# Patient Record
Sex: Female | Born: 1970
Health system: Southern US, Community
[De-identification: ages and names within clinical notes are randomized; demographics above are authoritative.]

## PROBLEM LIST (undated history)

## (undated) DIAGNOSIS — E785 Hyperlipidemia, unspecified: Secondary | ICD-10-CM

## (undated) DIAGNOSIS — I10 Essential (primary) hypertension: Secondary | ICD-10-CM

## (undated) DIAGNOSIS — I340 Nonrheumatic mitral (valve) insufficiency: Secondary | ICD-10-CM

## (undated) DIAGNOSIS — O1490 Unspecified pre-eclampsia, unspecified trimester: Secondary | ICD-10-CM

## (undated) DIAGNOSIS — I639 Cerebral infarction, unspecified: Secondary | ICD-10-CM

## (undated) DIAGNOSIS — I429 Cardiomyopathy, unspecified: Secondary | ICD-10-CM

## (undated) HISTORY — DX: Cardiomyopathy, unspecified: I42.9

## (undated) HISTORY — DX: Nonrheumatic mitral (valve) insufficiency: I34.0

## (undated) HISTORY — DX: Hyperlipidemia, unspecified: E78.5

## (undated) HISTORY — DX: Essential (primary) hypertension: I10

## (undated) HISTORY — DX: Cerebral infarction, unspecified: I63.9

---

## 1998-12-14 ENCOUNTER — Emergency Department (HOSPITAL_COMMUNITY): Admission: EM | Admit: 1998-12-14 | Discharge: 1998-12-14 | Payer: Self-pay | Admitting: Emergency Medicine

## 1998-12-15 ENCOUNTER — Emergency Department (HOSPITAL_COMMUNITY): Admission: EM | Admit: 1998-12-15 | Discharge: 1998-12-15 | Payer: Self-pay | Admitting: Emergency Medicine

## 2000-10-04 ENCOUNTER — Inpatient Hospital Stay (HOSPITAL_COMMUNITY): Admission: AD | Admit: 2000-10-04 | Discharge: 2000-10-04 | Payer: Self-pay | Admitting: *Deleted

## 2001-01-20 ENCOUNTER — Encounter: Payer: Self-pay | Admitting: *Deleted

## 2001-01-20 ENCOUNTER — Ambulatory Visit (HOSPITAL_COMMUNITY): Admission: RE | Admit: 2001-01-20 | Discharge: 2001-01-20 | Payer: Self-pay | Admitting: *Deleted

## 2001-05-28 ENCOUNTER — Inpatient Hospital Stay (HOSPITAL_COMMUNITY): Admission: AD | Admit: 2001-05-28 | Discharge: 2001-05-30 | Payer: Self-pay | Admitting: *Deleted

## 2001-05-28 ENCOUNTER — Encounter (INDEPENDENT_AMBULATORY_CARE_PROVIDER_SITE_OTHER): Payer: Self-pay

## 2004-06-22 ENCOUNTER — Emergency Department (HOSPITAL_COMMUNITY): Admission: EM | Admit: 2004-06-22 | Discharge: 2004-06-22 | Payer: Self-pay | Admitting: Emergency Medicine

## 2004-08-22 ENCOUNTER — Ambulatory Visit: Payer: Self-pay | Admitting: *Deleted

## 2009-10-08 ENCOUNTER — Emergency Department (HOSPITAL_COMMUNITY): Admission: EM | Admit: 2009-10-08 | Discharge: 2009-10-08 | Payer: Self-pay | Admitting: Emergency Medicine

## 2011-04-25 NOTE — H&P (Signed)
[facility] ([facility])  Patient:    Catherine Gross, Catherine Gross                         MRN: [id number] Adm. Date:  [id number] Disc. Date: [id number] Attending:  Deniece Ree                         History and Physical  HISTORY:                      The patient is a 40 year old multiparous female who is status post vaginal delivery and desires a permanent sterilization. The patient has discussed this permanent procedure throughout her pregnancy and is very sure this is what she desires. She understands the different types of contraceptives available. She also understands that this procedure is intended to be permanent, however, it cannot be guaranteed.  PHYSICAL EXAMINATION:  GENERAL:                      Well-developed, well-nourished postpartum female in no acute distress.  HEENT:                        Within normal limits.  NECK:                         Supple.  BREASTS:                      Without masses, tenderness, or discharge.  LUNGS:                        Clear to percussion and auscultation.  HEART:                        Normal sinus rhythm without murmur, rub, or gallop.  ABDOMEN:                      Benign with uterine fundus extending to the umbilicus.  EXTREMITIES:                  Within normal limits.  NEUROLOGIC:                   Within normal limits.  PELVIC:                       Not done.  DIAGNOSIS:                    Multiparity, desirous of a permanent sterilization.  PLAN:                         Bilateral tubal ligation using a modified Pomeroy procedure. DD:  05/29/01 TD:  05/30/01 Job: [id number]

## 2011-04-25 NOTE — Op Note (Signed)
[facility]  Patient:    Catherine Gross, Catherine Gross                         MRN: [id number] Proc. Date: 05/29/01 Adm. Date:  [id number] Disc. Date: [id number] Attending:  Deniece Ree                           Operative Report  PREOPERATIVE DIAGNOSIS:       Multiparity, desirous of permanent sterilization.  POSTOPERATIVE DIAGNOSIS:      Multiparity, desirous of permanent sterilization.  OPERATION:                    Bilateral tubal ligation using a modified Pomeroy procedure.  SURGEON:                      Deniece Ree, M.D.  ANESTHESIA:                   Epidural.  ESTIMATED BLOOD LOSS:         Less than 25 cc.  COMPLICATIONS:                None.  DISPOSITION:                  The patient tolerated the procedure well and returned to the recovery room in satisfactory condition.  DESCRIPTION OF PROCEDURE:     The patient was taken to the operating room, prepped and draped in the usual fashion for postpartum sterilization. A subumbilical incision was then made. This was carried down through the peritoneum and fascia. At this point, with some manipulation, the right tube was identified, grasped with the Babcock clamp, followed out until the fimbriated end could be identified. The portion of the tube was then knuckled up and utilizing a 0 plain catgut, ligated in a routine fashion without any problems. This was done likewise on the opposite side. Both tubal segments were then labeled and sent to pathology. Both tubal stump areas were then cauterized with the use of a cautery. At this point, hemostasis was present, sponge and needle count was correct x 2. The abdominoperitoneum and fascia was then closed using #1 chromic in a running stitch followed by closure of the skin using a 4-0 Vicryl in a subcuticular stitch. The procedure was terminated. The patient tolerated the procedure well and returned to the recovery room in satisfactory condition. DD:   05/29/01 TD:  05/30/01 Job: [id number]

## 2012-04-19 DIAGNOSIS — N183 Chronic kidney disease, stage 3 (moderate): Secondary | ICD-10-CM

## 2012-04-19 DIAGNOSIS — E1065 Type 1 diabetes mellitus with hyperglycemia: Secondary | ICD-10-CM

## 2012-04-19 DIAGNOSIS — I5031 Acute diastolic (congestive) heart failure: Secondary | ICD-10-CM

## 2012-04-19 DIAGNOSIS — E1029 Type 1 diabetes mellitus with other diabetic kidney complication: Secondary | ICD-10-CM

## 2012-04-19 DIAGNOSIS — J209 Acute bronchitis, unspecified: Secondary | ICD-10-CM

## 2014-09-02 ENCOUNTER — Emergency Department (HOSPITAL_BASED_OUTPATIENT_CLINIC_OR_DEPARTMENT_OTHER)
Admission: EM | Admit: 2014-09-02 | Discharge: 2014-09-02 | Disposition: A | Discharge status: Home / Self Care | Payer: Self-pay | Attending: Emergency Medicine | Admitting: Emergency Medicine

## 2014-09-02 ENCOUNTER — Encounter (HOSPITAL_BASED_OUTPATIENT_CLINIC_OR_DEPARTMENT_OTHER): Payer: Self-pay | Admitting: Emergency Medicine

## 2014-09-02 DIAGNOSIS — F17200 Nicotine dependence, unspecified, uncomplicated: Secondary | ICD-10-CM | POA: Insufficient documentation

## 2014-09-02 DIAGNOSIS — R220 Localized swelling, mass and lump, head: Secondary | ICD-10-CM | POA: Insufficient documentation

## 2014-09-02 DIAGNOSIS — K0889 Other specified disorders of teeth and supporting structures: Secondary | ICD-10-CM

## 2014-09-02 DIAGNOSIS — R221 Localized swelling, mass and lump, neck: Secondary | ICD-10-CM

## 2014-09-02 DIAGNOSIS — K089 Disorder of teeth and supporting structures, unspecified: Secondary | ICD-10-CM | POA: Insufficient documentation

## 2014-09-02 MED ORDER — PENICILLIN V POTASSIUM 500 MG PO TABS: 500.0000 mg | ORAL_TABLET | Freq: Four times a day (QID) | ORAL | Status: AC

## 2014-09-02 MED ORDER — IBUPROFEN 600 MG PO TABS: 600.0000 mg | ORAL_TABLET | Freq: Four times a day (QID) | ORAL | Status: DC | PRN

## 2014-09-02 MED ORDER — HYDROCODONE-ACETAMINOPHEN 5-325 MG PO TABS: 1.0000 | ORAL_TABLET | Freq: Four times a day (QID) | ORAL | Status: DC | PRN

## 2014-09-02 MED ORDER — IBUPROFEN 400 MG PO TABS
600.0000 mg | ORAL_TABLET | Freq: Once | ORAL | Status: AC
  Administered 2014-09-02: 600 mg via ORAL
  Filled 2014-09-02 (×2): qty 1

## 2014-09-02 NOTE — Discharge Instructions (Signed)
You appear to have peridontal disease and an exposed nerve root.  You need to follow-up with your dentist as soon as possible.  He will be given pain medication that contains Tylenol. You should not combine with other Tylenol products.  You also be given an antibiotic.  Dental Pain A tooth ache may be caused by cavities (tooth decay). Cavities expose the nerve of the tooth to air and hot or cold temperatures. It may come from an infection or abscess (also called a boil or furuncle) around your tooth. It is also often caused by dental caries (tooth decay). This causes the pain you are having. DIAGNOSIS  Your caregiver can diagnose this problem by exam. TREATMENT   If caused by an infection, it may be treated with medications which kill germs (antibiotics) and pain medications as prescribed by your caregiver. Take medications as directed.  Only take over-the-counter or prescription medicines for pain, discomfort, or fever as directed by your caregiver.  Whether the tooth ache today is caused by infection or dental disease, you should see your dentist as soon as possible for further care. SEEK MEDICAL CARE IF: The exam and treatment you received today has been provided on an emergency basis only. This is not a substitute for complete medical or dental care. If your problem worsens or new problems (symptoms) appear, and you are unable to meet with your dentist, call or return to this location. SEEK IMMEDIATE MEDICAL CARE IF:   You have a fever.  You develop redness and swelling of your face, jaw, or neck.  You are unable to open your mouth.  You have severe pain uncontrolled by pain medicine. MAKE SURE YOU:   Understand these instructions.  Will watch your condition.  Will get help right away if you are not doing well or get worse. Document Released: 11/24/2005 Document Revised: 02/16/2012 Document Reviewed: 07/12/2008 [facility] Patient Information 2015 [place], [place]. This information is  not intended to replace advice given to you by your health care provider. Make sure you discuss any questions you have with your health care provider.

## 2014-09-02 NOTE — ED Notes (Signed)
Pt presents to ED with complaints of left sided facial swelling and dental pain.

## 2014-09-02 NOTE — ED Provider Notes (Signed)
CSN: [id number]     Arrival date & time 09/02/14  1110 History   First MD Initiated Contact with Patient 09/02/14 1127     Chief Complaint  Patient presents with  . Facial Swelling  . Dental Pain     (Consider location/radiation/quality/duration/timing/severity/associated sxs/prior Treatment) HPI  This is a 43 year old female with history smoking and poor dentition requiring extraction of all of her lower teeth who presents with toothache. Patient reports to 3 day history of worsening dental pain. She states that she woke up this morning and had left facial swelling. She denies any fevers or systemic symptoms. She's been taking Tylenol at home which she reports improved her pain.  Currently she reports that she is "okay."  She was concerned for abscess given facial swelling. She denies any sore throat or difficulty breathing.  History reviewed. No pertinent past medical history. History reviewed. No pertinent past surgical history. No family history on file. History  Substance Use Topics  . Smoking status: Current Every Day Smoker  . Smokeless tobacco: Not on file  . Alcohol Use: Not on file   OB History   Grav Para Term Preterm Abortions TAB SAB Ect Mult Living                 Review of Systems  Constitutional: Negative for fever.  HENT: Positive for dental problem. Negative for sore throat, trouble swallowing and voice change.   All other systems reviewed and are negative.     Allergies  Review of patient's allergies indicates no known allergies.  Home Medications   Prior to Admission medications   Medication Sig Start Date End Date Taking? Authorizing Provider  HYDROcodone-acetaminophen (NORCO/VICODIN) 5-325 MG per tablet Take 1 tablet by mouth every 6 (six) hours as needed for moderate pain or severe pain. 09/02/14   Shon Baton, MD  ibuprofen (ADVIL,MOTRIN) 600 MG tablet Take 1 tablet (600 mg total) by mouth every 6 (six) hours as needed for moderate pain.  09/02/14   Shon Baton, MD  penicillin v potassium (VEETID) 500 MG tablet Take 1 tablet (500 mg total) by mouth 4 (four) times daily. 09/02/14 09/09/14  Shon Baton, MD   BP 171/99  Pulse 102  Temp(Src) 98.9 F (37.2 C) (Oral)  Resp 18  Ht 5\' 5"  (1.651 m)  Wt 180 lb (81.647 kg)  BMI 29.95 kg/m2  SpO2 98%  LMP 08/26/2014 Physical Exam  Nursing note and vitals reviewed. Constitutional: She is oriented to person, place, and time. She appears well-developed and well-nourished.  HENT:  Head: Normocephalic and atraumatic.  Mouth/Throat: Oropharynx is clear and moist.  Mild swelling noted over the zygomatic region the face without overlying skin changes, peritonsillar disease noted over the upper, with extensive exposure of the nerve root of tooth 11 (left cuspid), no obvious abscess, no dentures over the lower gumline, no trismus, normal movement of tongue  Eyes: Pupils are equal, round, and reactive to light.  Neck: Neck supple.  Cardiovascular: Normal rate and regular rhythm.   Pulmonary/Chest: Effort normal. No respiratory distress.  Abdominal: Soft.  Lymphadenopathy:    She has no cervical adenopathy.  Neurological: She is alert and oriented to person, place, and time.  Skin: Skin is warm and dry.  Psychiatric: She has a normal mood and affect.    ED Course  Procedures (including critical care time) Labs Review Labs Reviewed - No data to display  Imaging Review No results found.   EKG Interpretation None  MDM   Final diagnoses:  Pain, dental    Patient presents with dental pain. No obvious abscess. Pain is well controlled at this time with over-the-counter pain medication. No evidence of Ludwig's or airway compromise. Patient has extensive.disease with exposure of the nerve root worse over tooth 11. Patient has attentive she can followup with. We'll place on penicillin. Encouraged patient to use ibuprofen for pain control. Will also give a short course of  Norco for breakthrough pain. Patient was instructed not to combine with other Tylenol containing products.  After history, exam, and medical workup I feel the patient has been appropriately medically screened and is safe for discharge home. Pertinent diagnoses were discussed with the patient. Patient was given return precautions.     Shon Baton, MD 09/02/14 1145

## 2015-03-21 ENCOUNTER — Inpatient Hospital Stay (HOSPITAL_COMMUNITY)
Admission: EM | Admit: 2015-03-21 | Discharge: 2015-03-30 | DRG: 291 | Disposition: A | Discharge status: Home / Self Care | Payer: Medicaid Other | Attending: Internal Medicine | Admitting: Internal Medicine

## 2015-03-21 ENCOUNTER — Encounter (HOSPITAL_COMMUNITY): Payer: Self-pay

## 2015-03-21 DIAGNOSIS — I5021 Acute systolic (congestive) heart failure: Secondary | ICD-10-CM

## 2015-03-21 DIAGNOSIS — I5043 Acute on chronic combined systolic (congestive) and diastolic (congestive) heart failure: Principal | ICD-10-CM | POA: Diagnosis present

## 2015-03-21 DIAGNOSIS — D649 Anemia, unspecified: Secondary | ICD-10-CM | POA: Insufficient documentation

## 2015-03-21 DIAGNOSIS — I272 Other secondary pulmonary hypertension: Secondary | ICD-10-CM | POA: Diagnosis present

## 2015-03-21 DIAGNOSIS — D509 Iron deficiency anemia, unspecified: Secondary | ICD-10-CM | POA: Diagnosis present

## 2015-03-21 DIAGNOSIS — I5041 Acute combined systolic (congestive) and diastolic (congestive) heart failure: Secondary | ICD-10-CM

## 2015-03-21 DIAGNOSIS — R05 Cough: Secondary | ICD-10-CM | POA: Diagnosis present

## 2015-03-21 DIAGNOSIS — I5031 Acute diastolic (congestive) heart failure: Secondary | ICD-10-CM

## 2015-03-21 DIAGNOSIS — E876 Hypokalemia: Secondary | ICD-10-CM | POA: Diagnosis present

## 2015-03-21 DIAGNOSIS — F17210 Nicotine dependence, cigarettes, uncomplicated: Secondary | ICD-10-CM | POA: Diagnosis present

## 2015-03-21 DIAGNOSIS — I10 Essential (primary) hypertension: Secondary | ICD-10-CM | POA: Diagnosis present

## 2015-03-21 DIAGNOSIS — I420 Dilated cardiomyopathy: Secondary | ICD-10-CM | POA: Diagnosis present

## 2015-03-21 DIAGNOSIS — J9601 Acute respiratory failure with hypoxia: Secondary | ICD-10-CM | POA: Diagnosis present

## 2015-03-21 DIAGNOSIS — T501X5A Adverse effect of loop [high-ceiling] diuretics, initial encounter: Secondary | ICD-10-CM | POA: Diagnosis present

## 2015-03-21 DIAGNOSIS — I340 Nonrheumatic mitral (valve) insufficiency: Secondary | ICD-10-CM | POA: Diagnosis present

## 2015-03-21 DIAGNOSIS — I509 Heart failure, unspecified: Secondary | ICD-10-CM

## 2015-03-21 DIAGNOSIS — R000 Tachycardia, unspecified: Secondary | ICD-10-CM | POA: Diagnosis present

## 2015-03-21 DIAGNOSIS — R059 Cough, unspecified: Secondary | ICD-10-CM | POA: Diagnosis present

## 2015-03-21 HISTORY — DX: Unspecified pre-eclampsia, unspecified trimester: O14.90

## 2015-03-21 NOTE — ED Notes (Signed)
Pt complains of a dry cough for a week, only been treated with OTC medications with no relief

## 2015-03-22 ENCOUNTER — Encounter (HOSPITAL_COMMUNITY): Payer: Self-pay | Admitting: Emergency Medicine

## 2015-03-22 ENCOUNTER — Emergency Department (HOSPITAL_COMMUNITY): Payer: Medicaid Other

## 2015-03-22 DIAGNOSIS — I5041 Acute combined systolic (congestive) and diastolic (congestive) heart failure: Secondary | ICD-10-CM | POA: Diagnosis not present

## 2015-03-22 DIAGNOSIS — F17210 Nicotine dependence, cigarettes, uncomplicated: Secondary | ICD-10-CM | POA: Diagnosis present

## 2015-03-22 DIAGNOSIS — D649 Anemia, unspecified: Secondary | ICD-10-CM | POA: Diagnosis not present

## 2015-03-22 DIAGNOSIS — I509 Heart failure, unspecified: Secondary | ICD-10-CM

## 2015-03-22 DIAGNOSIS — I340 Nonrheumatic mitral (valve) insufficiency: Secondary | ICD-10-CM | POA: Diagnosis present

## 2015-03-22 DIAGNOSIS — E876 Hypokalemia: Secondary | ICD-10-CM | POA: Diagnosis present

## 2015-03-22 DIAGNOSIS — I10 Essential (primary) hypertension: Secondary | ICD-10-CM

## 2015-03-22 DIAGNOSIS — D509 Iron deficiency anemia, unspecified: Secondary | ICD-10-CM | POA: Diagnosis present

## 2015-03-22 DIAGNOSIS — R05 Cough: Secondary | ICD-10-CM | POA: Diagnosis not present

## 2015-03-22 DIAGNOSIS — I5031 Acute diastolic (congestive) heart failure: Secondary | ICD-10-CM

## 2015-03-22 DIAGNOSIS — I429 Cardiomyopathy, unspecified: Secondary | ICD-10-CM | POA: Diagnosis not present

## 2015-03-22 DIAGNOSIS — I5043 Acute on chronic combined systolic (congestive) and diastolic (congestive) heart failure: Secondary | ICD-10-CM | POA: Diagnosis present

## 2015-03-22 DIAGNOSIS — I5021 Acute systolic (congestive) heart failure: Secondary | ICD-10-CM | POA: Diagnosis not present

## 2015-03-22 DIAGNOSIS — J9601 Acute respiratory failure with hypoxia: Secondary | ICD-10-CM | POA: Diagnosis not present

## 2015-03-22 DIAGNOSIS — R059 Cough, unspecified: Secondary | ICD-10-CM | POA: Diagnosis present

## 2015-03-22 DIAGNOSIS — I272 Other secondary pulmonary hypertension: Secondary | ICD-10-CM | POA: Diagnosis present

## 2015-03-22 DIAGNOSIS — R000 Tachycardia, unspecified: Secondary | ICD-10-CM | POA: Diagnosis present

## 2015-03-22 DIAGNOSIS — I420 Dilated cardiomyopathy: Secondary | ICD-10-CM | POA: Diagnosis present

## 2015-03-22 DIAGNOSIS — I471 Supraventricular tachycardia: Secondary | ICD-10-CM | POA: Diagnosis not present

## 2015-03-22 DIAGNOSIS — T501X5A Adverse effect of loop [high-ceiling] diuretics, initial encounter: Secondary | ICD-10-CM | POA: Diagnosis present

## 2015-03-22 LAB — CBC WITH DIFFERENTIAL/PLATELET
BASOS PCT: 0 % (ref 0–1)
Basophils Absolute: 0 10*3/uL (ref 0.0–0.1)
EOS ABS: 0.3 10*3/uL (ref 0.0–0.7)
Eosinophils Relative: 3 % (ref 0–5)
HCT: 34.4 % — ABNORMAL LOW (ref 36.0–46.0)
Hemoglobin: 10.9 g/dL — ABNORMAL LOW (ref 12.0–15.0)
Lymphocytes Relative: 26 % (ref 12–46)
Lymphs Abs: 2.4 10*3/uL (ref 0.7–4.0)
MCH: 27 pg (ref 26.0–34.0)
MCHC: 31.7 g/dL (ref 30.0–36.0)
MCV: 85.4 fL (ref 78.0–100.0)
MONO ABS: 0.5 10*3/uL (ref 0.1–1.0)
Monocytes Relative: 6 % (ref 3–12)
Neutro Abs: 5.9 10*3/uL (ref 1.7–7.7)
Neutrophils Relative %: 65 % (ref 43–77)
Platelets: 341 10*3/uL (ref 150–400)
RBC: 4.03 MIL/uL (ref 3.87–5.11)
RDW: 17.2 % — ABNORMAL HIGH (ref 11.5–15.5)
WBC: 9 10*3/uL (ref 4.0–10.5)

## 2015-03-22 LAB — COMPREHENSIVE METABOLIC PANEL
ALT: 11 U/L (ref 0–35)
ANION GAP: 5 (ref 5–15)
AST: 14 U/L (ref 0–37)
Albumin: 3.6 g/dL (ref 3.5–5.2)
Alkaline Phosphatase: 46 U/L (ref 39–117)
BUN: 9 mg/dL (ref 6–23)
CALCIUM: 8.1 mg/dL — AB (ref 8.4–10.5)
CO2: 21 mmol/L (ref 19–32)
Chloride: 112 mmol/L (ref 96–112)
Creatinine, Ser: 0.73 mg/dL (ref 0.50–1.10)
GLUCOSE: 118 mg/dL — AB (ref 70–99)
Potassium: 3.1 mmol/L — ABNORMAL LOW (ref 3.5–5.1)
Sodium: 138 mmol/L (ref 135–145)
Total Bilirubin: 0.5 mg/dL (ref 0.3–1.2)
Total Protein: 7.2 g/dL (ref 6.0–8.3)

## 2015-03-22 LAB — CBC
HEMATOCRIT: 28.9 % — AB (ref 36.0–46.0)
Hemoglobin: 9.2 g/dL — ABNORMAL LOW (ref 12.0–15.0)
MCH: 27.1 pg (ref 26.0–34.0)
MCHC: 31.8 g/dL (ref 30.0–36.0)
MCV: 85.3 fL (ref 78.0–100.0)
Platelets: 272 10*3/uL (ref 150–400)
RBC: 3.39 MIL/uL — AB (ref 3.87–5.11)
RDW: 17.4 % — ABNORMAL HIGH (ref 11.5–15.5)
WBC: 7.2 10*3/uL (ref 4.0–10.5)

## 2015-03-22 LAB — PROTIME-INR
INR: 1.03 (ref 0.00–1.49)
Prothrombin Time: 13.6 seconds (ref 11.6–15.2)

## 2015-03-22 LAB — I-STAT CHEM 8, ED
BUN: 8 mg/dL (ref 6–23)
Calcium, Ion: 1.22 mmol/L (ref 1.12–1.23)
Chloride: 106 mmol/L (ref 96–112)
Creatinine, Ser: 0.7 mg/dL (ref 0.50–1.10)
Glucose, Bld: 129 mg/dL — ABNORMAL HIGH (ref 70–99)
HEMATOCRIT: 37 % (ref 36.0–46.0)
HEMOGLOBIN: 12.6 g/dL (ref 12.0–15.0)
POTASSIUM: 3.4 mmol/L — AB (ref 3.5–5.1)
SODIUM: 143 mmol/L (ref 135–145)
TCO2: 21 mmol/L (ref 0–100)

## 2015-03-22 LAB — BRAIN NATRIURETIC PEPTIDE: B NATRIURETIC PEPTIDE 5: 373.9 pg/mL — AB (ref 0.0–100.0)

## 2015-03-22 LAB — RAPID URINE DRUG SCREEN, HOSP PERFORMED
AMPHETAMINES: NOT DETECTED
BENZODIAZEPINES: NOT DETECTED
Barbiturates: NOT DETECTED
COCAINE: NOT DETECTED
OPIATES: NOT DETECTED
TETRAHYDROCANNABINOL: NOT DETECTED

## 2015-03-22 LAB — TROPONIN I
TROPONIN I: 0.03 ng/mL (ref <0.031)
Troponin I: 0.03 ng/mL (ref <0.031)
Troponin I: <0.03 ng/mL (ref <0.031)

## 2015-03-22 LAB — I-STAT TROPONIN, ED: Troponin i, poc: 0.01 ng/mL (ref 0.00–0.08)

## 2015-03-22 LAB — POC URINE PREG, ED: Preg Test, Ur: NEGATIVE

## 2015-03-22 LAB — TSH: TSH: 1.201 u[IU]/mL (ref 0.350–4.500)

## 2015-03-22 MED ORDER — HEPARIN SODIUM (PORCINE) 5000 UNIT/ML IJ SOLN
5000.0000 [IU] | Freq: Three times a day (TID) | INTRAMUSCULAR | Status: DC
  Administered 2015-03-22 – 2015-03-30 (×20): 5000 [IU] via SUBCUTANEOUS
  Filled 2015-03-22 (×27): qty 1

## 2015-03-22 MED ORDER — FUROSEMIDE 10 MG/ML IJ SOLN
20.0000 mg | Freq: Once | INTRAMUSCULAR | Status: AC
  Administered 2015-03-22: 20 mg via INTRAVENOUS

## 2015-03-22 MED ORDER — ONDANSETRON HCL 4 MG PO TABS
4.0000 mg | ORAL_TABLET | Freq: Four times a day (QID) | ORAL | Status: DC | PRN
  Administered 2015-03-22: 4 mg via ORAL
  Filled 2015-03-22: qty 1

## 2015-03-22 MED ORDER — SODIUM CHLORIDE 0.9 % IV BOLUS (SEPSIS)
1000.0000 mL | Freq: Once | INTRAVENOUS | Status: AC
  Administered 2015-03-22: 1000 mL via INTRAVENOUS

## 2015-03-22 MED ORDER — ALBUTEROL SULFATE (2.5 MG/3ML) 0.083% IN NEBU
5.0000 mg | INHALATION_SOLUTION | Freq: Once | RESPIRATORY_TRACT | Status: AC
  Administered 2015-03-22: 5 mg via RESPIRATORY_TRACT
  Filled 2015-03-22: qty 6

## 2015-03-22 MED ORDER — ACETAMINOPHEN 650 MG RE SUPP
650.0000 mg | Freq: Four times a day (QID) | RECTAL | Status: DC | PRN
  Filled 2015-03-22: qty 1

## 2015-03-22 MED ORDER — POTASSIUM CHLORIDE CRYS ER 20 MEQ PO TBCR
40.0000 meq | EXTENDED_RELEASE_TABLET | Freq: Once | ORAL | Status: AC
  Administered 2015-03-22: 40 meq via ORAL
  Filled 2015-03-22: qty 2

## 2015-03-22 MED ORDER — CARVEDILOL 3.125 MG PO TABS
3.1250 mg | ORAL_TABLET | Freq: Two times a day (BID) | ORAL | Status: DC
Start: 2015-03-22 — End: 2015-03-23
  Administered 2015-03-22 – 2015-03-23 (×2): 3.125 mg via ORAL
  Filled 2015-03-22 (×2): qty 1

## 2015-03-22 MED ORDER — IPRATROPIUM-ALBUTEROL 0.5-2.5 (3) MG/3ML IN SOLN: 3.0000 mL | Freq: Four times a day (QID) | RESPIRATORY_TRACT | Status: DC | PRN

## 2015-03-22 MED ORDER — NITROGLYCERIN 2 % TD OINT
1.0000 [in_us] | TOPICAL_OINTMENT | Freq: Once | TRANSDERMAL | Status: AC
  Administered 2015-03-22: 1 [in_us] via TOPICAL
  Filled 2015-03-22: qty 30

## 2015-03-22 MED ORDER — METOPROLOL TARTRATE 25 MG PO TABS
25.0000 mg | ORAL_TABLET | Freq: Two times a day (BID) | ORAL | Status: DC
Start: 2015-03-22 — End: 2015-03-22
  Administered 2015-03-22: 25 mg via ORAL
  Filled 2015-03-22: qty 1

## 2015-03-22 MED ORDER — FUROSEMIDE 10 MG/ML IJ SOLN
40.0000 mg | Freq: Once | INTRAMUSCULAR | Status: DC
  Filled 2015-03-22: qty 4

## 2015-03-22 MED ORDER — FUROSEMIDE 10 MG/ML IJ SOLN
40.0000 mg | Freq: Once | INTRAMUSCULAR | Status: AC
  Administered 2015-03-22: 40 mg via INTRAVENOUS
  Filled 2015-03-22: qty 4

## 2015-03-22 MED ORDER — BENZONATATE 100 MG PO CAPS
100.0000 mg | ORAL_CAPSULE | Freq: Three times a day (TID) | ORAL | Status: DC
  Administered 2015-03-22 – 2015-03-30 (×25): 100 mg via ORAL
  Filled 2015-03-22 (×29): qty 1

## 2015-03-22 MED ORDER — SODIUM CHLORIDE 0.9 % IJ SOLN
3.0000 mL | Freq: Two times a day (BID) | INTRAMUSCULAR | Status: DC
  Administered 2015-03-22 – 2015-03-29 (×12): 3 mL via INTRAVENOUS

## 2015-03-22 MED ORDER — HYDRALAZINE HCL 50 MG PO TABS
50.0000 mg | ORAL_TABLET | Freq: Four times a day (QID) | ORAL | Status: DC
  Administered 2015-03-22 – 2015-03-29 (×26): 50 mg via ORAL
  Filled 2015-03-22 (×30): qty 1

## 2015-03-22 MED ORDER — ZOLPIDEM TARTRATE 5 MG PO TABS
5.0000 mg | ORAL_TABLET | Freq: Every evening | ORAL | Status: DC | PRN
  Administered 2015-03-23 – 2015-03-30 (×4): 5 mg via ORAL
  Filled 2015-03-22 (×5): qty 1

## 2015-03-22 MED ORDER — MENTHOL 3 MG MT LOZG
1.0000 | LOZENGE | OROMUCOSAL | Status: DC | PRN
  Filled 2015-03-22: qty 9

## 2015-03-22 MED ORDER — HYDRALAZINE HCL 20 MG/ML IJ SOLN
10.0000 mg | INTRAMUSCULAR | Status: DC | PRN
  Administered 2015-03-23 – 2015-03-24 (×2): 10 mg via INTRAVENOUS
  Filled 2015-03-22 (×2): qty 1

## 2015-03-22 MED ORDER — CLONIDINE HCL 0.1 MG PO TABS: 0.1000 mg | ORAL_TABLET | Freq: Every day | ORAL | Status: DC

## 2015-03-22 MED ORDER — FUROSEMIDE 10 MG/ML IJ SOLN: 40.0000 mg | Freq: Every day | INTRAMUSCULAR | Status: DC

## 2015-03-22 MED ORDER — ACETAMINOPHEN 325 MG PO TABS
650.0000 mg | ORAL_TABLET | Freq: Four times a day (QID) | ORAL | Status: DC | PRN
  Administered 2015-03-22 – 2015-03-24 (×6): 650 mg via ORAL
  Filled 2015-03-22 (×7): qty 2

## 2015-03-22 MED ORDER — HYDRALAZINE HCL 25 MG PO TABS
25.0000 mg | ORAL_TABLET | Freq: Four times a day (QID) | ORAL | Status: DC
  Administered 2015-03-22: 25 mg via ORAL
  Filled 2015-03-22 (×6): qty 1

## 2015-03-22 MED ORDER — ONDANSETRON HCL 4 MG/2ML IJ SOLN
4.0000 mg | Freq: Four times a day (QID) | INTRAMUSCULAR | Status: DC | PRN
  Administered 2015-03-22 – 2015-03-23 (×2): 4 mg via INTRAVENOUS
  Filled 2015-03-22 (×3): qty 2

## 2015-03-22 MED ORDER — KETOROLAC TROMETHAMINE 30 MG/ML IJ SOLN
30.0000 mg | Freq: Once | INTRAMUSCULAR | Status: AC
  Administered 2015-03-22: 30 mg via INTRAVENOUS
  Filled 2015-03-22: qty 1

## 2015-03-22 MED ORDER — IPRATROPIUM BROMIDE 0.02 % IN SOLN
0.5000 mg | Freq: Once | RESPIRATORY_TRACT | Status: AC
  Administered 2015-03-22: 0.5 mg via RESPIRATORY_TRACT
  Filled 2015-03-22: qty 2.5

## 2015-03-22 NOTE — H&P (Signed)
[facility] History and Physical  Patient: Catherine Gross  MRN: [id number]  DOB: [birth year]  DOS: the patient was seen and examined on 03/22/2015 PCP: No PCP Per Patient  Chief Complaint: Congestion  HPI: Catherine Gross is a 44 y.o. female with Past medical history of preeclampsia. The patient is presenting with complaints of cough and congestion. Patient mentions that since Monday she has been having progressively worsening cough which is dry. She also complains of orthopnea and PND. She denies any fever or chills. She has been using over-the-counter supplements Tylenol severe for the cough. She denies any choking episodes recent travel or acid reflux. She denies any nausea or vomiting. No complaints of abdominal pain diarrhea or constipation or burning urination. She has some swelling but no leg tenderness. She has recently driven to [facility], no other recent long travel. She does not take any prescription medications. She is an active smoker smokes half pack a day. She denies any significant alcohol abuse but occasionally drinks 2-3 [place] cocktail over weekend and she is out socially. Denies any drug abuse.  The patient is coming from home. And at her baseline independent for most of her ADL.  Review of Systems: as mentioned in the history of present illness.  A comprehensive review of the other systems is negative.  Past Medical History  Diagnosis Date  . Preeclampsia     first pregnancy, not with susequent pregnancy   History reviewed. No pertinent past surgical history. Social History:  reports that she has been smoking.  She does not have any smokeless tobacco history on file. She reports that she drinks alcohol. Her drug history is not on file.  No Known Allergies  Family History  Problem Relation Age of Onset  . Heart failure Father   . Hypertension Father     Prior to Admission medications   Medication Sig Start Date End Date Taking?  Authorizing Provider  guaiFENesin (MUCINEX) 600 MG 12 hr tablet Take 600 mg by mouth 2 (two) times daily as needed for cough.   Yes Historical Provider, MD  ibuprofen (ADVIL,MOTRIN) 600 MG tablet Take 1 tablet (600 mg total) by mouth every 6 (six) hours as needed for moderate pain. 09/02/14  Yes Shon Baton, MD  pseudoephedrine-acetaminophen (TYLENOL SINUS) 30-500 MG TABS Take 2 tablets by mouth every 4 (four) hours as needed (cold symptoms).   Yes Historical Provider, MD  HYDROcodone-acetaminophen (NORCO/VICODIN) 5-325 MG per tablet Take 1 tablet by mouth every 6 (six) hours as needed for moderate pain or severe pain. Patient not taking: Reported on 03/22/2015 09/02/14   Shon Baton, MD    Physical Exam: Filed Vitals:   03/22/15 0400 03/22/15 0430 03/22/15 0500 03/22/15 0552  BP: 162/98 157/106 160/106 159/100  Pulse: 122 119 120 112  Temp:    97.9 F (36.6 C)  TempSrc:    Oral  Resp: 23 30 29 24   Height:    5\' 5"  (1.651 m)  Weight:    82.963 kg (182 lb 14.4 oz)  SpO2: 92% 89% 91% 98%    General: Alert, Awake and Oriented to Time, Place and Person. Appear in mild distress Eyes: PERRL ENT: Oral Mucosa clear moist. Neck: Mild JVD Cardiovascular: S1 and S2 Present, no Murmur, Peripheral Pulses Present Respiratory: Bilateral Air entry equal and Decreased,  Bilateral basal Crackles, no wheezes Abdomen: Bowel Sound present, Soft and non tender Skin: no Rash Extremities: Trace Pedal edema, no calf tenderness Neurologic: Grossly no focal neuro  deficit.  Labs on Admission:  CBC:  Recent Labs Lab 03/22/15 0040 03/22/15 0049 03/22/15 0523  WBC 9.0  --  7.2  NEUTROABS 5.9  --   --   HGB 10.9* 12.6 9.2*  HCT 34.4* 37.0 28.9*  MCV 85.4  --  85.3  PLT 341  --  272    CMP     Component Value Date/Time   NA 138 03/22/2015 0523   K 3.1* 03/22/2015 0523   CL 112 03/22/2015 0523   CO2 21 03/22/2015 0523   GLUCOSE 118* 03/22/2015 0523   BUN 9 03/22/2015 0523    CREATININE 0.73 03/22/2015 0523   CALCIUM 8.1* 03/22/2015 0523   PROT 7.2 03/22/2015 0523   ALBUMIN 3.6 03/22/2015 0523   AST 14 03/22/2015 0523   ALT 11 03/22/2015 0523   ALKPHOS 46 03/22/2015 0523   BILITOT 0.5 03/22/2015 0523   GFRNONAA >90 03/22/2015 0523   GFRAA >90 03/22/2015 0523    No results for input(s): LIPASE, AMYLASE in the last 168 hours.   Recent Labs Lab 03/22/15 0523  TROPONINI 0.03   BNP (last 3 results)  Recent Labs  03/22/15 0040  BNP 373.9*    ProBNP (last 3 results) No results for input(s): PROBNP in the last 8760 hours.   Radiological Exams on Admission: Dg Chest 2 View  03/22/2015   CLINICAL DATA:  Cough.  Unable to sleep.  EXAM: CHEST  2 VIEW  COMPARISON:  06/22/2004  FINDINGS: There is cardiomegaly and vascular pedicle widening. Diffuse interstitial opacities with cephalized blood flow. No pleural effusion or pneumothorax.  IMPRESSION: CHF pattern.   Electronically Signed   By: Marnee Spring M.D.   On: 03/22/2015 03:06   EKG: Independently reviewed. nonspecific ST and T waves changes, sinus tachycardia.  Assessment/Plan Principal Problem:   Acute CHF Active Problems:   Accelerated hypertension   Cough   Sinus tachycardia   Hypokalemia   1. Acute CHF The patient is presenting with complaints of cough with signs of orthopnea and PND. She has elevated BNP and a chest x-ray showing possible CHF. She has mild JVD. And she has bibasilar crackles. This all findings suggest also will acute CHF most likely diastolic dysfunction. Probable etiology is accelerated hypertension. And probable etiology of accelerated hypertension is ongoing use of pseudoephedrine. Patient will be given Lopressor and hydralazine and nitroglycerin. We will follow serial troponin. Check echocardiogram in the morning. Monitored on telemetry. Patient remains nothing by mouth except medications at present.  2.Cough. She has a dry cough chest x-ray does not show any  evidence of pneumonia. She does not have any significant leukocytosis nor she has any hypoxia. Possibility of bronchitis cannot be ruled out but does not appear to be significantly likely. Closely monitor.  3. accelerated hypertension. Most likely she has chronic uncontrolled hypertension which is worsening at present secondary to use of medications. We will add beta blocker as well as hydralazine and nitroglycerin.  4. hypokalemia. Patient has hypokalemia with hypertension. Possibility of primary aldosteronism cannot be ruled out. Patient may require further workup for secondary causes of hypertension as an outpatient. Currently replacing potassium level.  DVT Prophylaxis: subcutaneous Heparin Nutrition: Nothing by mouth except medications  Family Communication: family was present at bedside, opportunity was given to ask question and all questions were answered satisfactorily at the time of interview. Disposition: Admitted as inpatient, telemetry unit.  Author: Lynden Oxford, MD [facility] Pager: [phone] 03/22/2015  If 7PM-7AM, please contact night-coverage [email or web address] Password  [email or web address]

## 2015-03-22 NOTE — Consult Note (Signed)
CARDIOLOGY CONSULT NOTE   Patient ID: Catherine Gross MRN: [id number], DOB/AGE: 04-04-1971   Admit date: 03/21/2015 Date of Consult: 03/22/2015   Primary Physician: No PCP Per Patient Primary Cardiologist: None  Pt. Profile  44 year old woman admitted with CHF.  Problem List  Past Medical History  Diagnosis Date  . Preeclampsia     first pregnancy, not with susequent pregnancy    History reviewed. No pertinent past surgical history.   Allergies  No Known Allergies  HPI   This 44 year old African-American woman was admitted last evening with complaints of 10 days of worsening exertional dyspnea associated with cough.  She has also had orthopnea and paroxysmal nocturnal dyspnea.  She has been treating herself with over-the-counter medications including Tylenol.  She has also been taking Sudafed.  She has not been experiencing any chest pain to suggest angina.  She has had some right-sided chest discomfort of a musculoskeletal nature.  She is not on any blood pressure medication at home.  She does not see a physician on a regular basis.  About 20 years ago she had preeclampsia with pregnancy.  She was treated for a while after that with blood pressure medication which was then stopped when her blood pressure remained normal.  The patient does admit to eating a lot of salt in her diet.  She drinks moderate caffeine.  She drinks occasional alcohol but not heavily.  She denies any illicit drugs such as cocaine In the emergency room her chest x-ray showed cardiomegaly and radiologic evidence of CHF.  Her brain natretic peptide is elevated at 373.  Troponins are normal.  She is mildly anemic with hemoglobin 9.2.  Inpatient Medications  . benzonatate  100 mg Oral TID  . heparin  5,000 Units Subcutaneous 3 times per day  . hydrALAZINE  50 mg Oral 4 times per day  . sodium chloride  3 mL Intravenous Q12H    Family History Family History  Problem Relation Age of Onset  . Heart failure  Father   . Hypertension Father      Social History History   Social History  . Marital Status: Married    Spouse Name: N/A  . Number of Children: N/A  . Years of Education: N/A   Occupational History  . Not on file.   Social History Main Topics  . Smoking status: Current Every Day Smoker -- 0.50 packs/day for 20 years  . Smokeless tobacco: Not on file  . Alcohol Use: Yes  . Drug Use: Not on file  . Sexual Activity: Not on file   Other Topics Concern  . Not on file   Social History Narrative     Review of Systems  General:  No chills, fever, night sweats or weight changes.  Cardiovascular:  Positive for exertional dyspnea.  No peripheral edema. Dermatological: No rash, lesions/masses Respiratory: No cough, dyspnea Urologic: No hematuria, dysuria Abdominal:   No nausea, vomiting, diarrhea, bright red blood per rectum, melena, or hematemesis Neurologic:  No visual changes, wkns, changes in mental status. All other systems reviewed and are otherwise negative except as noted above.  Physical Exam  Blood pressure 154/98, pulse 111, temperature 97.5 F (36.4 C), temperature source Oral, resp. rate 20, height 5\' 5"  (1.651 m), weight 182 lb 14.4 oz (82.963 kg), last menstrual period 02/27/2015, SpO2 96 %.  General: Pleasant, NAD Psych: Normal affect. Neuro: Alert and oriented X 3. Moves all extremities spontaneously. HEENT: Normal  Neck: Supple without bruits or  JVD. Lungs:  Resp regular and unlabored, bibasilar rales Heart: Sinus tachycardia.  There is an S3 gallop.  There is a grade 2/6 apical systolic murmur.  There is no pericardial rub Abdomen: Soft, non-tender, non-distended, BS + x 4.  Extremities: No clubbing, cyanosis or edema. DP/PT/Radials 2+ and equal bilaterally.  Labs   Recent Labs  03/22/15 0523 03/22/15 1040  TROPONINI 0.03 0.03   Lab Results  Component Value Date   WBC 7.2 03/22/2015   HGB 9.2* 03/22/2015   HCT 28.9* 03/22/2015   MCV 85.3  03/22/2015   PLT 272 03/22/2015     Recent Labs Lab 03/22/15 0523  NA 138  K 3.1*  CL 112  CO2 21  BUN 9  CREATININE 0.73  CALCIUM 8.1*  PROT 7.2  BILITOT 0.5  ALKPHOS 46  ALT 11  AST 14  GLUCOSE 118*   No results found for: CHOL, HDL, LDLCALC, TRIG No results found for: DDIMER  Radiology/Studies  Dg Chest 2 View  03/22/2015   CLINICAL DATA:  Cough.  Unable to sleep.  EXAM: CHEST  2 VIEW  COMPARISON:  06/22/2004  FINDINGS: There is cardiomegaly and vascular pedicle widening. Diffuse interstitial opacities with cephalized blood flow. No pleural effusion or pneumothorax.  IMPRESSION: CHF pattern.   Electronically Signed   By: Marnee Spring M.D.   On: 03/22/2015 03:06    ECG sinus tachycardia.  Voltage for LVH.  No acute ischemic changes. 2-D echo: Pending  ASSESSMENT AND PLAN 1.  Recent onset of symptoms of CHF progressive over the past 10 days or so.  I suspect her symptoms are secondary to combined systolic and diastolic dysfunction secondary to possible long-standing unrecognized hypertension as well as dietary salt indiscretion. Echocardiogram should be helpful in this regard. Her sinus tachycardia may also reflect the recent ingestion of Sudafed.  She has also been taking ibuprofen could be contributing to her pulmonary congestion and fluid retention 2.  Anemia, etiology unknown.  Workup in progress per primary team 3.  Hypokalemia.  Agree with possibility of primary aldosteronism since she has not been on any drugs known to cause hypokalemia.  Recommendation: She has been given an initial dose of Lasix already.  Her potassium is being replenished.  We will await results of two-dimensional echocardiogram and then consider addition of ACE inhibitor after plasma renin and aldosterone activity is drawn tomorrow morning.  Will start low dose carvedilol for hypertension and tachycardia. Will follow with you Signed, Cassell Clement, MD  03/22/2015, 3:02 PM

## 2015-03-22 NOTE — Care Management Note (Addendum)
    Page 1 of 2   03/30/2015     2:31:40 PM CARE MANAGEMENT NOTE 03/30/2015  Patient:  Catherine Gross, Catherine Gross   Account Number:  [id number]  Date Initiated:  03/22/2015  Documentation initiated by:  Lanier Clam  Subjective/Objective Assessment:   44 y/o f Acute CHF.     Action/Plan:   From home.   Anticipated DC Date:  03/30/2015   Anticipated DC Plan:  HOME/SELF CARE      DC Planning Services  CM consult  [facility]      Choice offered to / List presented to:             Status of service:  Completed, signed off Medicare Important Message given?   (If response is "NO", the following Medicare IM given date fields will be blank) Date Medicare IM given:   Medicare IM given by:   Date Additional Medicare IM given:   Additional Medicare IM given by:    Discharge Disposition:  HOME/SELF CARE  Per UR Regulation:  Reviewed for med. necessity/level of care/duration of stay  If discussed at Long Length of Stay Meetings, dates discussed:   03/27/2015  03/29/2015    Comments:  03/30/15 Lanier Clam RN BSN NCM [phone] Spoke to patient about Catherine Gross can get them reasonably priced that she can afford from either [facility] or [facility].Patient voiced understanding.Provided her w/[employer] discount coupon.  03/29/15 Lanier Clam RN BSN NCM [phone] cardio-chf-meds adjusted.[facility] appt already set for 04/02/15-Monday @ 9a.Patient states able to afford her meds.  03/25/15 Lanier Clam RN BSN NCM [phone] Provided w/PCP indigent listings, encouraged [facility]. Provided w/Health insurance info, & other community resources, [facility] med list.  03/23/15 Lanier Clam RN BSN NCM [phone] No anticipated d/c needs.  03/22/15 Lanier Clam RN BSN NCM [phone] No anticipated d/c needs.

## 2015-03-22 NOTE — Progress Notes (Signed)
  Echocardiogram 2D Echocardiogram has been performed.  Catherine Gross 03/22/2015, 1:54 PM

## 2015-03-22 NOTE — ED Provider Notes (Signed)
CSN: [id number]     Arrival date & time 03/21/15  2246 History   First MD Initiated Contact with Patient 03/22/15 0013     Chief Complaint  Patient presents with  . Cough     (Consider location/radiation/quality/duration/timing/severity/associated sxs/prior Treatment) Patient is a 44 y.o. female presenting with cough. The history is provided by the patient.  Cough Cough characteristics:  Non-productive Severity:  Moderate Onset quality:  Gradual Duration:  1 week Timing:  Intermittent Progression:  Unchanged Chronicity:  New Smoker: yes   Context: not animal exposure   Relieved by:  Nothing Worsened by:  Nothing tried Ineffective treatments:  None tried Associated symptoms: chest pain   Associated symptoms: no fever and no wheezing   Chest pain:    Quality:  Sharp   Severity:  Moderate   Onset quality:  Gradual   Duration:  1 week   Progression:  Unchanged   Chronicity:  New Risk factors: no recent travel     History reviewed. No pertinent past medical history. History reviewed. No pertinent past surgical history. History reviewed. No pertinent family history. History  Substance Use Topics  . Smoking status: Current Every Day Smoker  . Smokeless tobacco: Not on file  . Alcohol Use: Yes   OB History    No data available     Review of Systems  Constitutional: Negative for fever.  Respiratory: Positive for cough. Negative for wheezing.   Cardiovascular: Positive for chest pain.  All other systems reviewed and are negative.     Allergies  Review of patient's allergies indicates no known allergies.  Home Medications   Prior to Admission medications   Medication Sig Start Date End Date Taking? Authorizing Provider  HYDROcodone-acetaminophen (NORCO/VICODIN) 5-325 MG per tablet Take 1 tablet by mouth every 6 (six) hours as needed for moderate pain or severe pain. 09/02/14   Shon Baton, MD  ibuprofen (ADVIL,MOTRIN) 600 MG tablet Take 1 tablet (600 mg  total) by mouth every 6 (six) hours as needed for moderate pain. 09/02/14   Shon Baton, MD   BP 174/112 mmHg  Pulse 122  Temp(Src) 98.6 F (37 C) (Oral)  Resp 26  Ht 5\' 5"  (1.651 m)  Wt 175 lb (79.379 kg)  BMI 29.12 kg/m2  SpO2 95%  LMP 02/27/2015 Physical Exam  Constitutional: She is oriented to person, place, and time. She appears well-developed and well-nourished.  HENT:  Head: Normocephalic and atraumatic.  Mouth/Throat: Oropharynx is clear and moist.  Eyes: Conjunctivae are normal. Pupils are equal, round, and reactive to light.  Neck: Normal range of motion. Neck supple.  Cardiovascular: Regular rhythm and intact distal pulses.  Tachycardia present.   Pulmonary/Chest: She has decreased breath sounds. She has no wheezes. She has no rales.  Abdominal: Soft. Bowel sounds are normal. There is no tenderness. There is no rebound and no guarding.  Musculoskeletal: Normal range of motion. She exhibits no edema or tenderness.  Neurological: She is alert and oriented to person, place, and time.  Skin: Skin is warm and dry. She is not diaphoretic.  Psychiatric: She has a normal mood and affect.    ED Course  Procedures (including critical care time) Labs Review Labs Reviewed  CBC WITH DIFFERENTIAL/PLATELET - Abnormal; Notable for the following:    Hemoglobin 10.9 (*)    HCT 34.4 (*)    RDW 17.2 (*)    All other components within normal limits  I-STAT CHEM 8, ED - Abnormal; Notable for the following:  Potassium 3.4 (*)    Glucose, Bld 129 (*)    All other components within normal limits  I-STAT TROPOININ, ED  POC URINE PREG, ED    Imaging Review No results found.   EKG Interpretation   Date/Time:  Wednesday Jasten Guyette 13 2016 23:36:34 EDT Ventricular Rate:  122 PR Interval:  104 QRS Duration: 82 QT Interval:  355 QTC Calculation: 506 R Axis:   60 Text Interpretation:  Sinus tachycardia Confirmed by [facility]  MD,  Samanvitha Germany ([id number]) on 03/22/2015 12:55:46 AM       MDM   Final diagnoses:  None    Medications  furosemide (LASIX) injection 40 mg (not administered)  nitroGLYCERIN (NITROGLYN) 2 % ointment 1 inch (not administered)  sodium chloride 0.9 % bolus 1,000 mL (0 mLs Intravenous Stopped 03/22/15 0249)  albuterol (PROVENTIL) (2.5 MG/3ML) 0.083% nebulizer solution 5 mg (5 mg Nebulization Given 03/22/15 0042)  albuterol (PROVENTIL) (2.5 MG/3ML) 0.083% nebulizer solution 5 mg (5 mg Nebulization Given 03/22/15 0158)  sodium chloride 0.9 % bolus 1,000 mL (0 mLs Intravenous Stopped 03/22/15 0319)  ipratropium (ATROVENT) nebulizer solution 0.5 mg (0.5 mg Nebulization Given 03/22/15 0158)  ketorolac (TORADOL) 30 MG/ML injection 30 mg (30 mg Intravenous Given 03/22/15 0302)   Results for orders placed or performed during the hospital encounter of 03/21/15  CBC with Differential/Platelet  Result Value Ref Range   WBC 9.0 4.0 - 10.5 K/uL   RBC 4.03 3.87 - 5.11 MIL/uL   Hemoglobin 10.9 (L) 12.0 - 15.0 g/dL   HCT 60.4 (L) 54.0 - 98.1 %   MCV 85.4 78.0 - 100.0 fL   MCH 27.0 26.0 - 34.0 pg   MCHC 31.7 30.0 - 36.0 g/dL   RDW 19.1 (H) 47.8 - 29.5 %   Platelets 341 150 - 400 K/uL   Neutrophils Relative % 65 43 - 77 %   Neutro Abs 5.9 1.7 - 7.7 K/uL   Lymphocytes Relative 26 12 - 46 %   Lymphs Abs 2.4 0.7 - 4.0 K/uL   Monocytes Relative 6 3 - 12 %   Monocytes Absolute 0.5 0.1 - 1.0 K/uL   Eosinophils Relative 3 0 - 5 %   Eosinophils Absolute 0.3 0.0 - 0.7 K/uL   Basophils Relative 0 0 - 1 %   Basophils Absolute 0.0 0.0 - 0.1 K/uL  I-Stat Chem 8, ED  Result Value Ref Range   Sodium 143 135 - 145 mmol/L   Potassium 3.4 (L) 3.5 - 5.1 mmol/L   Chloride 106 96 - 112 mmol/L   BUN 8 6 - 23 mg/dL   Creatinine, Ser 6.21 0.50 - 1.10 mg/dL   Glucose, Bld 308 (H) 70 - 99 mg/dL   Calcium, Ion 6.57 8.46 - 1.23 mmol/L   TCO2 21 0 - 100 mmol/L   Hemoglobin 12.6 12.0 - 15.0 g/dL   HCT 96.2 95.2 - 84.1 %  I-stat troponin, ED  Result Value Ref Range   Troponin  i, poc 0.01 0.00 - 0.08 ng/mL   Comment 3          POC Urine Pregnancy, ED (do NOT order at [facility])  Result Value Ref Range   Preg Test, Ur NEGATIVE NEGATIVE   Dg Chest 2 View  03/22/2015   CLINICAL DATA:  Cough.  Unable to sleep.  EXAM: CHEST  2 VIEW  COMPARISON:  06/22/2004  FINDINGS: There is cardiomegaly and vascular pedicle widening. Diffuse interstitial opacities with cephalized blood flow. No pleural effusion or pneumothorax.  IMPRESSION: CHF pattern.   Electronically Signed   By: Marnee Spring M.D.   On: 03/22/2015 03:06    Will admit for first time CHF    Cornellius Kropp, MD 03/22/15 [phone]

## 2015-03-22 NOTE — ED Notes (Signed)
Pt states has a dry cough, when coughing has R sided chest pain that radiates through to back area. Pt ambulated to restroom area w/o difficulty, dry cough noted

## 2015-03-22 NOTE — Progress Notes (Addendum)
Patient ID: Catherine Gross, female   DOB: 01/03/1971, 44 y.o.   MRN: [id number] [facility] PROGRESS NOTE  Fatiha Guzy NWG:[id number] DOB: 1971-05-31 DOA: 03/21/2015 PCP: No PCP Per Patient  Brief narrative:    60 -year-old female with no significant past medical history other than preeclampsia, history of smoking. She presented to [facility] long hospital with reports of worsening cough, congestion started couple of days prior to this admission. Patient reported nonproductive cough and cough exacerbated when laying down. She reports no significant shortness of breath, no chest pain or palpitations. No reports of leg swelling.  On admission, pt was found to have blood pressure of 191/114, HR 112 - 138, RR 18 - 34, oxygen saturation is low as 89% on room air but it has improved with nasal cannula oxygen support to 98%. Blood work was notable for hemoglobin of 10.9, potassium 3.4. Chest x-ray showed CHF pattern. BNP was elevated at 373. Troponin level was within normal limits. Patient was given 20 mg IV Lasix in ED. She was admitted for further evaluation and management of acute CHF exacerbation.  Assessment/Plan:    Principal Problem: Acute diastolic CHF exacerbation / acute respiratory failure with hypoxia - Patient hypoxic on the admission with oxygen saturation of 89% on room air. Likely secondary to acute CHF exacerbation, likely diastolic. - Patient has no previous history of CHF or at least she does not know if she ever had it diagnosed. Chest x-ray on the admission shows CHF pattern. - BNP on the admission is 373. Order placed for 2-D echo for further evaluation. Cardiac enzymes so far within normal limits. - Patient was given Lasix 20 mg IV in ED. Will give 1 dose of Lasix 40 mg IV now. - Appreciate cardiology consult and recommendations. - Continue daily weight and strict intake and output. - Replete electrolytes. Potassium is 3.1 and it was supplemented.  Active Problems: Accelerated  hypertension - Blood pressure initially 191/114. It is 159/100 this morning. We'll stop metoprolol because of acute CHF exacerbation. - She is on hydralazine 50 mg every 6 hours scheduled. She will also get Lasix 40 mg IV. - Appreciate cardiology recommendations.  Hypokalemia - Likely secondary to Lasix. Supplemented - Follow-up BMP tomorrow morning.  Normocytic anemia - Obtain iron, ferritin. - Hemoglobin is 9.2. No reports of bleeding.   DVT Prophylaxis  - Heparin subcutaneous ordered.   Code Status: Full.  Family Communication:  plan of care discussed with the patient Disposition Plan: Patient admitted for CHF exacerbation. She is on IV Lasix. Cardiology consulted.  IV access:  Peripheral IV  Procedures and diagnostic studies:    Dg Chest 2 View 03/22/2015   CHF pattern.   Electronically Signed   By: Marnee Spring M.D.   On: 03/22/2015 03:06   Medical Consultants:  Cardiology  Other Consultants:  None  IAnti-Infectives:   None   Manson Passey, MD  [facility] Pager [phone]  Time spent in minutes: 25 minutes  If 7PM-7AM, please contact night-coverage [email or web address] Password [facility] 03/22/2015, 11:38 AM   LOS: 0 days    HPI/Subjective: No acute overnight events. Reports coughing, nonproductive. No reports of fevers.  Objective: Filed Vitals:   03/22/15 0400 03/22/15 0430 03/22/15 0500 03/22/15 0552  BP: 162/98 157/106 160/106 159/100  Pulse: 122 119 120 112  Temp:    97.9 F (36.6 C)  TempSrc:    Oral  Resp: Height:     (1.651 m)  Weight:  82.963 kg (182 lb 14.4 oz)  SpO2: 92% 89% 91% 98%    Intake/Output Summary (Last 24 hours) at 03/22/15 1138 Last data filed at 03/22/15 0933  Gross per 24 hour  Intake    120 ml  Output    475 ml  Net   -355 ml    Exam:   General:  Pt is alert, follows commands appropriately, not in acute distress  Cardiovascular: Regular rate and rhythm, S1/S2, no murmurs  Respiratory:  Basilar crackles, no wheezing  Abdomen: Soft, non tender, non distended, bowel sounds present  Extremities: No edema, pulses DP and PT palpable bilaterally  Neuro: Grossly nonfocal  Data Reviewed: Basic Metabolic Panel:  Recent Labs Lab 03/22/15 0049 03/22/15 0523  NA 143 138  K 3.4* 3.1*  CL 106 112  CO2  --  21  GLUCOSE 129* 118*  BUN 8 9  CREATININE 0.70 0.73  CALCIUM  --  8.1*   Liver Function Tests:  Recent Labs Lab 03/22/15 0523  AST 14  ALT 11  ALKPHOS 46  BILITOT 0.5  PROT 7.2  ALBUMIN 3.6   No results for input(s): LIPASE, AMYLASE in the last 168 hours. No results for input(s): AMMONIA in the last 168 hours. CBC:  Recent Labs Lab 03/22/15 0040 03/22/15 0049 03/22/15 0523  WBC 9.0  --  7.2  NEUTROABS 5.9  --   --   HGB 10.9* 12.6 9.2*  HCT 34.4* 37.0 28.9*  MCV 85.4  --  85.3  PLT 341  --  272   Cardiac Enzymes:  Recent Labs Lab 03/22/15 0523 03/22/15 1040  TROPONINI 0.03 0.03   BNP: Invalid input(s): POCBNP CBG: No results for input(s): GLUCAP in the last 168 hours.  No results found for this or any previous visit (from the past 240 hour(s)).   Scheduled Meds: . benzonatate  100 mg Oral TID  . furosemide  40 mg Intravenous Daily  . heparin  5,000 Units Subcutaneous 3 times per day  . hydrALAZINE  25 mg Oral 4 times per day  . metoprolol tartrate  25 mg Oral BID  . sodium chloride  3 mL Intravenous Q12H   Continuous Infusions:

## 2015-03-23 DIAGNOSIS — J9601 Acute respiratory failure with hypoxia: Secondary | ICD-10-CM | POA: Insufficient documentation

## 2015-03-23 DIAGNOSIS — I5041 Acute combined systolic (congestive) and diastolic (congestive) heart failure: Secondary | ICD-10-CM

## 2015-03-23 LAB — CBC
HCT: 33.6 % — ABNORMAL LOW (ref 36.0–46.0)
Hemoglobin: 10.6 g/dL — ABNORMAL LOW (ref 12.0–15.0)
MCH: 27.2 pg (ref 26.0–34.0)
MCHC: 31.5 g/dL (ref 30.0–36.0)
MCV: 86.2 fL (ref 78.0–100.0)
PLATELETS: 311 10*3/uL (ref 150–400)
RBC: 3.9 MIL/uL (ref 3.87–5.11)
RDW: 17.7 % — AB (ref 11.5–15.5)
WBC: 6.9 10*3/uL (ref 4.0–10.5)

## 2015-03-23 LAB — BASIC METABOLIC PANEL
Anion gap: 9 (ref 5–15)
BUN: 8 mg/dL (ref 6–23)
CHLORIDE: 105 mmol/L (ref 96–112)
CO2: 22 mmol/L (ref 19–32)
CREATININE: 0.77 mg/dL (ref 0.50–1.10)
Calcium: 9.2 mg/dL (ref 8.4–10.5)
GFR calc Af Amer: >90 mL/min (ref >90)
GFR calc non Af Amer: >90 mL/min (ref >90)
GLUCOSE: 127 mg/dL — AB (ref 70–99)
POTASSIUM: 3.5 mmol/L (ref 3.5–5.1)
Sodium: 136 mmol/L (ref 135–145)

## 2015-03-23 LAB — IRON AND TIBC
Iron: 39 ug/dL — ABNORMAL LOW (ref 42–145)
SATURATION RATIOS: 10 % — AB (ref 20–55)
TIBC: 409 ug/dL (ref 250–470)
UIBC: 370 ug/dL (ref 125–400)

## 2015-03-23 LAB — FERRITIN: FERRITIN: 44 ng/mL (ref 10–291)

## 2015-03-23 MED ORDER — CARVEDILOL 6.25 MG PO TABS
6.2500 mg | ORAL_TABLET | Freq: Two times a day (BID) | ORAL | Status: DC
  Administered 2015-03-23 – 2015-03-24 (×2): 6.25 mg via ORAL
  Filled 2015-03-23 (×2): qty 1

## 2015-03-23 MED ORDER — IBUPROFEN 800 MG PO TABS
800.0000 mg | ORAL_TABLET | Freq: Once | ORAL | Status: AC
  Administered 2015-03-23: 800 mg via ORAL
  Filled 2015-03-23: qty 1

## 2015-03-23 MED ORDER — HYDROCODONE-ACETAMINOPHEN 7.5-325 MG PO TABS
1.0000 | ORAL_TABLET | Freq: Once | ORAL | Status: AC
  Administered 2015-03-23: 1 via ORAL
  Filled 2015-03-23: qty 1

## 2015-03-23 NOTE — Progress Notes (Addendum)
Patient ID: Catherine Gross, female   DOB: 1971/11/26, 44 y.o.   MRN: [id number] [facility] PROGRESS NOTE  Catherine Gross LTJ:[id number] DOB: March 03, 1971 DOA: 03/21/2015 PCP: No PCP Per Patient  Brief narrative:    89 -year-old female with no significant past medical history other than preeclampsia, history of smoking. She presented to [facility] hospital with reports of worsening cough, congestion started couple of days prior to this admission. Patient reported nonproductive cough and cough exacerbated when laying down. Patient did not have shortness of breath or leg swelling.  On admission, a blood work was notable for hemoglobin of 10.9, potassium 3.4. Chest x-ray showed CHF pattern. BNP was elevated at 373. Troponin level was within normal limits. Patient was given 20 mg IV Lasix in ED. She was admitted for further evaluation and management of acute CHF exacerbation. Cardiology has seen the patient in consultation.  Assessment/Plan:    Principal Problem: Acute systolic and diastolic CHF exacerbation / acute respiratory failure with hypoxia - Hypoxia on the admission likely secondary to acute CHF exacerbation. BNP was mildly elevated at 373 on this admission. - 2-D echo on this admission revealed ejection fraction of 35%. - She was given Lasix 20 mg IV in ED. She was then given Lasix 40 mg IV 03/22/2015. - This morning, -3 L fluid balance. No reports of leg swelling. - Cardiac enzymes so far within normal limits. - Cardiology has seen the patient in consultation. They started low-dose Coreg 6.25 mg twice daily. She is also on hydralazine 50 mg every 6 hours. - Depending on aldosterone and renin level we may start low-dose ACE inhibitor. - Continue daily weight and strict intake and output. - Replete electrolytes as needed.  Active Problems: Accelerated hypertension - Blood pressure initially 191/114. It has improved with Coreg and hydralazine. - Will give ace inhibitor once we know  aldosterone and renin level.  Hypokalemia - Secondary to Lasix. Supplemented  Normocytic anemia / iron deficiency anemia - Ferritin within normal limits. Iron is low at 39. Patient likely has component of iron deficiency anemia. - Hemoglobin 10.6. No bleeding.   DVT Prophylaxis  - Heparin subcutaneous ordered while pt is in hospital    Code Status: Full.  Family Communication:  plan of care discussed with the patient Disposition Plan: Aldosterone and renin level are pending. We need to start patient on low-dose ACE inhibitor for blood pressure control. She is not stable for discharge at this time.  IV access:  Peripheral IV  Procedures and diagnostic studies:    Dg Chest 2 View 03/22/2015   CHF pattern.     Medical Consultants:  Cardiology  Other Consultants:  None  IAnti-Infectives:   None   Manson Passey, MD  [facility] Pager [phone]  Time spent in minutes: 25 minutes  If 7PM-7AM, please contact night-coverage [email or web address] Password [facility] 03/23/2015, 12:24 PM   LOS: 1 day    HPI/Subjective: No acute overnight events. Reports cough improving. No fevers overnight. No leg swelling.  Objective: Filed Vitals:   03/23/15 0737 03/23/15 1026 03/23/15 1044 03/23/15 1115  BP:  155/100 145/90 151/89  Pulse: 124     Temp:      TempSrc:      Resp:      Height:      Weight:      SpO2:        Intake/Output Summary (Last 24 hours) at 03/23/15 1224 Last data filed at 03/23/15 0604  Gross per 24 hour  Intake  0 ml  Output   2700 ml  Net  -2700 ml    Exam:   General:  Pt is alert, no distress  Cardiovascular: Tachycardic, appreciate S1, S2  Respiratory: Basilar crackles, no wheezing  Abdomen: Nontender, nondistended abdomen,  appreciate bowel sounds  Extremities: No lower extremity swelling, pulses palpable  Neuro: No focal deficits  Data Reviewed: Basic Metabolic Panel:  Recent Labs Lab 03/22/15 0049 03/22/15 0523 03/23/15 0837   NA 143 138 136  K 3.4* 3.1* 3.5  CL 106 112 105  CO2  --  21 22  GLUCOSE 129* 118* 127*  BUN CREATININE 0.70 0.73 0.77  CALCIUM  --  8.1* 9.2   Liver Function Tests:  Recent Labs Lab 03/22/15 0523  AST 14  ALT 11  ALKPHOS 46  BILITOT 0.5  PROT 7.2  ALBUMIN 3.6   No results for input(s): LIPASE, AMYLASE in the last 168 hours. No results for input(s): AMMONIA in the last 168 hours. CBC:  Recent Labs Lab 03/22/15 0040 03/22/15 0049 03/22/15 0523 03/23/15 0837  WBC 9.0  --  7.2 6.9  NEUTROABS 5.9  --   --   --   HGB 10.9* 12.6 9.2* 10.6*  HCT 34.4* 37.0 28.9* 33.6*  MCV 85.4  --  85.3 86.2  PLT 341  --  272 311   Cardiac Enzymes:  Recent Labs Lab 03/22/15 0523 03/22/15 1040 03/22/15 1735  TROPONINI 0.03 0.03 <0.03   BNP: Invalid input(s): POCBNP CBG: No results for input(s): GLUCAP in the last 168 hours.  No results found for this or any previous visit (from the past 240 hour(s)).   Scheduled Meds: . benzonatate  100 mg Oral TID  . carvedilol  6.25 mg Oral BID WC  . heparin  5,000 Units Subcutaneous 3 times per day  . hydrALAZINE  50 mg Oral 4 times per day  . sodium chloride  3 mL Intravenous Q12H   Continuous Infusions:

## 2015-03-23 NOTE — Progress Notes (Addendum)
SUBJECTIVE:  No complaints except for a mild HA  OBJECTIVE:   Vitals:   Filed Vitals:   03/22/15 1629 03/22/15 2108 03/23/15 0604 03/23/15 0737  BP:  168/106 165/111   Pulse: 124 126 123 124  Temp:  97.8 F (36.6 C) 98.2 F (36.8 C)   TempSrc:  Oral Oral   Resp:  20 20   Height:      Weight:      SpO2:  98% 95%    I&O's:   Intake/Output Summary (Last 24 hours) at 03/23/15 7829 Last data filed at 03/23/15 0604  Gross per 24 hour  Intake    120 ml  Output   3175 ml  Net  -3055 ml   TELEMETRY: Reviewed telemetry pt in NSR:     PHYSICAL EXAM General: Well developed, well nourished, in no acute distress Head: Eyes PERRLA, No xanthomas.   Normal cephalic and atramatic  Lungs:   Clear bilaterally to auscultation and percussion. Heart:   HRRR tachycardic S1 S2 Pulses are 2+ & equal. Abdomen: Bowel sounds are positive, abdomen soft and non-tender without masses  Extremities:   No clubbing, cyanosis or edema.  DP +1 Neuro: Alert and oriented X 3. Psych:  Good affect, responds appropriately   LABS: Basic Metabolic Panel:  Recent Labs  56/21/30 0049 03/22/15 0523  NA 143 138  K 3.4* 3.1*  CL 106 112  CO2  --  21  GLUCOSE 129* 118*  BUN 8 9  CREATININE 0.70 0.73  CALCIUM  --  8.1*   Liver Function Tests:  Recent Labs  03/22/15 0523  AST 14  ALT 11  ALKPHOS 46  BILITOT 0.5  PROT 7.2  ALBUMIN 3.6   No results for input(s): LIPASE, AMYLASE in the last 72 hours. CBC:  Recent Labs  03/22/15 0040 03/22/15 0049 03/22/15 0523  WBC 9.0  --  7.2  NEUTROABS 5.9  --   --   HGB 10.9* 12.6 9.2*  HCT 34.4* 37.0 28.9*  MCV 85.4  --  85.3  PLT 341  --  272   Cardiac Enzymes:  Recent Labs  03/22/15 0523 03/22/15 1040 03/22/15 1735  TROPONINI 0.03 0.03 <0.03   BNP: Invalid input(s): POCBNP D-Dimer: No results for input(s): DDIMER in the last 72 hours. Hemoglobin A1C: No results for input(s): HGBA1C in the last 72 hours. Fasting Lipid Panel: No  results for input(s): CHOL, HDL, LDLCALC, TRIG, CHOLHDL, LDLDIRECT in the last 72 hours. Thyroid Function Tests:  Recent Labs  03/22/15 0523  TSH 1.201   Anemia Panel:  Recent Labs  03/22/15 1728  FERRITIN 44  TIBC 409  IRON 39*   Coag Panel:   Lab Results  Component Value Date   INR 1.03 03/22/2015    RADIOLOGY: Dg Chest 2 View  03/22/2015   CLINICAL DATA:  Cough.  Unable to sleep.  EXAM: CHEST  2 VIEW  COMPARISON:  06/22/2004  FINDINGS: There is cardiomegaly and vascular pedicle widening. Diffuse interstitial opacities with cephalized blood flow. No pleural effusion or pneumothorax.  IMPRESSION: CHF pattern.   Electronically Signed   By: Marnee Spring M.D.   On: 03/22/2015 03:06   ASSESSMENT AND PLAN 1. Recent onset of symptoms of CHF progressive over the past 10 days or so. I suspect her symptoms are secondary to combined systolic and diastolic dysfunction secondary to possible long-standing unrecognized hypertension as well as dietary salt indiscretion. Echocardiogram showed moderate LV dysfunction with severe MR and mild LVH.  I suspect that the MR is secondary to LV dilatation and not primary since there was no mention of intrinsic mitral valve disease such as MVP.    There is moderate pulmonary HTN most likely secondary to pulmonary venous congestion from LV dysfunction and MR.   Her sinus tachycardia may also reflect the recent ingestion of Sudafed. She has also been taking ibuprofen could be contributing to her pulmonary congestion and fluid retention 2. Anemia, etiology unknown. Workup in progress per primary team 3. Hypokalemia. Agree with possibility of primary aldosteronism since she has not been on any drugs known to cause hypokalemia.  Recommendation: She has been given an initial dose of Lasix and lungs are clear this am. Her potassium is being replenished. Repeat BMET this am.  Consider addition of ACE inhibitor after plasma renin and aldosterone activity  is drawn. Low dose carvedilol was started for hypertension and tachycardia and I will increase the dose of this.  I will also get a 24 hour Urine for rule out Pheo and Cushings.  TSH is normal.      Quintella Reichert, MD  03/23/2015  8:07 AM

## 2015-03-24 DIAGNOSIS — I429 Cardiomyopathy, unspecified: Secondary | ICD-10-CM

## 2015-03-24 MED ORDER — KETOROLAC TROMETHAMINE 30 MG/ML IJ SOLN
30.0000 mg | Freq: Four times a day (QID) | INTRAMUSCULAR | Status: AC | PRN
  Administered 2015-03-24 – 2015-03-25 (×3): 30 mg via INTRAVENOUS
  Filled 2015-03-24 (×3): qty 1

## 2015-03-24 MED ORDER — CARVEDILOL 12.5 MG PO TABS
12.5000 mg | ORAL_TABLET | Freq: Two times a day (BID) | ORAL | Status: DC
  Administered 2015-03-24 – 2015-03-26 (×5): 12.5 mg via ORAL
  Filled 2015-03-24 (×5): qty 1

## 2015-03-24 NOTE — Progress Notes (Signed)
Consulting cardiologist: Dr. Cassell Clement   Seen for followup: Cardiomyopathy and hypertension  Subjective:    Breathing more easily, no chest pain or palpitations.  Objective:   Temp:  [98 F (36.7 C)-98.2 F (36.8 C)] 98.2 F (36.8 C) (04/16 0446) Pulse Rate:  [108-114] 114 (04/16 0813) Resp:  [18-20] 18 (04/16 0446) BP: (131-155)/(68-100) 150/90 mmHg (04/16 0446) SpO2:  [97 %-100 %] 99 % (04/16 0446) Weight:  [178 lb 8 oz (80.967 kg)] 178 lb 8 oz (80.967 kg) (04/16 0446) Last BM Date: 03/21/15  Filed Weights   03/21/15 2311 03/22/15 0552 03/24/15 0446  Weight: 175 lb (79.379 kg) 182 lb 14.4 oz (82.963 kg) 178 lb 8 oz (80.967 kg)    Intake/Output Summary (Last 24 hours) at 03/24/15 0858 Last data filed at 03/24/15 0715  Gross per 24 hour  Intake      0 ml  Output   1750 ml  Net  -1750 ml    Exam:  General: Appears comfortable.  Lungs: Clear, nonlabored.  Cardiac: RRR, no S3.  Extremities: No pitting edema.  Lab Results:  Basic Metabolic Panel:  Recent Labs Lab 03/22/15 0049 03/22/15 0523 03/23/15 0837  NA 143 138 136  K 3.4* 3.1* 3.5  CL 106 112 105  CO2  --  21 22  GLUCOSE 129* 118* 127*  BUN 8 9 8   CREATININE 0.70 0.73 0.77  CALCIUM  --  8.1* 9.2    Liver Function Tests:  Recent Labs Lab 03/22/15 0523  AST 14  ALT 11  ALKPHOS 46  BILITOT 0.5  PROT 7.2  ALBUMIN 3.6    CBC:  Recent Labs Lab 03/22/15 0040 03/22/15 0049 03/22/15 0523 03/23/15 0837  WBC 9.0  --  7.2 6.9  HGB 10.9* 12.6 9.2* 10.6*  HCT 34.4* 37.0 28.9* 33.6*  MCV 85.4  --  85.3 86.2  PLT 341  --  272 311    Cardiac Enzymes:  Recent Labs Lab 03/22/15 0523 03/22/15 1040 03/22/15 1735  TROPONINI 0.03 0.03 <0.03    Echocardiogram 03/21/2025: Study Conclusions  - Left ventricle: The cavity size was mildly dilated. Wall thickness was increased in a pattern of mild LVH. Systolic function was moderately reduced. The estimated ejection  fraction was in the range of 35%. - Aortic valve: There was mild regurgitation. - Mitral valve: There was severe regurgitation. - Left atrium: The atrium was mildly to moderately dilated. - Pulmonary arteries: PA peak pressure: 49 mm Hg (S).   Medications:   Scheduled Medications: . benzonatate  100 mg Oral TID  . carvedilol  6.25 mg Oral BID WC  . heparin  5,000 Units Subcutaneous 3 times per day  . hydrALAZINE  50 mg Oral 4 times per day  . sodium chloride  3 mL Intravenous Q12H      PRN Medications:  acetaminophen **OR** acetaminophen, hydrALAZINE, ipratropium-albuterol, ketorolac, menthol-cetylpyridinium, ondansetron **OR** ondansetron (ZOFRAN) IV, zolpidem   Assessment:   1. Presentation with probable combined heart failure and newly documented cardiomyopathy with LVEF 35%. Symptomatically improving. Currently on Coreg and Hydralazine.  2. Severe mitral regurgitation, possibly functional in setting of LV dilatation.  3. Hypertension, secondary causes being investigated by primary team (renin and aldosterone pending, 24 hour urine pending). Not on ACE-I yet pending studies. Was hypokalemic at baseline.   Plan/Discussion:    Increase Coreg to 12.5 mg BID and continue Hydralazine. Followup studies pending. At this point suspect nonischemic cardiomyopathy with functional mitral regurgitation. Continue to optimize  medications since clinically improving and can consider further ischemic/hemodynamic workup later.   [name], M.D., F.A.C.C.

## 2015-03-24 NOTE — Progress Notes (Addendum)
Patient ID: Catherine Gross, female   DOB: [birth year], 44 y.o.   MRN: [id number] [facility] PROGRESS NOTE  Lynzi Meulemans BJY:[id number] DOB: 01-27-1971 DOA: 03/21/2015 PCP: No PCP Per Patient  Brief narrative:    59 -year-old female with no significant past medical history other than preeclampsia, history of smoking. She presented to [facility] long hospital with reports of worsening cough, congestion started couple of days prior to this admission. Patient reported nonproductive cough and cough exacerbated when laying down. Patient did not have shortness of breath or leg swelling.  On admission, blood pressure of 191/114, HR 112 - 138, RR 18 - 34, oxygen saturation is low as 89% on room air but it has improved with nasal cannula oxygen support to 98%. Blood work was notable for hemoglobin of 10.9, potassium 3.4. Chest x-ray showed CHF pattern. BNP was elevated at 373. Troponin level was within normal limits. Patient was given 20 mg IV Lasix in ED. She was admitted for further evaluation and management of acute CHF exacerbation. Cardiology has seen the patient in consultation.  Assessment/Plan:    Principal Problem: Acute systolic and diastolic CHF exacerbation / acute respiratory failure with hypoxia - BNP was mildly elevated at 373 on this admission. - 2-D echo on this admission showed ejection fraction of 35%. - Normal cardiac enzymes. - Diuresis with lasix 4/13 and 4/14. No leg swelling.  - Per cardiology, coreg increased to 12.5 mg BID. - Awaiting aldosterone and renin level prior to initiating  ACEi - Daily weight and strict intake and output.  Active Problems: Accelerated hypertension / Essential hypertension  - Blood pressure initially 191/114. It has improved with Coreg and hydralazine. - Since BP still in 140's range and HR 105 cardio increased to 12.5 mg BID  Hypokalemia - Secondary to Lasix.  - Repleted   Normocytic anemia / iron deficiency anemia - Ferritin within normal  limits. Iron is low at 39. Patient likely has iron deficiency anemia. - Hemoglobin 10.6. Stable.   DVT Prophylaxis  - Heparin subcutaneous ordered while pt is in hospital     Code Status: Full.  Family Communication:  plan of care discussed with the patient Disposition Plan: Aldosterone and renin level are still pending. Not stable for discharge yet.   IV access:  Peripheral IV  Procedures and diagnostic studies:    Dg Chest 2 View 03/22/2015   CHF pattern.     Medical Consultants:  Cardiology  Other Consultants:  None  IAnti-Infectives:   None   Manson Passey, MD  [facility] Pager [phone]  Time spent in minutes: 15 minutes  If 7PM-7AM, please contact night-coverage [email or web address] Password [email or web address] 03/24/2015, 7:03 PM   LOS: 2 days    HPI/Subjective: No acute overnight events. Reports headache this am.  Objective: Filed Vitals:   03/24/15 1213 03/24/15 1417 03/24/15 1648 03/24/15 1820  BP: 136/82 144/81  118/70  Pulse:  112 105   Temp:  97.9 F (36.6 C)    TempSrc:  Oral    Resp:  16    Height:      Weight:      SpO2:  100%      Intake/Output Summary (Last 24 hours) at 03/24/15 1903 Last data filed at 03/24/15 1840  Gross per 24 hour  Intake    960 ml  Output   1350 ml  Net   -390 ml    Exam:   General:  Pt is awake, no distress  Cardiovascular: tachycardic, regular rhythm, appreciate S1,  S2  Respiratory: bilateral air entry, no wheezing   Abdomen: (+) BS, non tender abdomen   Extremities: No leg swelling, palpable pulses   Neuro: Non focal  Data Reviewed: Basic Metabolic Panel:  Recent Labs Lab 03/22/15 0049 03/22/15 0523 03/23/15 0837  NA 143 138 136  K 3.4* 3.1* 3.5  CL 106 112 105  CO2  --  21 22  GLUCOSE 129* 118* 127*  BUN 8 9 8   CREATININE 0.70 0.73 0.77  CALCIUM  --  8.1* 9.2   Liver Function Tests:  Recent Labs Lab 03/22/15 0523  AST 14  ALT 11  ALKPHOS 46  BILITOT 0.5  PROT 7.2  ALBUMIN 3.6   No  results for input(s): LIPASE, AMYLASE in the last 168 hours. No results for input(s): AMMONIA in the last 168 hours. CBC:  Recent Labs Lab 03/22/15 0040 03/22/15 0049 03/22/15 0523 03/23/15 0837  WBC 9.0  --  7.2 6.9  NEUTROABS 5.9  --   --   --   HGB 10.9* 12.6 9.2* 10.6*  HCT 34.4* 37.0 28.9* 33.6*  MCV 85.4  --  85.3 86.2  PLT 341  --  272 311   Cardiac Enzymes:  Recent Labs Lab 03/22/15 0523 03/22/15 1040 03/22/15 1735  TROPONINI 0.03 0.03 <0.03   BNP: Invalid input(s): POCBNP CBG: No results for input(s): GLUCAP in the last 168 hours.  No results found for this or any previous visit (from the past 240 hour(s)).   Scheduled Meds: . benzonatate  100 mg Oral TID  . carvedilol  12.5 mg Oral BID WC  . heparin  5,000 Units Subcutaneous 3 times per day  . hydrALAZINE  50 mg Oral 4 times per day  . sodium chloride  3 mL Intravenous Q12H   Continuous Infusions:

## 2015-03-25 MED ORDER — ISOSORBIDE MONONITRATE 15 MG HALF TABLET
15.0000 mg | ORAL_TABLET | Freq: Every day | ORAL | Status: DC
  Administered 2015-03-25 – 2015-03-30 (×6): 15 mg via ORAL
  Filled 2015-03-25 (×6): qty 1

## 2015-03-25 MED ORDER — OXYCODONE-ACETAMINOPHEN 5-325 MG PO TABS
1.0000 | ORAL_TABLET | ORAL | Status: DC | PRN
  Administered 2015-03-25 – 2015-03-30 (×15): 1 via ORAL
  Filled 2015-03-25 (×16): qty 1

## 2015-03-25 NOTE — Progress Notes (Signed)
Patient ID: Catherine Gross, female   DOB: [birth year], 44 y.o.   MRN: [id number] [facility] PROGRESS NOTE  Catherine Gross WPV:[id number] DOB: Oct 01, 1971 DOA: 03/21/2015 PCP: No PCP Per Patient  Brief narrative:    44 -year-old female with no significant past medical history other than preeclampsia, history of smoking. Pt presented to [facility] hospital with reports of worsening non productive cough, congestion started couple of days prior to this admission. No fevers or chills. No chest pain.   On admission, pt had blood pressure of 191/114, HR 112 - 138, RR 18 - 34, oxygen saturation 89% on room air but has improved with nasal cannula oxygen support. Blood work was notable for hemoglobin of 10.9, potassium 3.4. Chest x-ray showed CHF pattern. BNP was mildly elevated at 373. Troponin level was within normal limits. Patient was given 20 mg IV Lasix in ED. She was admitted for further evaluation and management of acute CHF exacerbation. Cardiology has seen the patient in consultation. So far we awaiting renin and aldosterone levels. We are adjusting BP meds to get to target BP and HR> Cardio assistance appreciated.    Assessment/Plan:    Principal Problem: Acute systolic and diastolic CHF exacerbation / acute respiratory failure with hypoxia - 2-D echo on this admission showed ejection fraction of 35%. BNP 373 on this admission. - Troponi WNL. - Had lasix 4/13 and 4/14. - Coreg now 12.5 mg PO BID, hydralazine 50 mg Q 6 hours, added imdur today 15 mg daily.  - Awaiting aldosterone and renin level prior to initiating  ACEi - Daily weight and strict intake and output. - Weight since admission, 82.96 kg --> 80.83 kg   Active Problems: Accelerated hypertension / Essential hypertension  - Blood pressure initially 191/114. It has improved with Coreg and hydralazine but still in 140's.  - Added imdur 15 mg daily.  Hypokalemia - Secondary to Lasix. Supplemented.   Normocytic anemia / iron  deficiency anemia - Ferritin within normal limits. Iron is low at 39 consistent with IDA. - Hgb stable. No indications for transfusion.    DVT Prophylaxis  - Heparin subcutaneous    Code Status: Full.  Family Communication:  plan of care discussed with the patient Disposition Plan: Aldosterone and renin level pending. Not stable for discharge yet.   IV access:  Peripheral IV  Procedures and diagnostic studies:    Dg Chest 2 View 03/22/2015   CHF pattern.     Medical Consultants:  Cardiology  Other Consultants:  None  IAnti-Infectives:   None   Manson Passey, MD  [facility] Pager [phone]  Time spent in minutes: 15 minutes  If 7PM-7AM, please contact night-coverage [email or web address] Password [facility] 03/25/2015, 10:54 AM   LOS: 3 days    HPI/Subjective: No acute overnight events. Headache 7/10 this am. No dizziness. No blurred vision. No numbness.  Objective: Filed Vitals:   03/24/15 1820 03/24/15 2141 03/24/15 2313 03/25/15 0500  BP: 118/70 129/79 139/88 141/86  Pulse:  100 99 104  Temp:  98.3 F (36.8 C)  98.2 F (36.8 C)  TempSrc:  Oral  Oral  Resp:  18  16  Height:      Weight:    80.831 kg (178 lb 3.2 oz)  SpO2:  100%  100%    Intake/Output Summary (Last 24 hours) at 03/25/15 1054 Last data filed at 03/25/15 0500  Gross per 24 hour  Intake    960 ml  Output   1150 ml  Net   -  190 ml    Exam:   General:  Pt is not in acute distress   Cardiovascular: (+) S1, S2, slight tachy, regular rhythm  Respiratory: no wheezing, no crackles   Abdomen: non tender, non distended abd, (+) BS  Extremities: No edema, pulses palpable   Neuro: No focal deficits   Data Reviewed: Basic Metabolic Panel:  Recent Labs Lab 03/22/15 0049 03/22/15 0523 03/23/15 0837  NA 143 138 136  K 3.4* 3.1* 3.5  CL 106 112 105  CO2  --  21 22  GLUCOSE 129* 118* 127*  BUN CREATININE 0.70 0.73 0.77  CALCIUM  --  8.1* 9.2   Liver Function  Tests:  Recent Labs Lab 03/22/15 0523  AST 14  ALT 11  ALKPHOS 46  BILITOT 0.5  PROT 7.2  ALBUMIN 3.6   No results for input(s): LIPASE, AMYLASE in the last 168 hours. No results for input(s): AMMONIA in the last 168 hours. CBC:  Recent Labs Lab 03/22/15 0040 03/22/15 0049 03/22/15 0523 03/23/15 0837  WBC 9.0  --  7.2 6.9  NEUTROABS 5.9  --   --   --   HGB 10.9* 12.6 9.2* 10.6*  HCT 34.4* 37.0 28.9* 33.6*  MCV 85.4  --  85.3 86.2  PLT 341  --  272 311   Cardiac Enzymes:  Recent Labs Lab 03/22/15 0523 03/22/15 1040 03/22/15 1735  TROPONINI 0.03 0.03 <0.03   BNP: Invalid input(s): POCBNP CBG: No results for input(s): GLUCAP in the last 168 hours.  No results found for this or any previous visit (from the past 240 hour(s)).   Scheduled Meds: . benzonatate  100 mg Oral TID  . carvedilol  12.5 mg Oral BID WC  . heparin  5,000 Units Subcutaneous 3 times per day  . hydrALAZINE  50 mg Oral 4 times per day  . isosorbide mononitrate  15 mg Oral Daily

## 2015-03-25 NOTE — Progress Notes (Signed)
Consulting cardiologist: Dr. Cassell Clement   Seen for followup: Cardiomyopathy and hypertension  Subjective:    No chest pain or palpitations. Headache this morning.  Objective:   Temp:  [97.9 F (36.6 C)-98.3 F (36.8 C)] 98.2 F (36.8 C) (04/17 0500) Pulse Rate:  [99-112] 104 (04/17 0500) Resp:  [16-18] 16 (04/17 0500) BP: (118-144)/(70-88) 141/86 mmHg (04/17 0500) SpO2:  [100 %] 100 % (04/17 0500) Weight:  [178 lb 3.2 oz (80.831 kg)] 178 lb 3.2 oz (80.831 kg) (04/17 0500) Last BM Date: 03/24/15  Filed Weights   03/22/15 0552 03/24/15 0446 03/25/15 0500  Weight: 182 lb 14.4 oz (82.963 kg) 178 lb 8 oz (80.967 kg) 178 lb 3.2 oz (80.831 kg)    Intake/Output Summary (Last 24 hours) at 03/25/15 0849 Last data filed at 03/25/15 0500  Gross per 24 hour  Intake    960 ml  Output   1150 ml  Net   -190 ml   Telemetry: Sinus rhythm and sinus tachycardia.  Exam:  General: Appears comfortable.  Lungs: Clear, nonlabored.  Cardiac: RRR, no S3.  Extremities: No pitting edema.  Lab Results:  Basic Metabolic Panel:  Recent Labs Lab 03/22/15 0049 03/22/15 0523 03/23/15 0837  NA 143 138 136  K 3.4* 3.1* 3.5  CL 106 112 105  CO2  --  21 22  GLUCOSE 129* 118* 127*  BUN CREATININE 0.70 0.73 0.77  CALCIUM  --  8.1* 9.2    Liver Function Tests:  Recent Labs Lab 03/22/15 0523  AST 14  ALT 11  ALKPHOS 46  BILITOT 0.5  PROT 7.2  ALBUMIN 3.6    CBC:  Recent Labs Lab 03/22/15 0040 03/22/15 0049 03/22/15 0523 03/23/15 0837  WBC 9.0  --  7.2 6.9  HGB 10.9* 12.6 9.2* 10.6*  HCT 34.4* 37.0 28.9* 33.6*  MCV 85.4  --  85.3 86.2  PLT 341  --  272 311    Cardiac Enzymes:  Recent Labs Lab 03/22/15 0523 03/22/15 1040 03/22/15 1735  TROPONINI 0.03 0.03 <0.03    Echocardiogram 03/21/2025: Study Conclusions  - Left ventricle: The cavity size was mildly dilated. Wall thickness was increased in a pattern of mild LVH. Systolic function  was moderately reduced. The estimated ejection fraction was in the range of 35%. - Aortic valve: There was mild regurgitation. - Mitral valve: There was severe regurgitation. - Left atrium: The atrium was mildly to moderately dilated. - Pulmonary arteries: PA peak pressure: 49 mm Hg (S).   Medications:   Scheduled Medications: . benzonatate  100 mg Oral TID  . carvedilol  12.5 mg Oral BID WC  . heparin  5,000 Units Subcutaneous 3 times per day  . hydrALAZINE  50 mg Oral 4 times per day  . sodium chloride  3 mL Intravenous Q12H     PRN Medications: acetaminophen **OR** acetaminophen, hydrALAZINE, ipratropium-albuterol, ketorolac, menthol-cetylpyridinium, ondansetron **OR** ondansetron (ZOFRAN) IV, oxyCODONE-acetaminophen, zolpidem   Assessment:   1. Presentation with probable combined heart failure and newly documented cardiomyopathy with LVEF 35%. Symptomatically improving. Currently on Coreg and Hydralazine.  2. Severe mitral regurgitation, possibly functional in setting of LV dilatation.  3. Hypertension, secondary causes being investigated by primary team (renin and aldosterone pending, 24 hour urine pending). Not on ACE-I yet pending studies. Was hypokalemic at baseline.   Plan/Discussion:    Continue Coreg 12.5 mg BID and Hydralazine, will add nitrate (might be able to use BiDil eventually). Followup studies pending.  At this point suspect nonischemic cardiomyopathy with functional mitral regurgitation. Continue to optimize medications since clinically improving and can consider further ischemic/hemodynamic workup later. May be a good patient for the Advanced Heart Failure clinic.   [name], M.D., F.A.C.C.

## 2015-03-26 ENCOUNTER — Inpatient Hospital Stay (HOSPITAL_COMMUNITY): Payer: Medicaid Other

## 2015-03-26 DIAGNOSIS — I509 Heart failure, unspecified: Secondary | ICD-10-CM

## 2015-03-26 LAB — CBC WITH DIFFERENTIAL/PLATELET
Basophils Absolute: 0 10*3/uL (ref 0.0–0.1)
Basophils Relative: 0 % (ref 0–1)
EOS ABS: 0.3 10*3/uL (ref 0.0–0.7)
EOS PCT: 5 % (ref 0–5)
HEMATOCRIT: 30.6 % — AB (ref 36.0–46.0)
Hemoglobin: 9.5 g/dL — ABNORMAL LOW (ref 12.0–15.0)
Lymphocytes Relative: 32 % (ref 12–46)
Lymphs Abs: 1.7 10*3/uL (ref 0.7–4.0)
MCH: 26.6 pg (ref 26.0–34.0)
MCHC: 31 g/dL (ref 30.0–36.0)
MCV: 85.7 fL (ref 78.0–100.0)
MONOS PCT: 9 % (ref 3–12)
Monocytes Absolute: 0.5 10*3/uL (ref 0.1–1.0)
NEUTROS PCT: 54 % (ref 43–77)
Neutro Abs: 2.9 10*3/uL (ref 1.7–7.7)
Platelets: 350 10*3/uL (ref 150–400)
RBC: 3.57 MIL/uL — AB (ref 3.87–5.11)
RDW: 17.2 % — ABNORMAL HIGH (ref 11.5–15.5)
WBC: 5.4 10*3/uL (ref 4.0–10.5)

## 2015-03-26 LAB — BASIC METABOLIC PANEL
Anion gap: 5 (ref 5–15)
BUN: 12 mg/dL (ref 6–23)
CO2: 25 mmol/L (ref 19–32)
Calcium: 8.5 mg/dL (ref 8.4–10.5)
Chloride: 109 mmol/L (ref 96–112)
Creatinine, Ser: 0.71 mg/dL (ref 0.50–1.10)
GFR calc Af Amer: >90 mL/min (ref >90)
GLUCOSE: 117 mg/dL — AB (ref 70–99)
POTASSIUM: 3.3 mmol/L — AB (ref 3.5–5.1)
Sodium: 139 mmol/L (ref 135–145)

## 2015-03-26 MED ORDER — FERROUS SULFATE 325 (65 FE) MG PO TABS
325.0000 mg | ORAL_TABLET | Freq: Every day | ORAL | Status: DC
  Administered 2015-03-27 – 2015-03-30 (×4): 325 mg via ORAL
  Filled 2015-03-26 (×4): qty 1

## 2015-03-26 NOTE — Progress Notes (Signed)
Patient Name: Camiah Humm Date of Encounter: 03/26/2015     Principal Problem:   Acute CHF Active Problems:   Accelerated hypertension   Cough   Sinus tachycardia   Hypokalemia   Normocytic anemia   Acute diastolic CHF (congestive heart failure)   Systolic and diastolic CHF, acute   Acute respiratory failure with hypoxia    SUBJECTIVE  Feels better. Headache is less. No chest pain.Pulse is still fast.  CURRENT MEDS . benzonatate  100 mg Oral TID  . carvedilol  12.5 mg Oral BID WC  . heparin  5,000 Units Subcutaneous 3 times per day  . hydrALAZINE  50 mg Oral 4 times per day  . isosorbide mononitrate  15 mg Oral Daily  . sodium chloride  3 mL Intravenous Q12H    OBJECTIVE  Filed Vitals:   03/25/15 1348 03/25/15 2104 03/26/15 0458 03/26/15 0743  BP: 115/75 113/61 158/98 148/83  Pulse: 97 97 110 103  Temp: 97.6 F (36.4 C) 97.8 F (36.6 C) 98 F (36.7 C)   TempSrc: Oral Oral Oral   Resp: Height:      Weight:   179 lb 9.6 oz (81.466 kg)   SpO2: 100% 100% 100%     Intake/Output Summary (Last 24 hours) at 03/26/15 0811 Last data filed at 03/26/15 0459  Gross per 24 hour  Intake    720 ml  Output   1600 ml  Net   -880 ml   Filed Weights   03/24/15 0446 03/25/15 0500 03/26/15 0458  Weight: 178 lb 8 oz (80.967 kg) 178 lb 3.2 oz (80.831 kg) 179 lb 9.6 oz (81.466 kg)    PHYSICAL EXAM  General: Pleasant, NAD. Neuro: Alert and oriented X 3. Moves all extremities spontaneously. Psych: Normal affect. HEENT:  Normal  Neck: Supple without bruits or JVD. Lungs:  Resp regular and unlabored, CTA. Heart: RRR no s3, s4, soft apical systolic murmur. Abdomen: Soft, non-tender, non-distended, BS + x 4.  Extremities: No clubbing, cyanosis or edema. DP/PT/Radials 2+ and equal bilaterally.  Accessory Clinical Findings  CBC  Recent Labs  03/23/15 0837  WBC 6.9  HGB 10.6*  HCT 33.6*  MCV 86.2  PLT 311   Basic Metabolic Panel  Recent Labs  03/23/15 0837  NA 136  K 3.5  CL 105  CO2 22  GLUCOSE 127*  BUN 8  CREATININE 0.77  CALCIUM 9.2   Liver Function Tests No results for input(s): AST, ALT, ALKPHOS, BILITOT, PROT, ALBUMIN in the last 72 hours. No results for input(s): LIPASE, AMYLASE in the last 72 hours. Cardiac Enzymes No results for input(s): CKTOTAL, CKMB, CKMBINDEX, TROPONINI in the last 72 hours. BNP Invalid input(s): POCBNP D-Dimer No results for input(s): DDIMER in the last 72 hours. Hemoglobin A1C No results for input(s): HGBA1C in the last 72 hours. Fasting Lipid Panel No results for input(s): CHOL, HDL, LDLCALC, TRIG, CHOLHDL, LDLDIRECT in the last 72 hours. Thyroid Function Tests No results for input(s): TSH, T4TOTAL, T3FREE, THYROIDAB in the last 72 hours.  Invalid input(s): FREET3  TELE  Sinus tachycardia.  ECG   Radiology/Studies  Dg Chest 2 View  03/22/2015   CLINICAL DATA:  Cough.  Unable to sleep.  EXAM: CHEST  2 VIEW  COMPARISON:  06/22/2004  FINDINGS: There is cardiomegaly and vascular pedicle widening. Diffuse interstitial opacities with cephalized blood flow. No pleural effusion or pneumothorax.  IMPRESSION: CHF pattern.   Electronically Signed   By: Marja Kays  Watts M.D.   On: 03/22/2015 03:06    ASSESSMENT AND PLAN 1. combined heart failure and newly documented cardiomyopathy with LVEF 35%. Symptomatically improving. Currently on Coreg and Hydralazine.  2. Severe mitral regurgitation, possibly functional in setting of LV dilatation.  3. Hypertension, secondary causes being investigated by primary team (renin and aldosterone pending, 24 hour urine pending). Not on ACE-I yet pending studies. Was hypokalemic at baseline.  4.Iron deficiency anemia. History of heavy menses. Consider adding oral iron.  Plan: Will check followup BMET and CBC today. Will check followup chest xray today. Not presently on diuretic. Serum renin/aldosterone levels pending. Signed, Cassell Clement  MD

## 2015-03-26 NOTE — Progress Notes (Addendum)
Patient ID: Catherine Gross, female   DOB: 1971/08/08, 44 y.o.   MRN: [id number] [facility] PROGRESS NOTE  Catherine Gross [id number]:[id number] DOB: Dec 16, 1970 DOA: 03/21/2015 PCP: No PCP Per Patient  Brief narrative:    20 -year-old female with no significant past medical history other than preeclampsia, history of smoking. Pt presented to [facility] long hospital with reports of worsening non productive cough, congestion started couple of days prior to this admission.  On admission, pt had blood pressure of 191/114, HR 112 - 138, RR 18 - 34, oxygen saturation 89% on room air but has improved with nasal cannula oxygen support. Blood work was notable for hemoglobin of 10.9, potassium 3.4. Chest x-ray showed CHF pattern. BNP was mildly elevated at 373. Troponin level was within normal limits. Patient was given 20 mg IV Lasix in ED. She was admitted for further evaluation and management of acute CHF exacerbation. Cardiology has seen the patient in consultation. Optimizing medical management.    Assessment/Plan:    Principal Problem: Acute systolic and diastolic CHF exacerbation / nonischemic cardiomyopathy / acute respiratory failure with hypoxia - Patient admitted with hypoxia. Chest x-ray showed CHF pattern. - 2-D echo on this admission shows ejection fraction of 35%. BNP is 373. - Patient was given Lasix on 03/21/2015 and 03/22/2015. - Cardiology has seen the patient in consultation. Patient is currently on Coreg 12.5 mg twice daily, hydralazine 50 mg every 6 hours and Imdur 15 mg daily - Aldosterone and renin level are pending. Plan to initiate ACE inhibitor once these results are available. - Continue daily weight and strict intake and output. - Weight trend. 80.96 kg --> 81.46 kg in past 72 hours    Active Problems: Severe mitral regurgitation - Possibly functional in setting of LV dilatation.  Accelerated hypertension / Essential hypertension  - Blood pressure initially 191/114. Currently  143/83. - Continue Coreg, hydralazine and Imdur  Hypokalemia - Secondary to Lasix. Potassium was supplemented.   - Check BMP tomorrow morning  Normocytic anemia / iron deficiency anemia - Ferritin within normal limits. Iron is low at 39 consistent with IDA. - Added ferrous sulfate once a day.   DVT Prophylaxis  - Heparin subcutaneous while patient is in hospital    Code Status: Full.  Family Communication:  plan of care discussed with the patient Disposition Plan: Aldosterone and renin level pending. Not stable for discharge yet.   IV access:  Peripheral IV  Procedures and diagnostic studies:    Dg Chest 2 View 03/22/2015   CHF pattern.     Medical Consultants:  Cardiology  Other Consultants:  None  IAnti-Infectives:   None   Manson Passey, MD  [facility] Pager [phone]  Time spent in minutes: 25 minutes  If 7PM-7AM, please contact night-coverage [email or web address] Password [email or web address] 03/26/2015, 11:20 AM   LOS: 4 days    HPI/Subjective: No acute overnight events. Headache 2 out of 10 this morning. Feels better.   Objective: Filed Vitals:   03/25/15 1348 03/25/15 2104 03/26/15 0458 03/26/15 0743  BP: 115/75 113/61 158/98 148/83  Pulse: 97 97 110 103  Temp: 97.6 F (36.4 C) 97.8 F (36.6 C) 98 F (36.7 C)   TempSrc: Oral Oral Oral   Resp: Height:      Weight:   81.466 kg (179 lb 9.6 oz)   SpO2: 100% 100% 100%     Intake/Output Summary (Last 24 hours) at 03/26/15 1120 Last data filed at 03/26/15 0943  Gross per 24  hour  Intake    243 ml  Output   1400 ml  Net  -1157 ml    Exam:   General:  Pt is not in acute distress   Cardiovascular:  Appreciated S1-S2, regular rhythm  Respiratory: Bilateral air entry, no wheezing  Abdomen: Appreciate bowel sounds, no tenderness, no distention   Extremities:  no leg swelling, pulses palpable   Neuro:  no focal neurological deficits   Data Reviewed: Basic Metabolic Panel:  Recent  Labs Lab 03/22/15 0049 03/22/15 0523 03/23/15 0837  NA 143 138 136  K 3.4* 3.1* 3.5  CL 106 112 105  CO2  --  21 22  GLUCOSE 129* 118* 127*  BUN 8 9 8   CREATININE 0.70 0.73 0.77  CALCIUM  --  8.1* 9.2   Liver Function Tests:  Recent Labs Lab 03/22/15 0523  AST 14  ALT 11  ALKPHOS 46  BILITOT 0.5  PROT 7.2  ALBUMIN 3.6   No results for input(s): LIPASE, AMYLASE in the last 168 hours. No results for input(s): AMMONIA in the last 168 hours. CBC:  Recent Labs Lab 03/22/15 0040 03/22/15 0049 03/22/15 0523 03/23/15 0837  WBC 9.0  --  7.2 6.9  NEUTROABS 5.9  --   --   --   HGB 10.9* 12.6 9.2* 10.6*  HCT 34.4* 37.0 28.9* 33.6*  MCV 85.4  --  85.3 86.2  PLT 341  --  272 311   Cardiac Enzymes:  Recent Labs Lab 03/22/15 0523 03/22/15 1040 03/22/15 1735  TROPONINI 0.03 0.03 <0.03   BNP: Invalid input(s): POCBNP CBG: No results for input(s): GLUCAP in the last 168 hours.  No results found for this or any previous visit (from the past 240 hour(s)).   Scheduled Meds: . benzonatate  100 mg Oral TID  . carvedilol  12.5 mg Oral BID WC  . heparin  5,000 Units Subcutaneous 3 times per day  . hydrALAZINE  50 mg Oral 4 times per day  . isosorbide mononitrate  15 mg Oral Daily

## 2015-03-27 MED ORDER — CARVEDILOL 25 MG PO TABS
25.0000 mg | ORAL_TABLET | Freq: Two times a day (BID) | ORAL | Status: DC
Start: 2015-03-27 — End: 2015-03-27

## 2015-03-27 MED ORDER — CARVEDILOL 25 MG PO TABS
25.0000 mg | ORAL_TABLET | Freq: Two times a day (BID) | ORAL | Status: DC
  Administered 2015-03-27 – 2015-03-30 (×7): 25 mg via ORAL
  Filled 2015-03-27 (×7): qty 1

## 2015-03-27 NOTE — Progress Notes (Signed)
Patient ID: Catherine Gross, female   DOB: May 20, 1971, 44 y.o.   MRN: [id number] [facility] PROGRESS NOTE  Kambrea Scheerer KPQ:[id number] DOB: October 18, 1971 DOA: 03/21/2015 PCP: No PCP Per Patient patient does not have a primary care physician. Our case manager gave resources so the patient can establish primary care physician with our [facility]  Brief narrative:    81 -year-old female with no significant past medical history other than preeclampsia, history of smoking. Pt presented to [facility] hospital with reports of worsening non productive cough, congestion started couple of days prior to this admission.  On admission, pt had blood pressure of 191/114, HR 112 - 138, RR 18 - 34, oxygen saturation 89% on room air but has improved with nasal cannula oxygen support. Blood work was notable for hemoglobin of 10.9, potassium 3.4. Chest x-ray showed CHF pattern. BNP was mildly elevated at 373. Troponin level was within normal limits. Patient was given 20 mg IV Lasix in ED. She was admitted for further evaluation and management of acute CHF exacerbation. Cardiology has seen the patient in consultation. Optimizing medical management.   Barrier to discharge: Patient still hypertensive with current blood pressure medications. Cardiology has increased Coreg from 12.5 mg to 25 mg. Also awaiting aldosterone, renin, metanephrine results all of which are pending as of 03/27/2015.   Assessment/Plan:    Principal Problem: Acute systolic and diastolic CHF exacerbation / nonischemic cardiomyopathy / acute respiratory failure with hypoxia - Patient admitted with hypoxia. Chest x-ray showed CHF pattern. She was given Lasix on admission 40 mg IV as well as 03/22/2015. - 2-D echo on this admission shows ejection fraction of 35%. BNP is 373. - Cardiology has seen the patient and recommended Coreg, of note current dose 25 mg twice daily. Patient is also on hydralazine 50 mg every 6 hours and  immature 15 mg daily. - Plan was to start ACE inhibitor but we are awaiting aldosterone, renin and metanephrine level results which are pending as of 03/27/2015. - Weight trend. 80.96 kg --> 81.5 kg in past 72 hours  - Continue strict intake and output monitor. Replete electrolytes as needed. - Of note, per cardiology possible discharge tomorrow morning if blood pressure controlled.  Active Problems: Severe mitral regurgitation - Possibly functional in setting of LV dilatation. - Cardiology is following.  Accelerated hypertension / Essential hypertension  - Blood pressure initially 191/114. - Continue Coreg, hydralazine and Imdur. Cardiology has increased Coreg from 12.5 mg to 25 mg for better blood pressure control and heart rate control.  Hypokalemia - Secondary to Lasix.  - Potassium supplemented.  Normocytic anemia / iron deficiency anemia - Ferritin within normal limits. Iron is low at 39 consistent with IDA. - Continue daily ferrous sulfate supplementation.   DVT Prophylaxis  - Heparin subcutaneous while patient is in hospital    Code Status: Full.  Family Communication:  plan of care discussed with the patient Disposition Plan: Aldosterone and renin level pending. Not stable for discharge yet.   IV access:  Peripheral IV  Procedures and diagnostic studies:    Dg Chest 2 View 03/22/2015   CHF pattern.     Medical Consultants:  Cardiology  Other Consultants:  None  IAnti-Infectives:   None   Manson Passey, MD  [facility] Pager [phone]  Time spent in minutes: 15 minutes  If 7PM-7AM, please contact night-coverage [email or web address] Password [email or web address] 03/27/2015, 11:56 AM   LOS: 5 days    HPI/Subjective: No acute overnight events. Headache  resolved. Mild cough. Nonproductive.   Objective: Filed Vitals:   03/26/15 2134 03/27/15 0500 03/27/15 0622 03/27/15 0929  BP: 123/68  154/85 158/94  Pulse: 102  109 97  Temp: 98.3 F (36.8 C)  98.4 F (36.9  C)   TempSrc: Oral  Oral   Resp: 16  18   Height:      Weight:  81.557 kg (179 lb 12.8 oz)    SpO2: 100%  97%     Intake/Output Summary (Last 24 hours) at 03/27/15 1156 Last data filed at 03/27/15 0523  Gross per 24 hour  Intake    240 ml  Output   1500 ml  Net  -1260 ml    Exam:   General:  Pt is alert, awake, no distress  Cardiovascular:  Slightly tachycardic, regular rhythm, appreciated S1-S2  Respiratory: No wheezing, no rhonchi  Abdomen: Nontender, nondistended, appreciate bowel sounds  Extremities:  No swelling in lower extremities, pulses palpable, no cyanosis  Neuro:  Nonfocal  Data Reviewed: Basic Metabolic Panel:  Recent Labs Lab 03/22/15 0049 03/22/15 0523 03/23/15 0837 03/26/15 1108  NA 143 138 136 139  K 3.4* 3.1* 3.5 3.3*  CL 106 112 105 109  CO2  --  GLUCOSE 129* 118* 127* 117*  BUN CREATININE 0.70 0.73 0.77 0.71  CALCIUM  --  8.1* 9.2 8.5   Liver Function Tests:  Recent Labs Lab 03/22/15 0523  AST 14  ALT 11  ALKPHOS 46  BILITOT 0.5  PROT 7.2  ALBUMIN 3.6   No results for input(s): LIPASE, AMYLASE in the last 168 hours. No results for input(s): AMMONIA in the last 168 hours. CBC:  Recent Labs Lab 03/22/15 0040 03/22/15 0049 03/22/15 0523 03/23/15 0837 03/26/15 1108  WBC 9.0  --  7.2 6.9 5.4  NEUTROABS 5.9  --   --   --  2.9  HGB 10.9* 12.6 9.2* 10.6* 9.5*  HCT 34.4* 37.0 28.9* 33.6* 30.6*  MCV 85.4  --  85.3 86.2 85.7  PLT 341  --  272 311 350   Cardiac Enzymes:  Recent Labs Lab 03/22/15 0523 03/22/15 1040 03/22/15 1735  TROPONINI 0.03 0.03 <0.03   BNP: Invalid input(s): POCBNP CBG: No results for input(s): GLUCAP in the last 168 hours.  No results found for this or any previous visit (from the past 240 hour(s)).   Scheduled Meds: . benzonatate  100 mg Oral TID  . carvedilol  25 mg Oral BID WC  . heparin  5,000 Units Subcutaneous 3 times per day  . hydrALAZINE  50 mg Oral 4 times  per day  . isosorbide mononitrate  15 mg Oral Daily

## 2015-03-27 NOTE — Progress Notes (Signed)
Patient Name: Catherine Gross Date of Encounter: 03/27/2015     Principal Problem:   Acute CHF Active Problems:   Accelerated hypertension   Cough   Sinus tachycardia   Hypokalemia   Normocytic anemia   Acute diastolic CHF (congestive heart failure)   Systolic and diastolic CHF, acute   Acute respiratory failure with hypoxia   CHF (congestive heart failure)    SUBJECTIVE  No chest pain or shortness of breath.  She does complain of headache.  CURRENT MEDS . benzonatate  100 mg Oral TID  . carvedilol  25 mg Oral BID WC  . ferrous sulfate  325 mg Oral Q breakfast  . heparin  5,000 Units Subcutaneous 3 times per day  . hydrALAZINE  50 mg Oral 4 times per day  . isosorbide mononitrate  15 mg Oral Daily  . sodium chloride  3 mL Intravenous Q12H    OBJECTIVE  Filed Vitals:   03/26/15 1731 03/26/15 2134 03/27/15 0500 03/27/15 0622  BP: 134/86 123/68  154/85  Pulse: 101 102  109  Temp:  98.3 F (36.8 C)  98.4 F (36.9 C)  TempSrc:  Oral  Oral  Resp:  16  18  Height:      Weight:   179 lb 12.8 oz (81.557 kg)   SpO2:  100%  97%    Intake/Output Summary (Last 24 hours) at 03/27/15 0813 Last data filed at 03/27/15 0523  Gross per 24 hour  Intake    343 ml  Output   2000 ml  Net  -1657 ml   Filed Weights   03/25/15 0500 03/26/15 0458 03/27/15 0500  Weight: 178 lb 3.2 oz (80.831 kg) 179 lb 9.6 oz (81.466 kg) 179 lb 12.8 oz (81.557 kg)    PHYSICAL EXAM  General: Pleasant, NAD. Neuro: Alert and oriented X 3. Moves all extremities spontaneously. Psych: Normal affect. HEENT:  Normal  Neck: Supple without bruits or JVD. Lungs:  Resp regular and unlabored, CTA. Heart: Sinus tachycardia.  Grade 2/6 holosystolic apical murmur of mitral regurgitation.  No S3 gallop. Abdomen: Soft, non-tender, non-distended, BS + x 4.  Extremities: No clubbing, cyanosis or edema. DP/PT/Radials 2+ and equal bilaterally.  Accessory Clinical Findings  CBC  Recent Labs  03/26/15 1108   WBC 5.4  NEUTROABS 2.9  HGB 9.5*  HCT 30.6*  MCV 85.7  PLT 350   Basic Metabolic Panel  Recent Labs  03/26/15 1108  NA 139  K 3.3*  CL 109  CO2 25  GLUCOSE 117*  BUN 12  CREATININE 0.71  CALCIUM 8.5   Liver Function Tests No results for input(s): AST, ALT, ALKPHOS, BILITOT, PROT, ALBUMIN in the last 72 hours. No results for input(s): LIPASE, AMYLASE in the last 72 hours. Cardiac Enzymes No results for input(s): CKTOTAL, CKMB, CKMBINDEX, TROPONINI in the last 72 hours. BNP Invalid input(s): POCBNP D-Dimer No results for input(s): DDIMER in the last 72 hours. Hemoglobin A1C No results for input(s): HGBA1C in the last 72 hours. Fasting Lipid Panel No results for input(s): CHOL, HDL, LDLCALC, TRIG, CHOLHDL, LDLDIRECT in the last 72 hours. Thyroid Function Tests No results for input(s): TSH, T4TOTAL, T3FREE, THYROIDAB in the last 72 hours.  Invalid input(s): FREET3  TELE  Sinus tachycardia  ECG    Radiology/Studies  Dg Chest 2 View  03/26/2015   CLINICAL DATA:  Cough for 2 weeks.  CHF.  Prior smoker.  No fever.  EXAM: CHEST  2 VIEW  COMPARISON:  03/22/2015  FINDINGS: Improved scratch head there is improved aeration bilaterally. There is no focal parenchymal opacity, pleural effusion, or pneumothorax. The heart and mediastinal contours are unremarkable.  The osseous structures are unremarkable.  IMPRESSION: No active cardiopulmonary disease.   Electronically Signed   By: Elige Ko   On: 03/26/2015 09:35   Dg Chest 2 View  03/22/2015   CLINICAL DATA:  Cough.  Unable to sleep.  EXAM: CHEST  2 VIEW  COMPARISON:  06/22/2004  FINDINGS: There is cardiomegaly and vascular pedicle widening. Diffuse interstitial opacities with cephalized blood flow. No pleural effusion or pneumothorax.  IMPRESSION: CHF pattern.   Electronically Signed   By: Marnee Spring M.D.   On: 03/22/2015 03:06    ASSESSMENT AND PLAN  1.  Systolic heart failure and newly documented cardiomyopathy  with LVEF 35%. Symptomatically improving. Currently on Coreg and Hydralazine and nitrates.  2. Severe mitral regurgitation, possibly functional in setting of LV dilatation.  3. Hypertension, secondary causes being investigated by primary team (renin and aldosterone pending, 24 hour urine pending). Not on ACE-I yet pending studies. Was hypokalemic at baseline and is still hypokalemic.  4.Iron deficiency anemia. History of heavy menses.  Oral iron was added yesterday  Plan: She is still tachycardic.  We will increase her carvedilol to 25 mg twice a day.  Her anemia is also playing a role in her tachycardia. She is still hypokalemic.  Repletion per primary service. Serum renin/aldosterone levels pending.  24-hour urine for pheochromocytoma and for Cushing's disease was completed yesterday, results pending. Anticipate possible discharge tomorrow if patient remains stable.  We will consider adding ACE inhibitor or ARB once we have results of renin/aldosterone results back.  Signed, Cassell Clement MD

## 2015-03-28 LAB — BASIC METABOLIC PANEL
Anion gap: 9 (ref 5–15)
BUN: 11 mg/dL (ref 6–23)
CALCIUM: 8.7 mg/dL (ref 8.4–10.5)
CHLORIDE: 107 mmol/L (ref 96–112)
CO2: 25 mmol/L (ref 19–32)
Creatinine, Ser: 0.68 mg/dL (ref 0.50–1.10)
GFR calc Af Amer: >90 mL/min (ref >90)
Glucose, Bld: 96 mg/dL (ref 70–99)
Potassium: 3.6 mmol/L (ref 3.5–5.1)
Sodium: 141 mmol/L (ref 135–145)

## 2015-03-28 LAB — ALDOSTERONE + RENIN ACTIVITY W/ RATIO
ALDO / PRA Ratio: 8.3 (ref 0.0–30.0)
ALDOSTERONE: 2.9 ng/dL (ref 0.0–30.0)
PRA LC/MS/MS: 0.35 ng/mL/hr

## 2015-03-28 LAB — METANEPHRINES, URINE, 24 HOUR
METANEPHRINES UR: 161 ug/(24.h) (ref 58–203)
Metaneph Total, Ur: 1196 mcg/24 h — ABNORMAL HIGH (ref 182–739)
Normetanephrine, 24H Ur: 1035 mcg/24 h — ABNORMAL HIGH (ref 88–649)
Volume, Urine-METAN: 1500 mL

## 2015-03-28 MED ORDER — FUROSEMIDE 20 MG PO TABS
20.0000 mg | ORAL_TABLET | Freq: Every day | ORAL | Status: DC
  Administered 2015-03-28 – 2015-03-30 (×3): 20 mg via ORAL
  Filled 2015-03-28 (×3): qty 1

## 2015-03-28 MED ORDER — POTASSIUM CHLORIDE CRYS ER 20 MEQ PO TBCR
20.0000 meq | EXTENDED_RELEASE_TABLET | Freq: Two times a day (BID) | ORAL | Status: DC
  Administered 2015-03-28 – 2015-03-30 (×5): 20 meq via ORAL
  Filled 2015-03-28 (×5): qty 1

## 2015-03-28 NOTE — Progress Notes (Addendum)
[facility] Progress Note   Catherine Gross ZOX:[id number] DOB: December 20, 1970 DOA: 03/21/2015 PCP: No PCP Per Patient  Brief narrative: Catherine Gross is a 44 y.o. female with past medical history of smoking and preeclampsia who presented with cough and congestion starting a few days prior to admission. She was admitted for a CHF exacerbation and cardiology was consult it. Blood pressure was noted to be significantly elevated at 191/114.   Subjective: No complaints of shortness of breath palpitations or chest pain  Assessment/Plan: Principal Problem:   Acute systolic and diastolic heart failure/acute respiratory failure with hypoxia -EF found to be 35% with severe mitral regurgitation and mild to moderate atrial dilatation -Cardiology is assisting with management and have placed her on Lasix daily -Continue Coreg and hydralazine and Imdur -Will need Ace if aldosterone renin ration is normal  Active Problems:   Accelerated hypertension -As mentioned above continue Coreg/hydralazine and Imdur-further workup with renin and aldosterone levels and metanephrine pending    Sinus tachycardia -Continue Coreg at 25 mg twice a day- follow-up workup for pheochromocytoma    Hypokalemia - due Lasix and has been supplemented -Will need to be discharged home on a dose of daily potassium along with her Lasix   Appt with PCP: Code Status: Full code Family Communication:  Disposition Plan: Home hopefully tomorrow DVT prophylaxis: Heparin Consultants: Cardiology Procedures: 2-D echo: Left ventricle: The cavity size was mildly dilated. Wall thickness was increased in a pattern of mild LVH. Systolic function was moderately reduced. The estimated ejection fraction was in the range of 35%. - Aortic valve: There was mild regurgitation. - Mitral valve: There was severe regurgitation. - Left atrium: The atrium was mildly to moderately dilated. - Pulmonary arteries: PA peak pressure: 49 mm Hg  (S).  Antibiotics: Anti-infectives    None      Objective: Filed Weights   03/26/15 0458 03/27/15 0500 03/28/15 0553  Weight: 81.466 kg (179 lb 9.6 oz) 81.557 kg (179 lb 12.8 oz) 81 kg (178 lb 9.2 oz)    Intake/Output Summary (Last 24 hours) at 03/28/15 1623 Last data filed at 03/28/15 1256  Gross per 24 hour  Intake    240 ml  Output   2750 ml  Net  -2510 ml     Vitals Filed Vitals:   03/27/15 2223 03/28/15 0553 03/28/15 1255 03/28/15 1414  BP: 142/89 158/91 129/77 114/56  Pulse: 92 103 97 101  Temp: 98.2 F (36.8 C) 98 F (36.7 C) 98 F (36.7 C) 98 F (36.7 C)  TempSrc: Oral Oral Oral Oral  Resp: 17 16    Height:      Weight:  81 kg (178 lb 9.2 oz)    SpO2: 98% 98% 100% 100%    Exam:  General:  Pt is alert, not in acute distress  HEENT: No icterus, No thrush  Cardiovascular: regular rate and rhythm, S1/S2 No murmur, tachycardic  Respiratory: clear to auscultation bilaterally   Abdomen: Soft, +Bowel sounds, non tender, non distended, no guarding  MSK: No LE edema, cyanosis or clubbing  Data Reviewed: Basic Metabolic Panel:  Recent Labs Lab 03/22/15 0049 03/22/15 0523 03/23/15 0837 03/26/15 1108 03/28/15 0341  NA 143 138 136 139 141  K 3.4* 3.1* 3.5 3.3* 3.6  CL 106 112 105 109 107  CO2  --  GLUCOSE 129* 118* 127* 117* 96  BUN CREATININE 0.70 0.73 0.77 0.71 0.68  CALCIUM  --  8.1*  9.2 8.5 8.7   Liver Function Tests:  Recent Labs Lab 03/22/15 0523  AST 14  ALT 11  ALKPHOS 46  BILITOT 0.5  PROT 7.2  ALBUMIN 3.6   No results for input(s): LIPASE, AMYLASE in the last 168 hours. No results for input(s): AMMONIA in the last 168 hours. CBC:  Recent Labs Lab 03/22/15 0040 03/22/15 0049 03/22/15 0523 03/23/15 0837 03/26/15 1108  WBC 9.0  --  7.2 6.9 5.4  NEUTROABS 5.9  --   --   --  2.9  HGB 10.9* 12.6 9.2* 10.6* 9.5*  HCT 34.4* 37.0 28.9* 33.6* 30.6*  MCV 85.4  --  85.3 86.2 85.7  PLT 341  --  272  311 350   Cardiac Enzymes:  Recent Labs Lab 03/22/15 0523 03/22/15 1040 03/22/15 1735  TROPONINI 0.03 0.03 <0.03   BNP (last 3 results)  Recent Labs  03/22/15 0040  BNP 373.9*    ProBNP (last 3 results) No results for input(s): PROBNP in the last 8760 hours.  CBG: No results for input(s): GLUCAP in the last 168 hours.  No results found for this or any previous visit (from the past 240 hour(s)).   Studies:  Recent x-ray studies have been reviewed in detail by the Attending Physician  Scheduled Meds:  Scheduled Meds: . benzonatate  100 mg Oral TID  . carvedilol  25 mg Oral BID WC  . ferrous sulfate  325 mg Oral Q breakfast  . furosemide  20 mg Oral Daily  . heparin  5,000 Units Subcutaneous 3 times per day  . hydrALAZINE  50 mg Oral 4 times per day  . isosorbide mononitrate  15 mg Oral Daily  . potassium chloride  20 mEq Oral BID  . sodium chloride  3 mL Intravenous Q12H   Continuous Infusions:   Time spent on care of this patient: 35 minutes   Catherine Hertzog, MD 03/28/2015, 4:23 PM  LOS: 6 days   [facility] Office  [phone] Pager - Text Page per [email or web address]  If 7PM-7AM, please contact night-coverage [email or web address]

## 2015-03-28 NOTE — Progress Notes (Signed)
Patient Name: Catherine Gross Date of Encounter: 03/28/2015     Principal Problem:   Acute CHF Active Problems:   Accelerated hypertension   Cough   Sinus tachycardia   Hypokalemia   Normocytic anemia   Acute diastolic CHF (congestive heart failure)   Systolic and diastolic CHF, acute   Acute respiratory failure with hypoxia   CHF (congestive heart failure)    SUBJECTIVE  No chest pain or shortness of breath.  Her headache from yesterday has improved.  CURRENT MEDS . benzonatate  100 mg Oral TID  . carvedilol  25 mg Oral BID WC  . ferrous sulfate  325 mg Oral Q breakfast  . heparin  5,000 Units Subcutaneous 3 times per day  . hydrALAZINE  50 mg Oral 4 times per day  . isosorbide mononitrate  15 mg Oral Daily  . sodium chloride  3 mL Intravenous Q12H    OBJECTIVE  Filed Vitals:   03/27/15 1255 03/27/15 1612 03/27/15 2223 03/28/15 0553  BP: 127/77 119/75 142/89 158/91  Pulse: 90 101 92 103  Temp: 98.2 F (36.8 C)  98.2 F (36.8 C) 98 F (36.7 C)  TempSrc: Oral  Oral Oral  Resp: Height:      Weight:    178 lb 9.2 oz (81 kg)  SpO2: 99%  98% 98%    Intake/Output Summary (Last 24 hours) at 03/28/15 0805 Last data filed at 03/28/15 0555  Gross per 24 hour  Intake      0 ml  Output   1950 ml  Net  -1950 ml   Filed Weights   03/26/15 0458 03/27/15 0500 03/28/15 0553  Weight: 179 lb 9.6 oz (81.466 kg) 179 lb 12.8 oz (81.557 kg) 178 lb 9.2 oz (81 kg)    PHYSICAL EXAM  General: Pleasant, NAD. Neuro: Alert and oriented X 3. Moves all extremities spontaneously. Psych: Normal affect. HEENT:  Normal  Neck: Supple without bruits or JVD. Lungs:  Resp regular and unlabored, CTA. Heart: Sinus tachycardia.  Grade 2/6 holosystolic apical murmur of mitral regurgitation.  Her S3 gallop is again present. Abdomen: Soft, non-tender, non-distended, BS + x 4.  Extremities: No clubbing, cyanosis or edema. DP/PT/Radials 2+ and equal bilaterally.  Accessory  Clinical Findings  CBC  Recent Labs  03/26/15 1108  WBC 5.4  NEUTROABS 2.9  HGB 9.5*  HCT 30.6*  MCV 85.7  PLT 350   Basic Metabolic Panel  Recent Labs  03/26/15 1108 03/28/15 0341  NA 139 141  K 3.3* 3.6  CL 109 107  CO2 25 25  GLUCOSE 117* 96  BUN 12 11  CREATININE 0.71 0.68  CALCIUM 8.5 8.7   Liver Function Tests No results for input(s): AST, ALT, ALKPHOS, BILITOT, PROT, ALBUMIN in the last 72 hours. No results for input(s): LIPASE, AMYLASE in the last 72 hours. Cardiac Enzymes No results for input(s): CKTOTAL, CKMB, CKMBINDEX, TROPONINI in the last 72 hours. BNP Invalid input(s): POCBNP D-Dimer No results for input(s): DDIMER in the last 72 hours. Hemoglobin A1C No results for input(s): HGBA1C in the last 72 hours. Fasting Lipid Panel No results for input(s): CHOL, HDL, LDLCALC, TRIG, CHOLHDL, LDLDIRECT in the last 72 hours. Thyroid Function Tests No results for input(s): TSH, T4TOTAL, T3FREE, THYROIDAB in the last 72 hours.  Invalid input(s): FREET3  TELE  Sinus tachycardia  ECG    Radiology/Studies  Dg Chest 2 View  03/26/2015   CLINICAL DATA:  Cough for 2 weeks.  CHF.  Prior smoker.  No fever.  EXAM: CHEST  2 VIEW  COMPARISON:  03/22/2015  FINDINGS: Improved scratch head there is improved aeration bilaterally. There is no focal parenchymal opacity, pleural effusion, or pneumothorax. The heart and mediastinal contours are unremarkable.  The osseous structures are unremarkable.  IMPRESSION: No active cardiopulmonary disease.   Electronically Signed   By: Elige Ko   On: 03/26/2015 09:35   Dg Chest 2 View  03/22/2015   CLINICAL DATA:  Cough.  Unable to sleep.  EXAM: CHEST  2 VIEW  COMPARISON:  06/22/2004  FINDINGS: There is cardiomegaly and vascular pedicle widening. Diffuse interstitial opacities with cephalized blood flow. No pleural effusion or pneumothorax.  IMPRESSION: CHF pattern.   Electronically Signed   By: Marnee Spring M.D.   On:  03/22/2015 03:06    ASSESSMENT AND PLAN  1.  Systolic heart failure and newly documented cardiomyopathy with LVEF 35%. Symptomatically improving. Currently on Coreg and Hydralazine and nitrates.  She remains tachycardic at rest.  2. Severe mitral regurgitation, possibly functional in setting of LV dilatation.  3. Hypertension, secondary causes being investigated by primary team (renin and aldosterone pending, 24 hour urine pending). Not on ACE-I yet pending studies. Was hypokalemic at baseline and is still hypokalemic.  4.Iron deficiency anemia. History of heavy menses.  Oral iron was added yesterday  Plan: She is still tachycardic.  I will add furosemide 20 mg one daily.  Continue potassium repletion  Her anemia is also playing a role in her tachycardia.  Serum renin/aldosterone levels pending.  24-hour urine for pheochromocytoma and for Cushing's disease was completed yesterday, results pending. Anticipate possible discharge Thursday if patient remains stable.  We will consider adding ACE inhibitor or ARB once we have results of renin/aldosterone results back.  Signed, Cassell Clement MD

## 2015-03-29 LAB — CATECHOLAMINES, FRACTIONATED, URINE, 24 HOUR
CREATININE, URINE MG/DAY-CATEUR: 1.43 g/(24.h) (ref 0.63–2.50)
Catecholamines T: 137 mcg/24 h — ABNORMAL HIGH (ref 26–121)
Dopamine 24 Hr Urine: 179 mcg/24 h (ref 52–480)
Epinephrine 24 Hr Urine: 9 mcg/24 h (ref 2–24)
Norepinephrine 24 Hr Urine: 128 mcg/24 h — ABNORMAL HIGH (ref 15–100)
TOTAL URINE VOLUME: 1500 mL

## 2015-03-29 LAB — BASIC METABOLIC PANEL
ANION GAP: 5 (ref 5–15)
BUN: 11 mg/dL (ref 6–23)
CO2: 25 mmol/L (ref 19–32)
Calcium: 8.8 mg/dL (ref 8.4–10.5)
Chloride: 108 mmol/L (ref 96–112)
Creatinine, Ser: 0.63 mg/dL (ref 0.50–1.10)
GFR calc Af Amer: >90 mL/min (ref >90)
Glucose, Bld: 101 mg/dL — ABNORMAL HIGH (ref 70–99)
Potassium: 3.9 mmol/L (ref 3.5–5.1)
SODIUM: 138 mmol/L (ref 135–145)

## 2015-03-29 LAB — CORTISOL, URINE, FREE
CORTISOL (UR), FREE: 92.4 ug/(24.h) — AB (ref 4.0–50.0)
Creatinine, Urine mg/day-CORTUR: 1.6 g/(24.h) (ref 0.63–2.50)
Volume, Urine-CORTUR: 1500 mL

## 2015-03-29 MED ORDER — LOSARTAN POTASSIUM 50 MG PO TABS
50.0000 mg | ORAL_TABLET | Freq: Every day | ORAL | Status: DC
  Administered 2015-03-29: 50 mg via ORAL
  Filled 2015-03-29 (×2): qty 1

## 2015-03-29 MED ORDER — HYDRALAZINE HCL 25 MG PO TABS
25.0000 mg | ORAL_TABLET | Freq: Four times a day (QID) | ORAL | Status: DC
  Administered 2015-03-29 – 2015-03-30 (×5): 25 mg via ORAL
  Filled 2015-03-29 (×6): qty 1

## 2015-03-29 NOTE — Progress Notes (Signed)
Patient Name: Catherine Gross Date of Encounter: 03/29/2015     Principal Problem:   Acute CHF Active Problems:   Accelerated hypertension   Cough   Sinus tachycardia   Hypokalemia   Normocytic anemia   Acute diastolic CHF (congestive heart failure)   Systolic and diastolic CHF, acute   Acute respiratory failure with hypoxia   CHF (congestive heart failure)    SUBJECTIVE  No chest pain or shortness of breath.  He remains tachycardic.  Thyroid function is normal.  She did not walk in the hall yesterday. The results from the renin and aldosterone analysis have returned normal. The results from the 24-hour urine are pending  CURRENT MEDS . benzonatate  100 mg Oral TID  . carvedilol  25 mg Oral BID WC  . ferrous sulfate  325 mg Oral Q breakfast  . furosemide  20 mg Oral Daily  . heparin  5,000 Units Subcutaneous 3 times per day  . hydrALAZINE  50 mg Oral 4 times per day  . isosorbide mononitrate  15 mg Oral Daily  . potassium chloride  20 mEq Oral BID  . sodium chloride  3 mL Intravenous Q12H    OBJECTIVE  Filed Vitals:   03/28/15 1414 03/28/15 1716 03/28/15 2209 03/29/15 0530  BP: 114/56 142/80 118/69 149/88  Pulse: 101 100 95 102  Temp: 98 F (36.7 C)  98.4 F (36.9 C) 98.1 F (36.7 C)  TempSrc: Oral  Oral Oral  Resp:   16 17  Height:      Weight:    178 lb 2.1 oz (80.8 kg)  SpO2: 100%  99% 100%    Intake/Output Summary (Last 24 hours) at 03/29/15 0746 Last data filed at 03/29/15 0531  Gross per 24 hour  Intake    240 ml  Output   2800 ml  Net  -2560 ml   Filed Weights   03/27/15 0500 03/28/15 0553 03/29/15 0530  Weight: 179 lb 12.8 oz (81.557 kg) 178 lb 9.2 oz (81 kg) 178 lb 2.1 oz (80.8 kg)    PHYSICAL EXAM  General: Pleasant, NAD. Neuro: Alert and oriented X 3. Moves all extremities spontaneously. Psych: Normal affect. HEENT:  Normal  Neck: Supple without bruits or JVD. Lungs:  Resp regular and unlabored, CTA. Heart: Sinus tachycardia.  Grade  2/6 holosystolic apical murmur of mitral regurgitation.  Her S3 gallop is still present. Abdomen: Soft, non-tender, non-distended, BS + x 4.  Extremities: No clubbing, cyanosis or edema. DP/PT/Radials 2+ and equal bilaterally.  Accessory Clinical Findings  CBC  Recent Labs  03/26/15 1108  WBC 5.4  NEUTROABS 2.9  HGB 9.5*  HCT 30.6*  MCV 85.7  PLT 350   Basic Metabolic Panel  Recent Labs  03/28/15 0341 03/29/15 0355  NA 141 138  K 3.6 3.9  CL 107 108  CO2 25 25  GLUCOSE 96 101*  BUN 11 11  CREATININE 0.68 0.63  CALCIUM 8.7 8.8   Liver Function Tests No results for input(s): AST, ALT, ALKPHOS, BILITOT, PROT, ALBUMIN in the last 72 hours. No results for input(s): LIPASE, AMYLASE in the last 72 hours. Cardiac Enzymes No results for input(s): CKTOTAL, CKMB, CKMBINDEX, TROPONINI in the last 72 hours. BNP Invalid input(s): POCBNP D-Dimer No results for input(s): DDIMER in the last 72 hours. Hemoglobin A1C No results for input(s): HGBA1C in the last 72 hours. Fasting Lipid Panel No results for input(s): CHOL, HDL, LDLCALC, TRIG, CHOLHDL, LDLDIRECT in the last 72 hours. Thyroid Function  Tests No results for input(s): TSH, T4TOTAL, T3FREE, THYROIDAB in the last 72 hours.  Invalid input(s): FREET3  TELE  Sinus tachycardia  ECG    Radiology/Studies  Dg Chest 2 View  03/26/2015   CLINICAL DATA:  Cough for 2 weeks.  CHF.  Prior smoker.  No fever.  EXAM: CHEST  2 VIEW  COMPARISON:  03/22/2015  FINDINGS: Improved scratch head there is improved aeration bilaterally. There is no focal parenchymal opacity, pleural effusion, or pneumothorax. The heart and mediastinal contours are unremarkable.  The osseous structures are unremarkable.  IMPRESSION: No active cardiopulmonary disease.   Electronically Signed   By: Elige Ko   On: 03/26/2015 09:35   Dg Chest 2 View  03/22/2015   CLINICAL DATA:  Cough.  Unable to sleep.  EXAM: CHEST  2 VIEW  COMPARISON:  06/22/2004   FINDINGS: There is cardiomegaly and vascular pedicle widening. Diffuse interstitial opacities with cephalized blood flow. No pleural effusion or pneumothorax.  IMPRESSION: CHF pattern.   Electronically Signed   By: Marnee Spring M.D.   On: 03/22/2015 03:06    ASSESSMENT AND PLAN  1.  Systolic heart failure and newly documented cardiomyopathy with LVEF 35%. Symptomatically improving. Currently on Coreg and Hydralazine and nitrates.  She remains tachycardic at rest.  Will start losartan 50 mg daily for afterload reduction for her cardiomyopathy and mitral regurgitation  2. Severe mitral regurgitation, possibly functional in setting of LV dilatation.  Start ARB  3. Hypertension, results of 24-hour urine are still pending  4.Iron deficiency anemia. History of heavy menses.  Oral iron was added yesterday  Plan: Start ARB.  Would watch in hospital 1 more day.  Recheck electrolytes in a.m.  Anticipate home Friday.  She will need close follow-up.   Signed, Cassell Clement MD

## 2015-03-29 NOTE — Progress Notes (Signed)
[facility] Progress Note   Catherine Gross KGM:[id number] DOB: [birth year] DOA: 03/21/2015 PCP: No PCP Per Patient  Brief narrative: Catherine Gross is a 44 y.o. female with past medical history of smoking and preeclampsia who presented with cough and congestion starting a few days prior to admission. She was admitted for a CHF exacerbation and cardiology was consult it. Blood pressure was noted to be significantly elevated at 191/114.   Subjective: Doing well.  No complaints today.   Assessment/Plan: Principal Problem:   Acute systolic and diastolic heart failure/acute respiratory failure with hypoxia -EF found to be 35% with severe mitral regurgitation and mild to moderate atrial dilatation -Cardiology is assisting with management and have placed her on Lasix daily -Continue Coreg and hydralazine and Imdur -aldosterone renin ratio is normal- ARB started by cardiology  - cardiology would like to monitor her overnight  Active Problems:   Accelerated hypertension -As mentioned above continue Coreg/hydralazine and Imdur-further workup with renin and aldosterone levels and metanephrine pending    Sinus tachycardia -Continue Coreg at 25 mg twice a day- tachycardia improved this AM- follow-up workup for pheochromocytoma    Hypokalemia - due Lasix and has been supplemented -Will need to be discharged home on a dose of daily potassium along with her Lasix - receiving 20 meq BID at this time   Appt with PCP: Code Status: Full code Family Communication:  Disposition Plan: Home hopefully tomorrow DVT prophylaxis: Heparin Consultants: Cardiology Procedures: 2-D echo: Left ventricle: The cavity size was mildly dilated. Wall thickness was increased in a pattern of mild LVH. Systolic function was moderately reduced. The estimated ejection fraction was in the range of 35%. - Aortic valve: There was mild regurgitation. - Mitral valve: There was severe regurgitation. - Left atrium:  The atrium was mildly to moderately dilated. - Pulmonary arteries: PA peak pressure: 49 mm Hg (S).  Antibiotics: Anti-infectives    None      Objective: Filed Weights   03/27/15 0500 03/28/15 0553 03/29/15 0530  Weight: 81.557 kg (179 lb 12.8 oz) 81 kg (178 lb 9.2 oz) 80.8 kg (178 lb 2.1 oz)    Intake/Output Summary (Last 24 hours) at 03/29/15 1114 Last data filed at 03/29/15 0531  Gross per 24 hour  Intake    240 ml  Output   1900 ml  Net  -1660 ml     Vitals Filed Vitals:   03/28/15 1716 03/28/15 2209 03/29/15 0530 03/29/15 0754  BP: 142/80 118/69 149/88 145/86  Pulse: 100 95 102 92  Temp:  98.4 F (36.9 C) 98.1 F (36.7 C)   TempSrc:  Oral Oral   Resp:  16 17   Height:      Weight:   80.8 kg (178 lb 2.1 oz)   SpO2:  99% 100%     Exam:  General:  Pt is alert, not in acute distress  HEENT: No icterus, No thrush  Cardiovascular: regular rate and rhythm, S1/S2 No murmur, tachycardic  Respiratory: clear to auscultation bilaterally -   Abdomen: Soft, +Bowel sounds, non tender, non distended, no guarding  MSK: No LE edema, cyanosis or clubbing  Data Reviewed: Basic Metabolic Panel:  Recent Labs Lab 03/23/15 0837 03/26/15 1108 03/28/15 0341 03/29/15 0355  NA 136 139 141 138  K 3.5 3.3* 3.6 3.9  CL 105 109 107 108  CO2 GLUCOSE 127* 117* 96 101*  BUN CREATININE 0.77 0.71 0.68 0.63  CALCIUM 9.2 8.5  8.7 8.8   Liver Function Tests: No results for input(s): AST, ALT, ALKPHOS, BILITOT, PROT, ALBUMIN in the last 168 hours. No results for input(s): LIPASE, AMYLASE in the last 168 hours. No results for input(s): AMMONIA in the last 168 hours. CBC:  Recent Labs Lab 03/23/15 0837 03/26/15 1108  WBC 6.9 5.4  NEUTROABS  --  2.9  HGB 10.6* 9.5*  HCT 33.6* 30.6*  MCV 86.2 85.7  PLT 311 350   Cardiac Enzymes:  Recent Labs Lab 03/22/15 1735  TROPONINI <0.03   BNP (last 3 results)  Recent Labs  03/22/15 0040  BNP  373.9*    ProBNP (last 3 results) No results for input(s): PROBNP in the last 8760 hours.  CBG: No results for input(s): GLUCAP in the last 168 hours.  No results found for this or any previous visit (from the past 240 hour(s)).   Studies:  Recent x-ray studies have been reviewed in detail by the Attending Physician  Scheduled Meds:  Scheduled Meds: . benzonatate  100 mg Oral TID  . carvedilol  25 mg Oral BID WC  . ferrous sulfate  325 mg Oral Q breakfast  . furosemide  20 mg Oral Daily  . heparin  5,000 Units Subcutaneous 3 times per day  . hydrALAZINE  25 mg Oral 4 times per day  . isosorbide mononitrate  15 mg Oral Daily  . losartan  50 mg Oral Daily  . potassium chloride  20 mEq Oral BID  . sodium chloride  3 mL Intravenous Q12H   Continuous Infusions:   Time spent on care of this patient: 25 minutes   Jonique Kulig, MD 03/29/2015, 11:14 AM  LOS: 7 days   [facility] Office  [phone] Pager - Text Page per [email or web address]  If 7PM-7AM, please contact night-coverage [email or web address]

## 2015-03-30 DIAGNOSIS — I5021 Acute systolic (congestive) heart failure: Secondary | ICD-10-CM

## 2015-03-30 LAB — BASIC METABOLIC PANEL
Anion gap: 6 (ref 5–15)
BUN: 12 mg/dL (ref 6–23)
CO2: 26 mmol/L (ref 19–32)
CREATININE: 0.82 mg/dL (ref 0.50–1.10)
Calcium: 8.8 mg/dL (ref 8.4–10.5)
Chloride: 107 mmol/L (ref 96–112)
GFR calc Af Amer: >90 mL/min (ref >90)
GFR, EST NON AFRICAN AMERICAN: 87 mL/min — AB (ref >90)
Glucose, Bld: 95 mg/dL (ref 70–99)
Potassium: 4.1 mmol/L (ref 3.5–5.1)
Sodium: 139 mmol/L (ref 135–145)

## 2015-03-30 MED ORDER — FERROUS SULFATE 325 (65 FE) MG PO TABS: 325.0000 mg | ORAL_TABLET | Freq: Every day | ORAL | Status: DC

## 2015-03-30 MED ORDER — CARVEDILOL 25 MG PO TABS
25.0000 mg | ORAL_TABLET | Freq: Two times a day (BID) | ORAL | Status: DC
Start: 2015-03-30 — End: 2015-05-02

## 2015-03-30 MED ORDER — FUROSEMIDE 20 MG PO TABS: 20.0000 mg | ORAL_TABLET | Freq: Every day | ORAL | Status: DC

## 2015-03-30 MED ORDER — LOSARTAN POTASSIUM 50 MG PO TABS
100.0000 mg | ORAL_TABLET | Freq: Every day | ORAL | Status: DC
  Administered 2015-03-30: 100 mg via ORAL
  Filled 2015-03-30 (×2): qty 2

## 2015-03-30 MED ORDER — ISOSORBIDE MONONITRATE ER 30 MG PO TB24: 15.0000 mg | ORAL_TABLET | Freq: Every day | ORAL | Status: DC

## 2015-03-30 MED ORDER — POTASSIUM CHLORIDE CRYS ER 20 MEQ PO TBCR: 40.0000 meq | EXTENDED_RELEASE_TABLET | Freq: Every day | ORAL | Status: DC

## 2015-03-30 MED ORDER — LOSARTAN POTASSIUM 100 MG PO TABS: 100.0000 mg | ORAL_TABLET | Freq: Every day | ORAL | Status: DC

## 2015-03-30 MED ORDER — HYDRALAZINE HCL 25 MG PO TABS: 25.0000 mg | ORAL_TABLET | Freq: Four times a day (QID) | ORAL | Status: DC

## 2015-03-30 NOTE — Progress Notes (Signed)
Patient Name: Catherine Gross Date of Encounter: 03/30/2015     Principal Problem:   Acute CHF Active Problems:   Accelerated hypertension   Cough   Sinus tachycardia   Hypokalemia   Normocytic anemia   Acute diastolic CHF (congestive heart failure)   Systolic and diastolic CHF, acute   Acute respiratory failure with hypoxia   CHF (congestive heart failure)    SUBJECTIVE  No chest pain or shortness of breath.  She walked in the hall yesterday.  Thyroid function is normal.   The results from the renin and aldosterone analysis have returned normal. The results from the 24-hour urine show elevated 24-hour cortisol.  Metanephrines not yet reported.  CURRENT MEDS . benzonatate  100 mg Oral TID  . carvedilol  25 mg Oral BID WC  . ferrous sulfate  325 mg Oral Q breakfast  . furosemide  20 mg Oral Daily  . heparin  5,000 Units Subcutaneous 3 times per day  . hydrALAZINE  25 mg Oral 4 times per day  . isosorbide mononitrate  15 mg Oral Daily  . losartan  50 mg Oral Daily  . potassium chloride  20 mEq Oral BID  . sodium chloride  3 mL Intravenous Q12H    OBJECTIVE  Filed Vitals:   03/29/15 1654 03/29/15 2115 03/29/15 2327 03/30/15 0518  BP:  129/76 144/87 146/99  Pulse: 88 97 88 99  Temp:  98.1 F (36.7 C)  97.9 F (36.6 C)  TempSrc:  Oral  Oral  Resp:  16  15  Height:      Weight:    179 lb 7.3 oz (81.4 kg)  SpO2:  100%  100%    Intake/Output Summary (Last 24 hours) at 03/30/15 0745 Last data filed at 03/30/15 0518  Gross per 24 hour  Intake    960 ml  Output   1500 ml  Net   -540 ml   Filed Weights   03/28/15 0553 03/29/15 0530 03/30/15 0518  Weight: 178 lb 9.2 oz (81 kg) 178 lb 2.1 oz (80.8 kg) 179 lb 7.3 oz (81.4 kg)    PHYSICAL EXAM  General: Pleasant, NAD. Neuro: Alert and oriented X 3. Moves all extremities spontaneously. Psych: Normal affect. HEENT:  Normal  Neck: Supple without bruits or JVD. Lungs:  Resp regular and unlabored, CTA. Heart:  Sinus tachycardia.  Grade 2/6 holosystolic apical murmur of mitral regurgitation.  Soft S3 gallop Abdomen: Soft, non-tender, non-distended, BS + x 4.  Extremities: No clubbing, cyanosis or edema. DP/PT/Radials 2+ and equal bilaterally.  Accessory Clinical Findings  CBC No results for input(s): WBC, NEUTROABS, HGB, HCT, MCV, PLT in the last 72 hours. Basic Metabolic Panel  Recent Labs  03/29/15 0355 03/30/15 0357  NA 138 139  K 3.9 4.1  CL 108 107  CO2 25 26  GLUCOSE 101* 95  BUN 11 12  CREATININE 0.63 0.82  CALCIUM 8.8 8.8   Liver Function Tests No results for input(s): AST, ALT, ALKPHOS, BILITOT, PROT, ALBUMIN in the last 72 hours. No results for input(s): LIPASE, AMYLASE in the last 72 hours. Cardiac Enzymes No results for input(s): CKTOTAL, CKMB, CKMBINDEX, TROPONINI in the last 72 hours. BNP Invalid input(s): POCBNP D-Dimer No results for input(s): DDIMER in the last 72 hours. Hemoglobin A1C No results for input(s): HGBA1C in the last 72 hours. Fasting Lipid Panel No results for input(s): CHOL, HDL, LDLCALC, TRIG, CHOLHDL, LDLDIRECT in the last 72 hours. Thyroid Function Tests No results for input(s):  TSH, T4TOTAL, T3FREE, THYROIDAB in the last 72 hours.  Invalid input(s): FREET3  TELE  Sinus tachycardia  ECG    Radiology/Studies  Dg Chest 2 View  03/26/2015   CLINICAL DATA:  Cough for 2 weeks.  CHF.  Prior smoker.  No fever.  EXAM: CHEST  2 VIEW  COMPARISON:  03/22/2015  FINDINGS: Improved scratch head there is improved aeration bilaterally. There is no focal parenchymal opacity, pleural effusion, or pneumothorax. The heart and mediastinal contours are unremarkable.  The osseous structures are unremarkable.  IMPRESSION: No active cardiopulmonary disease.   Electronically Signed   By: Elige Ko   On: 03/26/2015 09:35   Dg Chest 2 View  03/22/2015   CLINICAL DATA:  Cough.  Unable to sleep.  EXAM: CHEST  2 VIEW  COMPARISON:  06/22/2004  FINDINGS: There is  cardiomegaly and vascular pedicle widening. Diffuse interstitial opacities with cephalized blood flow. No pleural effusion or pneumothorax.  IMPRESSION: CHF pattern.   Electronically Signed   By: Marnee Spring M.D.   On: 03/22/2015 03:06    ASSESSMENT AND PLAN  1.  Systolic heart failure and newly documented cardiomyopathy with LVEF 35%. Symptomatically improving. Currently on Coreg and Hydralazine and nitrates.  She remains tachycardic at rest.  Will increase losartan to 100 mg daily for afterload reduction for her cardiomyopathy and mitral regurgitation   2. Severe mitral regurgitation, possibly functional in setting of LV dilatation.  On ARB  3. Hypertension.  4.Iron deficiency anemia. History of heavy menses.  On Oral iron.  Plan: Okay for discharge today from cardiology standpoint.  Metanephrines are not yet back.  She may benefit from an endocrinology consultation either inpatient or as an outpatient regarding the elevated urinary cortisol level. Cardiology follow-up in one week with Dr. Patty Sermons or nurse practitioner/PA for follow-up of her dilated cardiomyopathy.  She will need a follow-up EKG and BMET and CBC at follow-up visit.   Signed, Cassell Clement MD

## 2015-03-30 NOTE — Progress Notes (Signed)
Patient discharged home with family, discharge instructions given and explained to patient and she verbalized understanding, denies any pain/distress. No wound noted, skin intact.

## 2015-03-30 NOTE — Discharge Summary (Signed)
Physician Discharge Summary  Catherine Gross ZOX:[id number] DOB: 1971-04-26 DOA: 03/21/2015  PCP: No PCP Per Patient  Admit date: 03/21/2015 Discharge date: 03/30/2015  Time spent: 50 minutes  Recommendations for Outpatient Follow-up:  Endocrine consult as outpt for elevated metanephrine levels- have discussed with Dr Romero Belling who will see the patient in his office bmet in 1 wk to ensure BUN/ Cr and K stable as she has been started on Lasix and KCL She has an appt with the [facility] in 3 days   Discharge Condition: stable Diet recommendation: low sodium, heart healthy  Discharge Diagnoses:  Principal Problem:   Acute diastolic CHF (congestive heart failure) Active Problems:   Accelerated hypertension   Sinus tachycardia   Hypokalemia   Normocytic anemia   Acute respiratory failure with hypoxia   History of present illness:  Catherine Gross is a 44 y.o. female with past medical history of smoking and preeclampsia who presented with cough and congestion starting a few days prior to admission. She was admitted for a CHF exacerbation and cardiology was consulted. Blood pressure was noted to be significantly elevated at 191/114.   Principal Problem:  Acute systolic and diastolic heart failure/acute respiratory failure with hypoxia -EF found to be 35% with severe mitral regurgitation and mild to moderate atrial dilatation -Cardiology is assisting with management  - has been started on Lasix daily by cardiology -Continue Coreg, hydralazine, Imdur and Losartan - out pt follow up with [facility] as needed  Active Problems:  Accelerated hypertension -As mentioned above continue Coreg, hydralazine, Losartan and Imdur -further workup with renin and aldosterone levels and metanephrine levels done - renin/aldosterone ratio norma - Metanephrines slightly elevated- recommend endocrine follow up as outpt   Sinus tachycardia -Continue Coreg at 25 mg twice a day- tachycardia  improved - HR 80-100 - further work up for  Pheochromocytoma has been done and elevated metanephrine levels noted - outpt referral to endocrinology made   Hypokalemia - due Lasix and has been supplemented - will d/c home on 40 meq KCL daily  Procedures:  ECHO  Consultations:  Cardiology  Discharge Exam: Filed Weights   03/28/15 0553 03/29/15 0530 03/30/15 0518  Weight: 81 kg (178 lb 9.2 oz) 80.8 kg (178 lb 2.1 oz) 81.4 kg (179 lb 7.3 oz)   Filed Vitals:   03/30/15 0945  BP: 152/96  Pulse: 102  Temp:   Resp:     General: AAO x 3, no distress Cardiovascular: RRR, no murmurs  Respiratory: clear to auscultation bilaterally GI: soft, non-tender, non-distended, bowel sound positive  Discharge Instructions You were cared for by a hospitalist during your hospital stay. If you have any questions about your discharge medications or the care you received while you were in the hospital after you are discharged, you can call the unit and asked to speak with the hospitalist on call if the hospitalist that took care of you is not available. Once you are discharged, your primary care physician will handle any further medical issues. Please note that NO REFILLS for any discharge medications will be authorized once you are discharged, as it is imperative that you return to your primary care physician (or establish a relationship with a primary care physician if you do not have one) for your aftercare needs so that they can reassess your need for medications and monitor your lab values.      Discharge Instructions    Diet - low sodium heart healthy    Complete by:  As directed      Increase activity slowly    Complete by:  As directed             Medication List    STOP taking these medications        guaiFENesin 600 MG 12 hr tablet  Commonly known as:  MUCINEX     HYDROcodone-acetaminophen 5-325 MG per tablet  Commonly known as:  NORCO/VICODIN     ibuprofen 600 MG tablet   Commonly known as:  ADVIL,MOTRIN     pseudoephedrine-acetaminophen 30-500 MG Tabs  Commonly known as:  TYLENOL SINUS      TAKE these medications        carvedilol 25 MG tablet  Commonly known as:  COREG  Take 1 tablet (25 mg total) by mouth 2 (two) times daily with a meal.     furosemide 20 MG tablet  Commonly known as:  LASIX  Take 1 tablet (20 mg total) by mouth daily.     hydrALAZINE 25 MG tablet  Commonly known as:  APRESOLINE  Take 1 tablet (25 mg total) by mouth every 6 (six) hours.     isosorbide mononitrate 30 MG 24 hr tablet  Commonly known as:  IMDUR  Take 0.5 tablets (15 mg total) by mouth daily.     losartan 100 MG tablet  Commonly known as:  COZAAR  Take 1 tablet (100 mg total) by mouth daily.     potassium chloride SA 20 MEQ tablet  Commonly known as:  K-DUR,KLOR-CON  Take 2 tablets (40 mEq total) by mouth daily.       No Known Allergies Follow-up Information    Follow up with [facility] On 04/02/2015.   Why:  9a/Dr. Raynald Blend check aldosterone, & renin levels$20 co pay/bring meds in bottle/photo id   Contact information:   [address] [facility] [address] [phone]       The results of significant diagnostics from this hospitalization (including imaging, microbiology, ancillary and laboratory) are listed below for reference.    Significant Diagnostic Studies: Dg Chest 2 View  03/26/2015   CLINICAL DATA:  Cough for 2 weeks.  CHF.  Prior smoker.  No fever.  EXAM: CHEST  2 VIEW  COMPARISON:  03/22/2015  FINDINGS: Improved scratch head there is improved aeration bilaterally. There is no focal parenchymal opacity, pleural effusion, or pneumothorax. The heart and mediastinal contours are unremarkable.  The osseous structures are unremarkable.  IMPRESSION: No active cardiopulmonary disease.   Electronically Signed   By: Elige Ko   On: 03/26/2015 09:35   Dg Chest 2 View  03/22/2015   CLINICAL  DATA:  Cough.  Unable to sleep.  EXAM: CHEST  2 VIEW  COMPARISON:  06/22/2004  FINDINGS: There is cardiomegaly and vascular pedicle widening. Diffuse interstitial opacities with cephalized blood flow. No pleural effusion or pneumothorax.  IMPRESSION: CHF pattern.   Electronically Signed   By: Marnee Spring M.D.   On: 03/22/2015 03:06    Microbiology: No results found for this or any previous visit (from the past 240 hour(s)).   Labs: Basic Metabolic Panel:  Recent Labs Lab 03/26/15 1108 03/28/15 0341 03/29/15 0355 03/30/15 0357  NA 139 141 138 139  K 3.3* 3.6 3.9 4.1  CL 109 107 108 107  CO2 25 25 25 26   GLUCOSE 117* 96 101* 95  BUN 12 11 11 12   CREATININE 0.71 0.68 0.63 0.82  CALCIUM 8.5 8.7 8.8  8.8   Liver Function Tests: No results for input(s): AST, ALT, ALKPHOS, BILITOT, PROT, ALBUMIN in the last 168 hours. No results for input(s): LIPASE, AMYLASE in the last 168 hours. No results for input(s): AMMONIA in the last 168 hours. CBC:  Recent Labs Lab 03/26/15 1108  WBC 5.4  NEUTROABS 2.9  HGB 9.5*  HCT 30.6*  MCV 85.7  PLT 350   Cardiac Enzymes: No results for input(s): CKTOTAL, CKMB, CKMBINDEX, TROPONINI in the last 168 hours. BNP: BNP (last 3 results)  Recent Labs  03/22/15 0040  BNP 373.9*    ProBNP (last 3 results) No results for input(s): PROBNP in the last 8760 hours.  CBG: No results for input(s): GLUCAP in the last 168 hours.     SignedCalvert Cantor, MD [facility] 03/30/2015, 11:22 AM

## 2015-04-02 ENCOUNTER — Encounter: Payer: Self-pay | Admitting: Family Medicine

## 2015-04-02 ENCOUNTER — Ambulatory Visit: Payer: Medicaid Other | Attending: Family Medicine | Admitting: Family Medicine

## 2015-04-02 VITALS — BP 155/94 | HR 82 | Temp 98.2°F | Resp 18 | Ht 65.0 in | Wt 177.0 lb

## 2015-04-02 DIAGNOSIS — I10 Essential (primary) hypertension: Secondary | ICD-10-CM | POA: Diagnosis not present

## 2015-04-02 DIAGNOSIS — D500 Iron deficiency anemia secondary to blood loss (chronic): Secondary | ICD-10-CM

## 2015-04-02 DIAGNOSIS — D649 Anemia, unspecified: Secondary | ICD-10-CM | POA: Insufficient documentation

## 2015-04-02 DIAGNOSIS — I5020 Unspecified systolic (congestive) heart failure: Secondary | ICD-10-CM | POA: Insufficient documentation

## 2015-04-02 DIAGNOSIS — Z720 Tobacco use: Secondary | ICD-10-CM

## 2015-04-02 DIAGNOSIS — R8299 Other abnormal findings in urine: Secondary | ICD-10-CM | POA: Insufficient documentation

## 2015-04-02 DIAGNOSIS — I420 Dilated cardiomyopathy: Secondary | ICD-10-CM

## 2015-04-02 DIAGNOSIS — Z87891 Personal history of nicotine dependence: Secondary | ICD-10-CM | POA: Diagnosis not present

## 2015-04-02 DIAGNOSIS — R825 Elevated urine levels of drugs, medicaments and biological substances: Secondary | ICD-10-CM

## 2015-04-02 DIAGNOSIS — I5021 Acute systolic (congestive) heart failure: Secondary | ICD-10-CM | POA: Diagnosis not present

## 2015-04-02 DIAGNOSIS — F17200 Nicotine dependence, unspecified, uncomplicated: Secondary | ICD-10-CM

## 2015-04-02 DIAGNOSIS — N920 Excessive and frequent menstruation with regular cycle: Secondary | ICD-10-CM | POA: Insufficient documentation

## 2015-04-02 LAB — CBC WITH DIFFERENTIAL/PLATELET
BASOS PCT: 1 % (ref 0–1)
Basophils Absolute: 0.1 10*3/uL (ref 0.0–0.1)
EOS ABS: 0.2 10*3/uL (ref 0.0–0.7)
Eosinophils Relative: 3 % (ref 0–5)
HCT: 33.9 % — ABNORMAL LOW (ref 36.0–46.0)
Hemoglobin: 10.5 g/dL — ABNORMAL LOW (ref 12.0–15.0)
LYMPHS PCT: 33 % (ref 12–46)
Lymphs Abs: 1.8 10*3/uL (ref 0.7–4.0)
MCH: 26.3 pg (ref 26.0–34.0)
MCHC: 31 g/dL (ref 30.0–36.0)
MCV: 84.8 fL (ref 78.0–100.0)
MPV: 9.8 fL (ref 8.6–12.4)
Monocytes Absolute: 0.4 10*3/uL (ref 0.1–1.0)
Monocytes Relative: 8 % (ref 3–12)
NEUTROS ABS: 3.1 10*3/uL (ref 1.7–7.7)
NEUTROS PCT: 55 % (ref 43–77)
Platelets: 457 10*3/uL — ABNORMAL HIGH (ref 150–400)
RBC: 4 MIL/uL (ref 3.87–5.11)
RDW: 17.2 % — AB (ref 11.5–15.5)
WBC: 5.6 10*3/uL (ref 4.0–10.5)

## 2015-04-02 LAB — BASIC METABOLIC PANEL
BUN: 16 mg/dL (ref 6–23)
CHLORIDE: 107 meq/L (ref 96–112)
CO2: 26 mEq/L (ref 19–32)
Calcium: 9.6 mg/dL (ref 8.4–10.5)
Creat: 0.83 mg/dL (ref 0.50–1.10)
Glucose, Bld: 103 mg/dL — ABNORMAL HIGH (ref 70–99)
POTASSIUM: 4.8 meq/L (ref 3.5–5.3)
SODIUM: 142 meq/L (ref 135–145)

## 2015-04-02 MED ORDER — NICOTINE 7 MG/24HR TD PT24: 7.0000 mg | MEDICATED_PATCH | Freq: Every day | TRANSDERMAL | Status: DC

## 2015-04-02 NOTE — Patient Instructions (Signed)

## 2015-04-02 NOTE — Progress Notes (Signed)
Patient hospitalized last week 4/14-4/22, for CHF, HTN. Patient reports feeling better. Patient currently has no pain. Patient quit smoking 4/14, would like nicotine patches to assist in remaining smoke free.

## 2015-04-02 NOTE — Progress Notes (Addendum)
Subjective:    Patient ID: Catherine Gross, female    DOB: 07-Oct-1971, 44 y.o.   MRN: [id number]  HPI  Admit date: 03/21/15 Discharge date:03/30/15  Catherine Gross had presented to [facility] ED on 03/21/15 with cough, shortness of breath and congestion and past medical history was notable for smoking and preeclampsia. Blood pressure was severely elevated at 191/114, she was tachycardic up to 122 and oxygen saturation was as low as 89%, troponins were 0.033, EKG revealed nonspecific ST changes, chest x-ray revealed CHF pattern, 2-D echo revealed ejection fraction of 35%, severe mitral regurg, dilated left atrium mild aortic regurg.  Cardiology was consulted and she was placed on IV Lasix and potassium, hydralazine and Imdur, losartan and Coreg. She was also hypokalemic and workup revealed a normal renin and aldosterone level but urine epinephrine was elevated ; she was anemic with a hemoglobin of 10. Her condition improved and she was scheduled to see endocrinology outpatient advised to also see cardiology as well.   Interval History: She reports doing well, denies any shortness of breath or chest pains. 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  Past Medical History  Diagnosis Date  . Preeclampsia     first pregnancy, not with susequent pregnancy    Past Surgical History  Procedure Laterality Date  . Cesarean section      No Known Allergies  Current Outpatient Prescriptions on File Prior to Visit  Medication Sig Dispense Refill  . carvedilol (COREG) 25 MG tablet Take 1 tablet (25 mg total) by mouth 2 (two) times daily with a meal. 60 tablet 0  . furosemide (LASIX) 20 MG tablet Take 1 tablet (20 mg total) by mouth daily. 30 tablet 0  . hydrALAZINE (APRESOLINE) 25 MG tablet Take 1 tablet (25 mg total) by mouth every 6 (six) hours. 120 tablet 0  . isosorbide mononitrate (IMDUR) 30 MG 24 hr tablet  Take 0.5 tablets (15 mg total) by mouth daily. 60 tablet 0  . losartan (COZAAR) 100 MG tablet Take 1 tablet (100 mg total) by mouth daily. 30 tablet 0  . potassium chloride SA (K-DUR,KLOR-CON) 20 MEQ tablet Take 2 tablets (40 mEq total) by mouth daily. 30 tablet 0   No current facility-administered medications on file prior to visit.      Review of Systems  Constitutional: Negative for activity change, appetite change and fatigue.  HENT: Negative for congestion, sinus pressure and sore throat.   Eyes: Negative for visual disturbance.  Respiratory: Negative for cough, chest tightness, shortness of breath and wheezing.   Cardiovascular: Negative for chest pain and palpitations.  Gastrointestinal: Negative for abdominal pain, constipation and abdominal distention.  Endocrine: Negative for polydipsia.  Genitourinary: Negative for dysuria and frequency.  Musculoskeletal: Negative for back pain and arthralgias.  Skin: Negative for rash.  Neurological: Negative for tremors, light-headedness and numbness.  Hematological: Does not bruise/bleed easily.  Psychiatric/Behavioral: Negative for behavioral problems and agitation.        Objective: Filed Vitals:   04/02/15 0910  BP: 155/94  Pulse: 82  Temp: 98.2 F (36.8 C)  Resp: 18      Physical Exam  Constitutional: She is oriented to person, place, and time. She appears well-developed and well-nourished. No distress.  HENT:  Head: Normocephalic.  Right Ear: External ear normal.  Left Ear: External ear normal.  Nose: Nose normal.  Mouth/Throat: Oropharynx is clear  and moist.  Eyes: Conjunctivae and EOM are normal. Pupils are equal, round, and reactive to light.  Neck: Normal range of motion. No JVD present.  Cardiovascular: Normal rate, regular rhythm, normal heart sounds and intact distal pulses.  Exam reveals no gallop.   No murmur heard. Pulmonary/Chest: Effort normal and breath sounds normal. No respiratory distress. She has no  wheezes. She has no rales. She exhibits no tenderness.  Abdominal: Soft. Bowel sounds are normal. She exhibits no distension and no mass. There is no tenderness.  Musculoskeletal: Normal range of motion. She exhibits no edema or tenderness.  Neurological: She is alert and oriented to person, place, and time. She has normal reflexes.  Skin: Skin is warm and dry. She is not diaphoretic.  Psychiatric: She has a normal mood and affect.     CBC Latest Ref Rng 03/26/2015 03/23/2015 03/22/2015  WBC 4.0 - 10.5 K/uL 5.4 6.9 7.2  Hemoglobin 12.0 - 15.0 g/dL 1.6(X) 10.6(L) 9.2(L)  Hematocrit 36.0 - 46.0 % 30.6(L) 33.6(L) 28.9(L)  Platelets 150 - 400 K/uL 350 311 272      Transthoracic Echocardiography  Patient:  Catherine Gross MR #:    [id number] Study Date: 03/22/2015 Gender:   F Age:    43 Height:   165.1 cm Weight:   82.6 kg BSA:    1.97 Gross^2 Pt. Status: Room:    [address]  Manson Passey Gross ORDERING   Catherine Gross REFERRING  Catherine Gross PERFORMING  Catherine Gross SONOGRAPHER Catherine Gross  cc:  ------------------------------------------------------------------- LV EF: 35% -  40%  ------------------------------------------------------------------- Indications:   CHF - 428.0.  ------------------------------------------------------------------- History:  PMH: Tachycardia. Risk factors: Hypertension.  ------------------------------------------------------------------- Study Conclusions  - Left ventricle: The cavity size was mildly dilated. Wall thickness was increased in a pattern of mild LVH. Systolic function was moderately reduced. The estimated ejection fraction was in the range of 35%. - Aortic valve: There was mild regurgitation. - Mitral valve: There was severe regurgitation. - Left atrium: The atrium was mildly to moderately dilated. - Pulmonary arteries: PA peak pressure: 49 mm Hg (S).  Transthoracic  echocardiography. Gross-mode, complete 2D, spectral Doppler, and color Doppler. Birthdate: Patient birthdate: 1970-12-28. Age: Patient is 44 yr old. Sex: Gender: female. BMI: 30.3 kg/Gross^2. Blood pressure:   159/100 Patient status: Gross. Study date: Study date: 03/22/2015. Study time: 01:10 PM. Location: Bedside.  -------------------------------------------------------------------  ------------------------------------------------------------------- Left ventricle: The cavity size was mildly dilated. Wall thickness was increased in a pattern of mild LVH. Systolic function was moderately reduced. The estimated ejection fraction was in the range of 35%.  ------------------------------------------------------------------- Aortic valve:  Structurally normal valve.  Cusp separation was normal. Doppler: Transvalvular velocity was within the normal range. There was no stenosis. There was mild regurgitation.  ------------------------------------------------------------------- Mitral valve:  Mildly thickened leaflets . Leaflet separation was normal. Doppler: Transvalvular velocity was within the normal range. There was no evidence for stenosis. There was severe regurgitation.  Peak gradient (D): 12 mm Hg.  ------------------------------------------------------------------- Left atrium: The atrium was mildly to moderately dilated.  ------------------------------------------------------------------- Right ventricle: The cavity size was normal. Wall thickness was normal. Pacer wire or catheter noted in right ventricle. Systolic function was normal.  ------------------------------------------------------------------- Pulmonic valve:  Structurally normal valve.  Cusp separation was normal. Doppler: Transvalvular velocity was within the normal range. There was no regurgitation.  ------------------------------------------------------------------- Tricuspid  valve:  Structurally normal valve.  Leaflet separation was normal. Doppler: Transvalvular velocity was within the normal range. There was mild regurgitation.  -------------------------------------------------------------------  Right atrium: The atrium was normal in size.  ------------------------------------------------------------------- Pericardium: There was no pericardial effusion.  ------------------------------------------------------------------- Measurements  Left ventricle               Value    Reference LV ID, ED, PLAX chordal       (H)   52.5 mm   43 - 52 LV ID, ES, PLAX chordal       (H)   46.8 mm   23 - 38 LV fx shortening, PLAX chordal   (L)   11  %   >=29 LV PW thickness, ED             13.2 mm   --------- IVS/LV PW ratio, ED             1.02     <=1.3 Stroke volume, 2D              49  ml   --------- Stroke volume/bsa, 2D            25  ml/Gross^2 --------- LV e&', lateral               8.81 cm/s  --------- LV E/e&', lateral              19.3     --------- LV e&', medial                10.7 cm/s  --------- LV E/e&', medial               15.89    --------- LV e&', average               9.76 cm/s  --------- LV E/e&', average              17.43    ---------  Ventricular septum             Value    Reference IVS thickness, ED              13.4 mm   ---------  LVOT                    Value    Reference LVOT ID, S                 21  mm   --------- LVOT area                  3.46 cm^2  --------- LVOT peak velocity, S            99.1 cm/s  --------- LVOT mean velocity, S            56.6 cm/s  --------- LVOT VTI,  S                 14.3 cm   --------- LVOT peak gradient, S            4   mm Hg ---------  Aorta                    Value    Reference Aortic root ID, ED             34  mm   ---------  Left atrium                 Value    Reference LA ID, A-P, ES  42  mm   --------- LA ID/bsa, A-P               2.13 cm/Gross^2 <=2.2 LA volume, S                82.5 ml   --------- LA volume/bsa, S              41.8 ml/Gross^2 --------- LA volume, ES, 1-p A4C           75.3 ml   --------- LA volume/bsa, ES, 1-p A4C         38.2 ml/Gross^2 --------- LA volume, ES, 1-p A2C           83.4 ml   --------- LA volume/bsa, ES, 1-p A2C         42.3 ml/Gross^2 ---------  Mitral valve                Value    Reference Mitral E-wave peak velocity         170  cm/s  --------- Mitral deceleration time      (L)   135  ms   150 - 230 Mitral peak gradient, D           12  mm Hg --------- Mitral maximal regurg velocity,       637  cm/s  --------- PISA Mitral regurg VTI, PISA           188  cm   ---------  Pulmonary arteries             Value    Reference PA pressure, S, DP         (H)   49  mm Hg <=30  Tricuspid valve               Value    Reference Tricuspid regurg peak velocity       338  cm/s  --------- Tricuspid peak RV-RA gradient        46  mm Hg ---------  Systemic veins               Value    Reference Estimated CVP                3   mm Hg ---------  Right ventricle               Value    Reference RV ID, ED, PLAX               27.5 mm   19 - 38 RV  pressure, S, DP         (H)   49  mm Hg <=30 RV s&', lateral, S              15.6 cm/s  ---------  Legend: (L) and (H) mark values outside specified reference range.  ------------------------------------------------------------------- Prepared and Electronically Authenticated by  Dietrich Pates, Gross.D. 2016-04-14T18:25:23     Assessment & Plan:  44 year old female with newly diagnosed systolic heart failure and cardiomyopathy and uncontrolled hypertension with incidental findings of elevated metanephrines in the urine currently doing well on medications.  Systolic heart failure and dilated cardiomyopathy:  Left ventricular ejection fraction 35% Currently asymptomatic Continue Coreg, Lasix and potassium as prescribed; basic metabolic panel sent off to evaluate potassium level. Called cardiology office to obtain appointment for patient scheduled for 04/11/15. Educated on daily weights, restricting fluid intake to 2 L per day, low-sodium diet.   Essential hypertension: Uncontrolled  She is yet to take her morning dose of antihypertensive. Blood pressure will be reassessed at the next office visit.   Anemia: Likely secondary to menorrhagia; CBC sent off today.  Smoking: Patient has quit and is requesting nicotine patches to enable her to abstain. Spent 3 minutes counseling the patient on the advantages of quitting smoking and encouraging her to continue abstaining.  Elevated metanephrines: Scheduled to see endocrinology on 04/05/2015.  Short-term disability form completed today and she has been released to return back to work.

## 2015-04-03 ENCOUNTER — Telehealth: Payer: Self-pay | Admitting: Family Medicine

## 2015-04-03 ENCOUNTER — Telehealth: Payer: Self-pay | Admitting: General Practice

## 2015-04-03 NOTE — Telephone Encounter (Signed)
Called patient to let her know FMLA paperwork is ready for pick-up.

## 2015-04-03 NOTE — Telephone Encounter (Signed)
Patient was contacted to inform her that [employer] M.D has signed off on her FMLA Paperwork and is ready to be picked up;

## 2015-04-04 LAB — VMA AND HVA, 24 HR UR
24 Hour urine volume (VMAHVA): 1500 mL/24 h
Creatinine, Urine mg/day-VMAUR: 1.56 g/(24.h) (ref 0.63–2.50)
HOMOVANILLIC ACID 24H UR: 2.7 mg/(24.h) (ref 1.6–7.5)
VMA 24H UR ADULT: 6.5 mg/(24.h) — AB (ref <=6.0)

## 2015-04-05 ENCOUNTER — Ambulatory Visit: Payer: MEDICAID | Attending: Family Medicine

## 2015-04-05 ENCOUNTER — Encounter: Payer: Self-pay | Admitting: Endocrinology

## 2015-04-05 ENCOUNTER — Ambulatory Visit (INDEPENDENT_AMBULATORY_CARE_PROVIDER_SITE_OTHER): Payer: Medicaid Other | Admitting: Endocrinology

## 2015-04-05 VITALS — BP 130/84 | HR 95 | Temp 98.0°F | Ht 65.0 in | Wt 176.0 lb

## 2015-04-05 DIAGNOSIS — I10 Essential (primary) hypertension: Secondary | ICD-10-CM | POA: Diagnosis not present

## 2015-04-05 NOTE — Progress Notes (Signed)
Subjective:    Patient ID: Catherine Gross, female    DOB: 1971-03-30, 44 y.o.   MRN: [id number]  HPI Pt wants to minimize health expenditures, as no health insurance.  Pt was recently in the hospital for CHF exacerbation.  She reports few years of intermittent moderate headache throughout the head, and assoc palpitations.   Past Medical History  Diagnosis Date  . Preeclampsia     first pregnancy, not with susequent pregnancy    Past Surgical History  Procedure Laterality Date  . Cesarean section      History   Social History  . Marital Status: Married    Spouse Name: N/A  . Number of Children: N/A  . Years of Education: N/A   Occupational History  . Not on file.   Social History Main Topics  . Smoking status: Former Smoker -- 0.50 packs/day for 20 years    Quit date: 03/22/2015  . Smokeless tobacco: Not on file  . Alcohol Use: No  . Drug Use: No  . Sexual Activity: Not on file   Other Topics Concern  . Not on file   Social History Narrative    Current Outpatient Prescriptions on File Prior to Visit  Medication Sig Dispense Refill  . carvedilol (COREG) 25 MG tablet Take 1 tablet (25 mg total) by mouth 2 (two) times daily with a meal. 60 tablet 0  . furosemide (LASIX) 20 MG tablet Take 1 tablet (20 mg total) by mouth daily. 30 tablet 0  . hydrALAZINE (APRESOLINE) 25 MG tablet Take 1 tablet (25 mg total) by mouth every 6 (six) hours. 120 tablet 0  . isosorbide mononitrate (IMDUR) 30 MG 24 hr tablet Take 0.5 tablets (15 mg total) by mouth daily. 60 tablet 0  . losartan (COZAAR) 100 MG tablet Take 1 tablet (100 mg total) by mouth daily. 30 tablet 0  . nicotine (NICODERM CQ) 7 mg/24hr patch Place 1 patch (7 mg total) onto the skin daily. 28 patch 0  . potassium chloride SA (K-DUR,KLOR-CON) 20 MEQ tablet Take 2 tablets (40 mEq total) by mouth daily. 30 tablet 0   No current facility-administered medications on file prior to visit.    No Known Allergies  Family  History  Problem Relation Age of Onset  . Heart failure Father   . Hypertension Father     BP 130/84 mmHg  Pulse 95  Temp(Src) 98 F (36.7 C) (Oral)  Ht 5\' 5"  (1.651 m)  Wt 176 lb (79.833 kg)  BMI 29.29 kg/m2  SpO2 96%  LMP 03/26/2015   Review of Systems denies seizure, flushing, pallor, n/v, syncope, diarrhea, weight loss, chest pain, anxiety, visual loss, fever, arthralgias, rhinorrhea, easy bruising, and excessive diaphoresis.      Objective:   Physical Exam VS: see vs page GEN: no distress HEAD: head: no deformity eyes: no periorbital swelling, no proptosis external nose and ears are normal mouth: no lesion seen NECK: supple, thyroid is not enlarged CHEST WALL: no deformity LUNGS:  Clear to auscultation.  CV: reg rate and rhythm, no murmur. ABD: abdomen is soft, nontender.  no hepatosplenomegaly.  not distended.  no hernia.  MUSCULOSKELETAL: muscle bulk and strength are grossly normal.  no obvious joint swelling.  gait is normal and steady EXTEMITIES: no deformity.  no ulcer on the feet.  feet are of normal color and temp.  no edema PULSES: dorsalis pedis intact bilat.  no carotid bruit NEURO:  cn 2-12 grossly intact.   readily moves all  4's.  sensation is intact to touch on the feet SKIN:  Normal texture and temperature.  No rash or suspicious lesion is visible.   NODES:  None palpable at the neck PSYCH: alert, well-oriented.  Does not appear anxious nor depressed.   (i reviewed labs from hospital: catecholamines are mildly abnormal)     Assessment & Plan:  Headache/palpitatins, new, uncertain etiology.  We'll repeat urine catecholamines.    Patient is advised the following: Patient Instructions  Please recheck the 24-HR urine tests at [facility]. If these are normal, no further hormonal testing is needed.

## 2015-04-05 NOTE — Patient Instructions (Signed)
Please recheck the 24-HR urine tests at [facility]. If these are normal, no further hormonal testing is needed.

## 2015-04-09 ENCOUNTER — Ambulatory Visit: Payer: Self-pay | Attending: Family Medicine | Admitting: Family Medicine

## 2015-04-09 ENCOUNTER — Encounter: Payer: Self-pay | Admitting: Family Medicine

## 2015-04-09 VITALS — BP 123/77 | HR 90 | Temp 98.5°F | Resp 16 | Ht 65.0 in | Wt 177.0 lb

## 2015-04-09 DIAGNOSIS — I5021 Acute systolic (congestive) heart failure: Secondary | ICD-10-CM

## 2015-04-09 NOTE — Progress Notes (Signed)
Need FMLA paperwork

## 2015-04-09 NOTE — Progress Notes (Signed)
   Subjective:    Patient ID: Catherine Gross, female    DOB: 07/31/1971, 44 y.o.   MRN: [id number] CC: f/u systolic CHF, discuss intermittent leave paperwork  HPI  1. Systolic CHF: compliant with regimen. Needs written time off for doctor appts. Weight 176-177# daily.   Soc Hx: non smoker  Review of Systems  Constitutional: Negative.   Respiratory: Negative.   Cardiovascular: Negative.        Objective:   Physical Exam BP 123/77 mmHg  Pulse 90  Temp(Src) 98.5 F (36.9 C) (Oral)  Resp 16  Ht 5\' 5"  (1.651 m)  Wt 177 lb (80.287 kg)  BMI 29.45 kg/m2  SpO2 98%  LMP 03/26/2015  BP Readings from Last 3 Encounters:  04/05/15 130/84  04/02/15 155/94  03/30/15 127/80   Wt Readings from Last 3 Encounters:  04/05/15 176 lb (79.833 kg)  04/02/15 177 lb (80.287 kg)  03/30/15 179 lb 7.3 oz (81.4 kg)   General appearance: alert, cooperative and no distress Lungs: clear to auscultation bilaterally Heart: regular rate and rhythm, S1, S2 normal, no murmur, click, rub or gallop Extremities: extremities normal, atraumatic, no cyanosis or edema  ECHO 03/22/15 Left ventricle: The cavity size was mildly dilated. Wall thickness was increased in a pattern of mild LVH. Systolic function was moderately reduced. The estimated ejection fraction was in the range of 35%. - Aortic valve: There was mild regurgitation. - Mitral valve: There was severe regurgitation. - Left atrium: The atrium was mildly to moderately dilated. - Pulmonary arteries: PA peak pressure: 49 mm Hg (S).     Assessment & Plan:

## 2015-04-09 NOTE — Assessment & Plan Note (Signed)
A: BP at goal. Weight stable. P: Continue current regimen Keep cardiology f/u

## 2015-04-09 NOTE — Patient Instructions (Signed)
Catherine Gross,  Thank you for coming in today. It was a pleasure meeting you. I look forward to being your primary doctor.  1. Intermittent leave 3 times every days for 4 hrs.   2. CHF monitoring: Low salt diet Monitor weight. If weight is up more than 3 lbs from the previous day, take an extra dose of lasix.   F/u on 05/02/15 for CHF f.u and pap smear  Dr. Armen Pickup   Low-Sodium Eating Plan Sodium raises blood pressure and causes water to be held in the body. Getting less sodium from food will help lower your blood pressure, reduce any swelling, and protect your heart, liver, and kidneys. We get sodium by adding salt (sodium chloride) to food. Most of our sodium comes from canned, boxed, and frozen foods. Restaurant foods, fast foods, and pizza are also very high in sodium. Even if you take medicine to lower your blood pressure or to reduce fluid in your body, getting less sodium from your food is important. WHAT IS MY PLAN? Most people should limit their sodium intake to 2,300 mg a day. Your health care provider recommends that you limit your sodium intake to ____1500_____ a day.  WHAT DO I NEED TO KNOW ABOUT THIS EATING PLAN? For the low-sodium eating plan, you will follow these general guidelines:  Choose foods with a % Daily Value for sodium of less than 5% (as listed on the food label).   Use salt-free seasonings or herbs instead of table salt or sea salt.   Check with your health care provider or pharmacist before using salt substitutes.   Eat fresh foods.  Eat more vegetables and fruits.  Limit canned vegetables. If you do use them, rinse them well to decrease the sodium.   Limit cheese to 1 oz (28 g) per day.   Eat lower-sodium products, often labeled as "lower sodium" or "no salt added."  Avoid foods that contain monosodium glutamate (MSG). MSG is sometimes added to [place] food and some canned foods.  Check food labels (Nutrition Facts labels) on foods to learn  how much sodium is in one serving.  Eat more home-cooked food and less restaurant, buffet, and fast food.  When eating at a restaurant, ask that your food be prepared with less salt or none, if possible.  HOW DO I READ FOOD LABELS FOR SODIUM INFORMATION? The Nutrition Facts label lists the amount of sodium in one serving of the food. If you eat more than one serving, you must multiply the listed amount of sodium by the number of servings. Food labels may also identify foods as:  Sodium free--Less than 5 mg in a serving.  Very low sodium--35 mg or less in a serving.  Low sodium--140 mg or less in a serving.  Light in sodium--50% less sodium in a serving. For example, if a food that usually has 300 mg of sodium is changed to become light in sodium, it will have 150 mg of sodium.  Reduced sodium--25% less sodium in a serving. For example, if a food that usually has 400 mg of sodium is changed to reduced sodium, it will have 300 mg of sodium. WHAT FOODS CAN I EAT? Grains Low-sodium cereals, including oats, puffed wheat and rice, and shredded wheat cereals. Low-sodium crackers. Unsalted rice and pasta. Lower-sodium bread.  Vegetables Frozen or fresh vegetables. Low-sodium or reduced-sodium canned vegetables. Low-sodium or reduced-sodium tomato sauce and paste. Low-sodium or reduced-sodium tomato and vegetable juices.  Fruits Fresh, frozen, and canned fruit.  Fruit juice.  Meat and Other Protein Products Low-sodium canned tuna and salmon. Fresh or frozen meat, poultry, seafood, and fish. Lamb. Unsalted nuts. Dried beans, peas, and lentils without added salt. Unsalted canned beans. Homemade soups without salt. Eggs.  Dairy Milk. Soy milk. Ricotta cheese. Low-sodium or reduced-sodium cheeses. Yogurt.  Condiments Fresh and dried herbs and spices. Salt-free seasonings. Onion and garlic powders. Low-sodium varieties of mustard and ketchup. Lemon juice.  Fats and Oils Reduced-sodium  salad dressings. Unsalted butter.  Other Unsalted popcorn and pretzels.  The items listed above may not be a complete list of recommended foods or beverages. Contact your dietitian for more options. WHAT FOODS ARE NOT RECOMMENDED? Grains Instant hot cereals. Bread stuffing, pancake, and biscuit mixes. Croutons. Seasoned rice or pasta mixes. Noodle soup cups. Boxed or frozen macaroni and cheese. Self-rising flour. Regular salted crackers. Vegetables Regular canned vegetables. Regular canned tomato sauce and paste. Regular tomato and vegetable juices. Frozen vegetables in sauces. Salted french fries. Olives. Rosita Fire. Relishes. Sauerkraut. Salsa. Meat and Other Protein Products Salted, canned, smoked, spiced, or pickled meats, seafood, or fish. Bacon, ham, sausage, hot dogs, corned beef, chipped beef, and packaged luncheon meats. Salt pork. Jerky. Pickled herring. Anchovies, regular canned tuna, and sardines. Salted nuts. Dairy Processed cheese and cheese spreads. Cheese curds. Blue cheese and cottage cheese. Buttermilk.  Condiments Onion and garlic salt, seasoned salt, table salt, and sea salt. Canned and packaged gravies. Worcestershire sauce. Tartar sauce. Barbecue sauce. Teriyaki sauce. Soy sauce, including reduced sodium. Steak sauce. Fish sauce. Oyster sauce. Cocktail sauce. Horseradish. Regular ketchup and mustard. Meat flavorings and tenderizers. Bouillon cubes. Hot sauce. Tabasco sauce. Marinades. Taco seasonings. Relishes. Fats and Oils Regular salad dressings. Salted butter. Margarine. Ghee. Bacon fat.  Other Potato and tortilla chips. Corn chips and puffs. Salted popcorn and pretzels. Canned or dried soups. Pizza. Frozen entrees and pot pies.  The items listed above may not be a complete list of foods and beverages to avoid. Contact your dietitian for more information. Document Released: 05/16/2002 Document Revised: 11/29/2013 Document Reviewed: 09/28/2013 [facility] Patient  Information 2015 [place], [place]. This information is not intended to replace advice given to you by your health care provider. Make sure you discuss any questions you have with your health care provider.

## 2015-04-11 ENCOUNTER — Encounter: Payer: Self-pay | Admitting: Nurse Practitioner

## 2015-04-11 ENCOUNTER — Ambulatory Visit (INDEPENDENT_AMBULATORY_CARE_PROVIDER_SITE_OTHER): Payer: Self-pay | Admitting: Nurse Practitioner

## 2015-04-11 VITALS — BP 110/60 | HR 89 | Ht 65.0 in | Wt 178.8 lb

## 2015-04-11 DIAGNOSIS — I420 Dilated cardiomyopathy: Secondary | ICD-10-CM

## 2015-04-11 DIAGNOSIS — R0600 Dyspnea, unspecified: Secondary | ICD-10-CM

## 2015-04-11 DIAGNOSIS — I5022 Chronic systolic (congestive) heart failure: Secondary | ICD-10-CM

## 2015-04-11 LAB — BASIC METABOLIC PANEL
BUN: 10 mg/dL (ref 6–23)
CO2: 29 mEq/L (ref 19–32)
Calcium: 9.3 mg/dL (ref 8.4–10.5)
Chloride: 105 mEq/L (ref 96–112)
Creatinine, Ser: 0.84 mg/dL (ref 0.40–1.20)
GFR: 95.33 mL/min (ref >60.00)
Glucose, Bld: 129 mg/dL — ABNORMAL HIGH (ref 70–99)
Potassium: 3.4 mEq/L — ABNORMAL LOW (ref 3.5–5.1)
Sodium: 138 mEq/L (ref 135–145)

## 2015-04-11 LAB — BRAIN NATRIURETIC PEPTIDE: Pro B Natriuretic peptide (BNP): 44 pg/mL (ref 0.0–100.0)

## 2015-04-11 NOTE — Patient Instructions (Signed)
We will be checking the following labs today - BMET and BNP   Medication Instructions:    Continue with your current medicines.     Testing/Procedures To Be Arranged:  Repeat the 24 hour urine as ordered by Dr. Everardo All  Follow-Up:   See Dr. Patty Sermons in 6 weeks  Restrict salt  Weigh daily    Other Special Instructions:   N/A  Call the [facility] office at [phone] if you have any questions, problems or concerns.

## 2015-04-11 NOTE — Progress Notes (Signed)
CARDIOLOGY OFFICE NOTE  Date:  04/11/2015    Catherine Gross Date of Birth: 04-01-1971 Medical Record #[id number]  PCP:  Lora Paula, MD  Cardiologist:  Patty Sermons    Chief Complaint  Patient presents with  . Congestive Heart Failure    Post hospital visit -seen for Dr. Patty Sermons     History of Present Illness: Catherine Gross is a 44 y.o. female who presents today for a post hospital visit. Seen for Dr. Patty Sermons. She has a past medical history of smoking and preeclampsia.  She presented with cough and congestion last month.  She was admitted for a CHF exacerbation. She was quite hypertensive.  Echo with EF of 35% with severe MR and mild to moderate atrial dilatation. Her evaluation revealed elevated metanephrine levels - to be worked up for pheochromocytoma with outpatient endocrine referral.   Comes back today. Here alone. Doing ok. Feels better. Not short of breath. No swelling. BP down nicely. Not really dizzy. Not smoking. Using the patches. She has seen Dr. Everardo All and he will be repeating her 24 hour urine - she has to wait til she gets her Medicaid - the test costs over 400 dollars and she is not able to afford. She has no real complaint today.   Past Medical History  Diagnosis Date  . Preeclampsia     first pregnancy, not with susequent pregnancy    Past Surgical History  Procedure Laterality Date  . Cesarean section       Medications: Current Outpatient Prescriptions  Medication Sig Dispense Refill  . carvedilol (COREG) 25 MG tablet Take 1 tablet (25 mg total) by mouth 2 (two) times daily with a meal. 60 tablet 0  . furosemide (LASIX) 20 MG tablet Take 1 tablet (20 mg total) by mouth daily. 30 tablet 0  . hydrALAZINE (APRESOLINE) 25 MG tablet Take 1 tablet (25 mg total) by mouth every 6 (six) hours. 120 tablet 0  . isosorbide mononitrate (IMDUR) 30 MG 24 hr tablet Take 0.5 tablets (15 mg total) by mouth daily. 60 tablet 0  . losartan (COZAAR) 100 MG tablet  Take 1 tablet (100 mg total) by mouth daily. 30 tablet 0  . nicotine (NICODERM CQ) 7 mg/24hr patch Place 1 patch (7 mg total) onto the skin daily. 28 patch 0  . potassium chloride SA (K-DUR,KLOR-CON) 20 MEQ tablet Take 2 tablets (40 mEq total) by mouth daily. 30 tablet 0   No current facility-administered medications for this visit.    Allergies: No Known Allergies  Social History: The patient  reports that she quit smoking about 2 weeks ago. She does not have any smokeless tobacco history on file. She reports that she does not drink alcohol or use illicit drugs.   Family History: The patient's family history includes Heart failure in her father; Hypertension in her father.   Review of Systems: Please see the history of present illness.  All other systems are reviewed and negative.   Physical Exam: VS:  BP 110/60 mmHg  Pulse 89  Ht 5\' 5"  (1.651 m)  Wt 178 lb 12.8 oz (81.103 kg)  BMI 29.75 kg/m2  SpO2 97%  LMP 03/26/2015 .  BMI Body mass index is 29.75 kg/(m^2).  Wt Readings from Last 3 Encounters:  04/11/15 178 lb 12.8 oz (81.103 kg)  04/09/15 177 lb (80.287 kg)  04/05/15 176 lb (79.833 kg)    General: Pleasant. Well developed, well nourished and in no acute distress.  HEENT: Normal. Neck:  Supple, no JVD, carotid bruits, or masses noted.  Cardiac: Regular rate and rhythm. No murmurs, rubs, or gallops. No edema.  Respiratory:  Lungs are clear to auscultation bilaterally with normal work of breathing.  GI: Soft and nontender.  MS: No deformity or atrophy. Gait and ROM intact. Skin: Warm and dry. Color is normal.  Neuro:  Strength and sensation are intact and no gross focal deficits noted.  Psych: Alert, appropriate and with normal affect.   LABORATORY DATA:  EKG:  EKG is not ordered today.  Lab Results  Component Value Date   WBC 5.6 04/02/2015   HGB 10.5* 04/02/2015   HCT 33.9* 04/02/2015   PLT 457* 04/02/2015   GLUCOSE 103* 04/02/2015   ALT 11 03/22/2015    AST 14 03/22/2015   NA 142 04/02/2015   K 4.8 04/02/2015   CL 107 04/02/2015   CREATININE 0.83 04/02/2015   BUN 16 04/02/2015   CO2 26 04/02/2015   TSH 1.201 03/22/2015   INR 1.03 03/22/2015    BNP (last 3 results)  Recent Labs  03/22/15 0040  BNP 373.9*    ProBNP (last 3 results) No results for input(s): PROBNP in the last 8760 hours.   Other Studies Reviewed Today: Echo Study Conclusions  - Left ventricle: The cavity size was mildly dilated. Wall thickness was increased in a pattern of mild LVH. Systolic function was moderately reduced. The estimated ejection fraction was in the range of 35%. - Aortic valve: There was mild regurgitation. - Mitral valve: There was severe regurgitation. - Left atrium: The atrium was mildly to moderately dilated. - Pulmonary arteries: PA peak pressure: 49 mm Hg (S).  Assessment/Plan: 1. Newly diagnosed systolic HF/cardiomyopathy - on beta blocker, ARB, nitrates, hydralazine - doing well clinically. No volume overload. Will need repeat echo 3 months after initial study on maximum medicines. Reviewed need for daily weights, salt restriction, etc. Recheck her lab today.   2. Elevated VMA on 24 hour urine - discussed with Dr. Patty Sermons - will get repeated per Dr. Everardo All - if those numbers are elevated - would send for CT of the abdomen to rule out pheochromocytoma.   3. HTN - BP looks great.  4. Severe MR - suspected to secondary to LV dilatation with no mention of intrinsic mitral valve disease such as MVP - repeat echo in 3 months - if still severe - may need to consider TEE and possible surgical referral.   5. Moderate pulmonary HTN - felt to be secondary to pulmonary venous congestion from LV dysfunction and MR  6. Iron deficiency anemia  7. Tobacco abuse - not smoking.   Current medicines are reviewed with the patient today.  The patient does not have concerns regarding medicines other than what has been noted above.  The  following changes have been made:  See above.  Labs/ tests ordered today include:    Orders Placed This Encounter  Procedures  . Basic metabolic panel  . Brain natriuretic peptide     Disposition:   FU with Dr. Patty Sermons in 6 weeks.   Patient is agreeable to this plan and will call if any problems develop in the interim.   Signed: Rosalio Macadamia, RN, ANP-C 04/11/2015 8:30 AM  [facility] [address] [place], [place]  [address] Phone: [phone] Fax: [phone]

## 2015-04-12 ENCOUNTER — Other Ambulatory Visit: Payer: Self-pay | Admitting: *Deleted

## 2015-04-12 MED ORDER — POTASSIUM CHLORIDE CRYS ER 20 MEQ PO TBCR: 60.0000 meq | EXTENDED_RELEASE_TABLET | Freq: Every day | ORAL | Status: DC

## 2015-05-02 ENCOUNTER — Ambulatory Visit: Payer: Self-pay | Attending: Family Medicine | Admitting: Family Medicine

## 2015-05-02 ENCOUNTER — Encounter: Payer: Self-pay | Admitting: Family Medicine

## 2015-05-02 VITALS — BP 128/77 | HR 83 | Temp 98.8°F | Resp 16 | Ht 65.0 in | Wt 181.0 lb

## 2015-05-02 DIAGNOSIS — N898 Other specified noninflammatory disorders of vagina: Secondary | ICD-10-CM

## 2015-05-02 DIAGNOSIS — E876 Hypokalemia: Secondary | ICD-10-CM

## 2015-05-02 DIAGNOSIS — Z23 Encounter for immunization: Secondary | ICD-10-CM

## 2015-05-02 DIAGNOSIS — Z87891 Personal history of nicotine dependence: Secondary | ICD-10-CM | POA: Insufficient documentation

## 2015-05-02 DIAGNOSIS — Z114 Encounter for screening for human immunodeficiency virus [HIV]: Secondary | ICD-10-CM

## 2015-05-02 DIAGNOSIS — I509 Heart failure, unspecified: Secondary | ICD-10-CM | POA: Insufficient documentation

## 2015-05-02 DIAGNOSIS — Z124 Encounter for screening for malignant neoplasm of cervix: Secondary | ICD-10-CM

## 2015-05-02 LAB — BASIC METABOLIC PANEL
BUN: 10 mg/dL (ref 6–23)
CHLORIDE: 106 meq/L (ref 96–112)
CO2: 24 meq/L (ref 19–32)
CREATININE: 0.75 mg/dL (ref 0.50–1.10)
Calcium: 9.5 mg/dL (ref 8.4–10.5)
Glucose, Bld: 100 mg/dL — ABNORMAL HIGH (ref 70–99)
Potassium: 5.3 mEq/L (ref 3.5–5.3)
SODIUM: 141 meq/L (ref 135–145)

## 2015-05-02 MED ORDER — FLUCONAZOLE 150 MG PO TABS: 150.0000 mg | ORAL_TABLET | Freq: Once | ORAL | Status: DC

## 2015-05-02 MED ORDER — METRONIDAZOLE 500 MG PO TABS: 500.0000 mg | ORAL_TABLET | Freq: Two times a day (BID) | ORAL | Status: DC

## 2015-05-02 MED ORDER — FUROSEMIDE 20 MG PO TABS: 20.0000 mg | ORAL_TABLET | Freq: Every day | ORAL | Status: DC

## 2015-05-02 MED ORDER — LOSARTAN POTASSIUM 100 MG PO TABS: 100.0000 mg | ORAL_TABLET | Freq: Every day | ORAL | Status: DC

## 2015-05-02 MED ORDER — CARVEDILOL 25 MG PO TABS: 25.0000 mg | ORAL_TABLET | Freq: Two times a day (BID) | ORAL | Status: DC

## 2015-05-02 MED ORDER — HYDRALAZINE HCL 25 MG PO TABS: 25.0000 mg | ORAL_TABLET | Freq: Four times a day (QID) | ORAL | Status: DC

## 2015-05-02 NOTE — Assessment & Plan Note (Signed)
Repeat potassium done today

## 2015-05-02 NOTE — Patient Instructions (Addendum)
Ms. Mclamb,  Thank you for coming in to see me today  1. CHF: stable meds refilled today Checking potassium   2. Pap done today, normal pelvic  3. Breast exam done today, normal  4. Vaginal discharge, BV suspected treating with a 7 day course of flagyl followed diflucan  You will be called with results. You are also encouraged to check mychart for results.  Keep cardiology follow up F/u in 3 months with me for HTN and CHF   Dr. Armen Pickup   Bacterial Vaginosis Bacterial vaginosis is a vaginal infection that occurs when the normal balance of bacteria in the vagina is disrupted. It results from an overgrowth of certain bacteria. This is the most common vaginal infection in women of childbearing age. Treatment is important to prevent complications, especially in pregnant women, as it can cause a premature delivery. CAUSES  Bacterial vaginosis is caused by an increase in harmful bacteria that are normally present in smaller amounts in the vagina. Several different kinds of bacteria can cause bacterial vaginosis. However, the reason that the condition develops is not fully understood. RISK FACTORS Certain activities or behaviors can put you at an increased risk of developing bacterial vaginosis, including:  Having a new sex partner or multiple sex partners.  Douching.  Using an intrauterine device (IUD) for contraception. Women do not get bacterial vaginosis from toilet seats, bedding, swimming pools, or contact with objects around them. SIGNS AND SYMPTOMS  Some women with bacterial vaginosis have no signs or symptoms. Common symptoms include:  Grey vaginal discharge.  A fishlike odor with discharge, especially after sexual intercourse.  Itching or burning of the vagina and vulva.  Burning or pain with urination. DIAGNOSIS  Your health care provider will take a medical history and examine the vagina for signs of bacterial vaginosis. A sample of vaginal fluid may be taken. Your  health care provider will look at this sample under a microscope to check for bacteria and abnormal cells. A vaginal pH test may also be done.  TREATMENT  Bacterial vaginosis may be treated with antibiotic medicines. These may be given in the form of a pill or a vaginal cream. A second round of antibiotics may be prescribed if the condition comes back after treatment.  HOME CARE INSTRUCTIONS   Only take over-the-counter or prescription medicines as directed by your health care provider.  If antibiotic medicine was prescribed, take it as directed. Make sure you finish it even if you start to feel better.  Do not have sex until treatment is completed.  Tell all sexual partners that you have a vaginal infection. They should see their health care provider and be treated if they have problems, such as a mild rash or itching.  Practice safe sex by using condoms and only having one sex partner. SEEK MEDICAL CARE IF:   Your symptoms are not improving after 3 days of treatment.  You have increased discharge or pain.  You have a fever. MAKE SURE YOU:   Understand these instructions.  Will watch your condition.  Will get help right away if you are not doing well or get worse. FOR MORE INFORMATION  Centers for Disease Control and Prevention, Division of STD Prevention: SolutionApps.co.za American Sexual Health Association (ASHA): www.ashastd.org  Document Released: 11/24/2005 Document Revised: 09/14/2013 Document Reviewed: 07/06/2013 [facility] Patient Information 2015 [place], [place]. This information is not intended to replace advice given to you by your health care provider. Make sure you discuss any questions you have with  your health care provider.

## 2015-05-02 NOTE — Assessment & Plan Note (Signed)
A: suspect BV P:  Wet prep sent off Treated with flagyl followed by diflucan

## 2015-05-02 NOTE — Progress Notes (Signed)
   Subjective:    Patient ID: Catherine Gross, female    DOB: 02-02-1971, 44 y.o.   MRN: [id number] CC: f/u CHF, pap smear and breast exam  HPI 44 yo F   1. CHF: had cardiology appt 3 weeks ago. Increased potassium dose due to low potassium. Doing well. Compliant with and tolerating medications, low salt diet and fluid restriction.   2. Pap smear: no hx of abnormal paps. No bleeding, discharge, pain, lesions.  3. Breast exam: no hx of breast pains or lesions. MGM with breast cancer at old age. Patient has not had a mammogram.    Soc Hx: former smoker, quit in 03/2015  Review of Systems  Constitutional: Negative for fever, diaphoresis and unexpected weight change.  Respiratory: Negative for cough and shortness of breath.   Cardiovascular: Negative for chest pain, palpitations and leg swelling.  Genitourinary: Negative for vaginal bleeding, vaginal discharge, vaginal pain, menstrual problem, pelvic pain and dyspareunia.      Objective:   Physical Exam BP 128/77 mmHg  Pulse 83  Temp(Src) 98.8 F (37.1 C) (Oral)  Resp 16  Ht 5\' 5"  (1.651 m)  Wt 181 lb (82.101 kg)  BMI 30.12 kg/m2  SpO2 97%  LMP 04/17/2015 General appearance: alert, cooperative and no distress Lungs: clear to auscultation bilaterally Breasts: normal appearance, no masses or tenderness, No nipple retraction or dimpling, No nipple discharge or bleeding, No axillary or supraclavicular adenopathy, Normal to palpation without dominant masses Heart: regular rate and rhythm, S1, S2 normal, no murmur, click, rub or gallop Abdomen: soft, non-tender; bowel sounds normal; no masses,  no organomegaly Pelvic: cervix normal in appearance, external genitalia normal, no adnexal masses or tenderness, no cervical motion tenderness, positive findings: vaginal discharge:  white, thin and malodorous, rectovaginal septum normal and uterus normal size, shape, and consistency Extremities: extremities normal, atraumatic, no cyanosis or  edema     Assessment & Plan:

## 2015-05-02 NOTE — Progress Notes (Signed)
Annual pap Last pap smear- within normal limits No vaginal discharge no itching

## 2015-05-02 NOTE — Assessment & Plan Note (Signed)
Pap done today  

## 2015-05-02 NOTE — Assessment & Plan Note (Signed)
Screening HIV ordered  

## 2015-05-03 LAB — HIV ANTIBODY (ROUTINE TESTING W REFLEX): HIV: NONREACTIVE

## 2015-05-03 LAB — CERVICOVAGINAL ANCILLARY ONLY
Chlamydia: NEGATIVE
Neisseria Gonorrhea: NEGATIVE
WET PREP (BD AFFIRM): POSITIVE — AB

## 2015-05-04 LAB — CYTOLOGY - PAP

## 2015-05-14 ENCOUNTER — Telehealth: Payer: Self-pay | Admitting: *Deleted

## 2015-05-14 NOTE — Telephone Encounter (Signed)
-----   Message from Doris Cheadle, MD sent at 05/03/2015 11:33 AM EDT ----- Call and let the  patient know that her test for bacterial vaginosis is positive, advise patient to take Flagyl 500 mg twice a day for 7 days.follow up with PCP

## 2015-05-14 NOTE — Telephone Encounter (Signed)
-----   Message from Doris Cheadle, MD sent at 05/04/2015 12:26 PM EDT ----- Call and let the patient know that her PAP result is normal

## 2015-05-14 NOTE — Telephone Encounter (Signed)
LVM to return call.

## 2015-05-15 NOTE — Telephone Encounter (Signed)
Called back to patient.  Gave normal pap results Informed pt of BV, treat with flagyl followed by diflucan.

## 2015-05-15 NOTE — Telephone Encounter (Signed)
Pt returning nurses call

## 2015-06-06 ENCOUNTER — Ambulatory Visit (INDEPENDENT_AMBULATORY_CARE_PROVIDER_SITE_OTHER): Payer: Self-pay | Admitting: Cardiology

## 2015-06-06 ENCOUNTER — Encounter: Payer: Self-pay | Admitting: Cardiology

## 2015-06-06 VITALS — BP 138/90 | HR 70 | Ht 65.0 in | Wt 184.0 lb

## 2015-06-06 DIAGNOSIS — R0600 Dyspnea, unspecified: Secondary | ICD-10-CM

## 2015-06-06 DIAGNOSIS — I420 Dilated cardiomyopathy: Secondary | ICD-10-CM

## 2015-06-06 DIAGNOSIS — D508 Other iron deficiency anemias: Secondary | ICD-10-CM

## 2015-06-06 DIAGNOSIS — I5022 Chronic systolic (congestive) heart failure: Secondary | ICD-10-CM

## 2015-06-06 LAB — CBC WITH DIFFERENTIAL/PLATELET
Basophils Absolute: 0 10*3/uL (ref 0.0–0.1)
Basophils Relative: 0.6 % (ref 0.0–3.0)
EOS ABS: 0.2 10*3/uL (ref 0.0–0.7)
EOS PCT: 3.1 % (ref 0.0–5.0)
HCT: 33.9 % — ABNORMAL LOW (ref 36.0–46.0)
HEMOGLOBIN: 10.8 g/dL — AB (ref 12.0–15.0)
LYMPHS PCT: 30.6 % (ref 12.0–46.0)
Lymphs Abs: 1.7 10*3/uL (ref 0.7–4.0)
MCHC: 31.9 g/dL (ref 30.0–36.0)
MCV: 80.7 fl (ref 78.0–100.0)
Monocytes Absolute: 0.5 10*3/uL (ref 0.1–1.0)
Monocytes Relative: 8.9 % (ref 3.0–12.0)
NEUTROS PCT: 56.8 % (ref 43.0–77.0)
Neutro Abs: 3.1 10*3/uL (ref 1.4–7.7)
Platelets: 279 10*3/uL (ref 150.0–400.0)
RBC: 4.2 Mil/uL (ref 3.87–5.11)
RDW: 18.3 % — ABNORMAL HIGH (ref 11.5–15.5)
WBC: 5.4 10*3/uL (ref 4.0–10.5)

## 2015-06-06 LAB — BASIC METABOLIC PANEL
BUN: 12 mg/dL (ref 6–23)
CALCIUM: 9.5 mg/dL (ref 8.4–10.5)
CHLORIDE: 107 meq/L (ref 96–112)
CO2: 29 mEq/L (ref 19–32)
CREATININE: 0.82 mg/dL (ref 0.40–1.20)
GFR: 97.95 mL/min (ref >60.00)
GLUCOSE: 78 mg/dL (ref 70–99)
Potassium: 4.1 mEq/L (ref 3.5–5.1)
SODIUM: 140 meq/L (ref 135–145)

## 2015-06-06 NOTE — Patient Instructions (Addendum)
Medication Instructions:  Your physician recommends that you continue on your current medications as directed. Please refer to the Current Medication list given to you today.  Labwork: CBC/BMET  Testing/Procedures: NONE  Follow-Up: Your physician recommends that you schedule a follow-up appointment in: 3 MONTH   WORK HARDER ON DIET AND WEIGHT LOSS

## 2015-06-06 NOTE — Progress Notes (Signed)
Quick Note:  Please report to patient. The recent labs are stable. Continue same medication and careful diet. ______ 

## 2015-06-06 NOTE — Progress Notes (Signed)
Cardiology Office Note   Date:  06/06/2015   ID:  Catherine Gross, DOB 04-Jun-1971, MRN [id number]  PCP:  Lora Paula, MD  Cardiologist: Cassell Clement MD  No chief complaint on file.     History of Present Illness: Catherine Gross is a 44 y.o. female who presents for scheduled follow-up office visit    She presented with cough and congestion last month. She was admitted for a CHF exacerbation. She was quite hypertensive. Echo with EF of 35% with severe MR and mild to moderate atrial dilatation. Her evaluation revealed elevated metanephrine levels - to be worked up for pheochromocytoma with outpatient endocrine referral.    She has seen Dr. Everardo All and he will be repeating her 24 hour urine - she has to wait til she gets her Medicaid - the test costs over 400 dollars and she is not able to afford. She has no real complaint today.   She feels well today.  She is not having any chest pain or shortness of breath.  She sleeps flat without orthopnea.  Her blood pressure at home is normal.  Her blood pressure here at the office today was slightly elevated but she did not take her morning pills yet because she was running late. She has stopped smoking cigarettes.  She attributes her weight gain to the fact that she is eating more now that she does not smoke.  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 She has not been having any headaches.  No dizziness.  No palpitations or tachycardia.  No peripheral edema.   Past Medical History  Diagnosis Date  . Preeclampsia     first pregnancy, not with susequent pregnancy    Past Surgical History  Procedure Laterality Date  . Cesarean section       Current Outpatient Prescriptions  Medication Sig Dispense Refill  . carvedilol (COREG) 25 MG tablet Take 1 tablet (25 mg total) by mouth 2 (two) times daily with a meal. 60 tablet 5  . furosemide (LASIX) 20 MG tablet Take 1 tablet (20 mg total) by mouth  daily. 30 tablet 5  . hydrALAZINE (APRESOLINE) 25 MG tablet Take 1 tablet (25 mg total) by mouth 4 (four) times daily. 120 tablet 5  . isosorbide mononitrate (IMDUR) 30 MG 24 hr tablet Take 0.5 tablets (15 mg total) by mouth daily. 60 tablet 0  . losartan (COZAAR) 100 MG tablet Take 1 tablet (100 mg total) by mouth daily. 30 tablet 5  . nicotine (NICODERM CQ) 7 mg/24hr patch Place 1 patch (7 mg total) onto the skin daily. 28 patch 0  . potassium chloride SA (K-DUR,KLOR-CON) 20 MEQ tablet Take 3 tablets (60 mEq total) by mouth daily. 90 tablet 3   No current facility-administered medications for this visit.    Allergies:   Review of patient's allergies indicates no known allergies.    Social History:  The patient  reports that she quit smoking about 2 months ago. She does not have any smokeless tobacco history on file. She reports that she does not drink alcohol or use illicit drugs.   Family History:  The patient's family history includes Heart failure in her father; Hypertension in her father.    ROS:  Please see the history of present illness.   Otherwise, review of systems are positive for none.   All other systems are reviewed and negative.    PHYSICAL EXAM: VS:  BP 138/90 mmHg  Pulse  70  Ht  (1.651 m)  Wt 184 lb (83.462 kg)  BMI 30.62 kg/m2 , BMI Body mass index is 30.62 kg/(m^2). GEN: Well nourished, well developed, in no acute distress HEENT: normal Neck: no JVD, carotid bruits, or masses Cardiac: RRR; no murmurs, rubs, or gallops,no edema  Respiratory:  clear to auscultation bilaterally, normal work of breathing GI: soft, nontender, nondistended, + BS MS: no deformity or atrophy Skin: warm and dry, no rash Neuro:  Strength and sensation are intact Psych: euthymic mood, full affect   EKG:  EKG is ordered today. The ekg ordered today demonstrates normal sinus rhythm.  Nonspecific T wave abnormality.  Since previous tracing of 03/21/15, heart rate is  slower   Recent Labs: 03/22/2015: ALT 11; B Natriuretic Peptide 373.9*; TSH 1.201 04/02/2015: Hemoglobin 10.5*; Platelets 457* 04/11/2015: Pro B Natriuretic peptide (BNP) 44.0 05/02/2015: BUN 10; Creat 0.75; Potassium 5.3; Sodium 141    Lipid Panel No results found for: CHOL, TRIG, HDL, CHOLHDL, VLDL, LDLCALC, LDLDIRECT    Wt Readings from Last 3 Encounters:  06/06/15 184 lb (83.462 kg)  05/02/15 181 lb (82.101 kg)  04/11/15 178 lb 12.8 oz (81.103 kg)        ASSESSMENT AND PLAN:  1. Newly diagnosed systolic HF/cardiomyopathy - on beta blocker, ARB, nitrates, hydralazine - doing well clinically. No volume overload.  She is not having any symptoms.  She is tolerating her medication.  2. Elevated VMA on 24 hour urine - - will get repeated per Dr. Everardo All - if those numbers are elevated - would send for CT of the abdomen to rule out pheochromocytoma.   3. HTN - BP looks great.  4. Severe MR - suspected to secondary to LV dilatation .  We will plan to repeat her echocardiogram probably after her next office visit.  We would like her to finish up on the workup of the possible pheochromocytoma first.  5. Moderate pulmonary HTN - felt to be secondary to pulmonary venous congestion from LV dysfunction and MR  6. Iron deficiency anemia.  We will repeat the CBC today  7. Tobacco abuse - not smoking.   Current medicines are reviewed at length with the patient today.  The patient does not have concerns regarding medicines.  The following changes have been made:  no change  Labs/ tests ordered today include:   Orders Placed This Encounter  Procedures  . Basic metabolic panel  . CBC with Differential/Platelet  . EKG 12-Lead    Continue current medication.  Recheck in 3 months for office visit.  Try to lose weight.  We are checking a CBC and a basal metabolic panel today.  Her last serum potassium was slightly high.  Consider repeat echocardiogram after next office visit.  She is  essentially having no symptoms now.  Karie Schwalbe MD 06/06/2015 8:55 AM    [facility] [address] [place], [place], [place]  [address] Phone: [phone]; Fax: [phone]

## 2015-07-13 ENCOUNTER — Ambulatory Visit (HOSPITAL_COMMUNITY)
Admission: RE | Admit: 2015-07-13 | Discharge: 2015-07-13 | Disposition: A | Discharge status: Home / Self Care | Payer: Medicaid Other | Source: Ambulatory Visit | Attending: Physician Assistant | Admitting: Physician Assistant

## 2015-07-13 ENCOUNTER — Other Ambulatory Visit: Payer: Self-pay | Admitting: Physician Assistant

## 2015-07-13 ENCOUNTER — Encounter: Payer: Self-pay | Admitting: Physician Assistant

## 2015-07-13 ENCOUNTER — Ambulatory Visit: Payer: Medicaid Other | Attending: Family Medicine | Admitting: Physician Assistant

## 2015-07-13 VITALS — BP 150/88 | HR 100 | Temp 99.1°F | Resp 16 | Ht 65.0 in | Wt 188.8 lb

## 2015-07-13 DIAGNOSIS — W19XXXA Unspecified fall, initial encounter: Secondary | ICD-10-CM | POA: Insufficient documentation

## 2015-07-13 DIAGNOSIS — Z79899 Other long term (current) drug therapy: Secondary | ICD-10-CM | POA: Insufficient documentation

## 2015-07-13 DIAGNOSIS — M25551 Pain in right hip: Secondary | ICD-10-CM | POA: Insufficient documentation

## 2015-07-13 DIAGNOSIS — M419 Scoliosis, unspecified: Secondary | ICD-10-CM | POA: Insufficient documentation

## 2015-07-13 MED ORDER — HYDROCODONE-ACETAMINOPHEN 10-325 MG PO TABS: 1.0000 | ORAL_TABLET | Freq: Four times a day (QID) | ORAL | Status: DC | PRN

## 2015-07-13 NOTE — Progress Notes (Signed)
Patient indicates she fell on 07/07/15 and has had right hip pain since that time. Patient rates pain 8/10. She indicates right hip is bruised and slightly swollen. Patient having difficulty walking. Patient taking ibuprofen for pain but indicates it is not helping pain.

## 2015-07-13 NOTE — Progress Notes (Signed)
Chief Complaint: S/p fall  Subjective: This is a 44 year old female who is status post a fall on this past Saturday. She was outside walking up steps and missed a step. She landed hard on the concrete. She landed on her right hip. She initially felt fine the next day but later started suffering from severe pain in the right hip. She actually feels better when she walks. Is very difficult to sit down correctly. She's had significant bruising. She has been able to go to work. She's been taking some over-the-counter ibuprofen with temporary relief. Occasionally she'll have some shooting pain down the right leg as well.   ROS:  GEN: denies fever or chills, denies change in weight Skin: denies lesions or rashes EXT: + muscle spasms or swelling; + pain in lower ext, no weakness NEURO: denies numbness or tingling, denies sz, stroke or TIA   Objective:  Filed Vitals:   07/13/15 1135  BP: 150/88  Pulse: 100  Temp: 99.1 F (37.3 C)  TempSrc: Oral  Resp: 16  Height: 5\' 5"  (1.651 m)  Weight: 188 lb 12.8 oz (85.639 kg)  SpO2: 98%    Physical Exam:  General: in no acute distress. Heart: Normal  s1 &s2  Regular rate and rhythm, 2/6 murmurs, rubs, gallops. Lungs: Clear to auscultation bilaterally. Extremities: No clubbing cyanosis or edema with positive pedal pulses; ecchymosis right hip area with decreased range of motion and tenderness to touch    Medications: Prior to Admission medications   Medication Sig Start Date End Date Taking? Authorizing Provider  carvedilol (COREG) 25 MG tablet Take 1 tablet (25 mg total) by mouth 2 (two) times daily with a meal. 05/02/15  Yes Josalyn Funches, MD  furosemide (LASIX) 20 MG tablet Take 1 tablet (20 mg total) by mouth daily. 05/02/15  Yes Josalyn Funches, MD  hydrALAZINE (APRESOLINE) 25 MG tablet Take 1 tablet (25 mg total) by mouth 4 (four) times daily. 05/02/15  Yes Josalyn Funches, MD  isosorbide mononitrate (IMDUR) 30 MG 24 hr tablet Take 0.5  tablets (15 mg total) by mouth daily. 03/30/15  Yes Calvert Cantor, MD  losartan (COZAAR) 100 MG tablet Take 1 tablet (100 mg total) by mouth daily. 05/02/15  Yes Josalyn Funches, MD  potassium chloride SA (K-DUR,KLOR-CON) 20 MEQ tablet Take 3 tablets (60 mEq total) by mouth daily. 04/12/15  Yes Rosalio Macadamia, NP  HYDROcodone-acetaminophen (NORCO) 10-325 MG per tablet Take 1-2 tablets by mouth every 6 (six) hours as needed for moderate pain. 07/13/15   Vivianne Master, PA-C  nicotine (NICODERM CQ) 7 mg/24hr patch Place 1 patch (7 mg total) onto the skin daily. Patient not taking: Reported on 07/13/2015 04/02/15   Jaclyn Shaggy, MD    Assessment: 1. S/p fall with injury to right hip  Plan: Prn pain meds Right hip xray Lower back xray Went over a few back/hip strengthening exercises  Follow up:2 weeks  The patient was given clear instructions to go to ER or return to medical center if symptoms don't improve, worsen or new problems develop. The patient verbalized understanding. The patient was told to call to get lab results if they haven't heard anything in the next week.   This note has been created with Education officer, environmental. Any transcriptional errors are unintentional.   Scot Jun, PA-C 07/13/2015, 12:05 PM

## 2015-07-25 ENCOUNTER — Telehealth: Payer: Self-pay

## 2015-07-25 NOTE — Telephone Encounter (Signed)
Nurse called patient, patient verified date of birth. Patient aware of no fracture in back and hip xrays and a little arthritis is noted. Patient agrees to continue with pain meds and exercises. Patient reports feeling "a lot better" and wants to know if she should still come in on Monday for follow up appointment with Dr. Armen Pickup. Nurse will send message to Dr. Armen Pickup to see if patient still needs to be seen.

## 2015-07-25 NOTE — Telephone Encounter (Signed)
Patient can push back appt to September, so 3-4 week from now

## 2015-07-25 NOTE — Telephone Encounter (Signed)
-----   Message from Vivianne Master, [place] sent at 07/13/2015  3:34 PM EDT ----- Please let her know her hip and back xrays are without fracture. There is a little arthritis present but no fracture. Continue pain meds and exercises. Call back if no better in 1 week.  Thanks and have a great weekend!

## 2015-07-30 ENCOUNTER — Ambulatory Visit: Payer: Medicaid Other | Admitting: Family Medicine

## 2015-08-29 ENCOUNTER — Telehealth: Payer: Self-pay

## 2015-08-29 NOTE — Telephone Encounter (Signed)
Nurse called patient. Patient reports her mother passed away this week.  Nurse asked patient if se needed anything. Patient reports nothing is needed at this time.  Nurse educated patient on need of follow up appointment with Dr. Armen Pickup.  Nurse transferred patient to front office staff to schedule follow up appointment for hip pain.

## 2015-09-05 ENCOUNTER — Encounter: Payer: Self-pay | Admitting: Family Medicine

## 2015-09-05 ENCOUNTER — Ambulatory Visit: Payer: Medicaid Other | Attending: Family Medicine | Admitting: Family Medicine

## 2015-09-05 VITALS — BP 160/101 | HR 98 | Temp 98.8°F | Resp 16 | Ht 65.0 in | Wt 186.0 lb

## 2015-09-05 DIAGNOSIS — I420 Dilated cardiomyopathy: Secondary | ICD-10-CM

## 2015-09-05 DIAGNOSIS — I10 Essential (primary) hypertension: Secondary | ICD-10-CM

## 2015-09-05 DIAGNOSIS — F4320 Adjustment disorder, unspecified: Secondary | ICD-10-CM | POA: Insufficient documentation

## 2015-09-05 DIAGNOSIS — Z87891 Personal history of nicotine dependence: Secondary | ICD-10-CM | POA: Insufficient documentation

## 2015-09-05 DIAGNOSIS — I5021 Acute systolic (congestive) heart failure: Secondary | ICD-10-CM | POA: Diagnosis not present

## 2015-09-05 DIAGNOSIS — D649 Anemia, unspecified: Secondary | ICD-10-CM | POA: Diagnosis not present

## 2015-09-05 DIAGNOSIS — I5020 Unspecified systolic (congestive) heart failure: Secondary | ICD-10-CM | POA: Insufficient documentation

## 2015-09-05 DIAGNOSIS — Z79899 Other long term (current) drug therapy: Secondary | ICD-10-CM | POA: Insufficient documentation

## 2015-09-05 DIAGNOSIS — F4321 Adjustment disorder with depressed mood: Secondary | ICD-10-CM

## 2015-09-05 LAB — CBC
HCT: 36 % (ref 36.0–46.0)
Hemoglobin: 11.6 g/dL — ABNORMAL LOW (ref 12.0–15.0)
MCH: 26.1 pg (ref 26.0–34.0)
MCHC: 32.2 g/dL (ref 30.0–36.0)
MCV: 81.1 fL (ref 78.0–100.0)
MPV: 9.8 fL (ref 8.6–12.4)
Platelets: 324 10*3/uL (ref 150–400)
RBC: 4.44 MIL/uL (ref 3.87–5.11)
RDW: 17.3 % — ABNORMAL HIGH (ref 11.5–15.5)
WBC: 6.1 10*3/uL (ref 4.0–10.5)

## 2015-09-05 LAB — BASIC METABOLIC PANEL
BUN: 7 mg/dL (ref 7–25)
CALCIUM: 9.8 mg/dL (ref 8.6–10.2)
CO2: 29 mmol/L (ref 20–31)
CREATININE: 0.69 mg/dL (ref 0.50–1.10)
Chloride: 106 mmol/L (ref 98–110)
Glucose, Bld: 84 mg/dL (ref 65–99)
Potassium: 5 mmol/L (ref 3.5–5.3)
Sodium: 140 mmol/L (ref 135–146)

## 2015-09-05 MED ORDER — CARVEDILOL 25 MG PO TABS
25.0000 mg | ORAL_TABLET | Freq: Two times a day (BID) | ORAL | Status: DC
Start: 2015-09-05 — End: 2017-01-14

## 2015-09-05 MED ORDER — ISOSORBIDE MONONITRATE ER 30 MG PO TB24: 15.0000 mg | ORAL_TABLET | Freq: Every day | ORAL | Status: DC

## 2015-09-05 MED ORDER — HYDRALAZINE HCL 25 MG PO TABS: 25.0000 mg | ORAL_TABLET | Freq: Three times a day (TID) | ORAL | Status: DC

## 2015-09-05 MED ORDER — LORAZEPAM 0.5 MG PO TABS: 0.5000 mg | ORAL_TABLET | Freq: Every evening | ORAL | Status: DC | PRN

## 2015-09-05 MED ORDER — POTASSIUM CHLORIDE CRYS ER 20 MEQ PO TBCR: 40.0000 meq | EXTENDED_RELEASE_TABLET | Freq: Every day | ORAL | Status: DC

## 2015-09-05 MED ORDER — FUROSEMIDE 20 MG PO TABS: 20.0000 mg | ORAL_TABLET | Freq: Every day | ORAL | Status: DC

## 2015-09-05 NOTE — Progress Notes (Signed)
Subjective:  Patient ID: Catherine Gross, female    DOB: 1971/11/24  Age: 44 y.o. MRN: [id number]  CC: Hypertension  HPI Catherine Gross presents for    1. HTN: no medications x 2 days. Taking hydralazine TID instead of QID. No HA, CP, SOB.   2. Trouble sleeping: x 4 days. Her mother passed away 4 days ago. Her mother had Alzheimer's. Patient endorses good support from family and friends. She endorses trouble sleeping and decreased appetite.    Social History  Substance Use Topics  . Smoking status: Former Smoker -- 0.50 packs/day for 20 years    Quit date: 03/22/2015  . Smokeless tobacco: Not on file  . Alcohol Use: No    Outpatient Prescriptions Prior to Visit  Medication Sig Dispense Refill  . carvedilol (COREG) 25 MG tablet Take 1 tablet (25 mg total) by mouth 2 (two) times daily with a meal. 60 tablet 5  . furosemide (LASIX) 20 MG tablet Take 1 tablet (20 mg total) by mouth daily. 30 tablet 5  . hydrALAZINE (APRESOLINE) 25 MG tablet Take 1 tablet (25 mg total) by mouth 4 (four) times daily. 120 tablet 5  . HYDROcodone-acetaminophen (NORCO) 10-325 MG per tablet Take 1-2 tablets by mouth every 6 (six) hours as needed for moderate pain. 30 tablet 0  . isosorbide mononitrate (IMDUR) 30 MG 24 hr tablet Take 0.5 tablets (15 mg total) by mouth daily. 60 tablet 0  . losartan (COZAAR) 100 MG tablet Take 1 tablet (100 mg total) by mouth daily. 30 tablet 5  . nicotine (NICODERM CQ) 7 mg/24hr patch Place 1 patch (7 mg total) onto the skin daily. (Patient not taking: Reported on 07/13/2015) 28 patch 0  . potassium chloride SA (K-DUR,KLOR-CON) 20 MEQ tablet Take 3 tablets (60 mEq total) by mouth daily. 90 tablet 3   No facility-administered medications prior to visit.    ROS Review of Systems  Constitutional: Positive for appetite change. Negative for fever and chills.  Eyes: Negative for visual disturbance.  Respiratory: Negative for shortness of breath.   Cardiovascular: Negative for  chest pain.  Gastrointestinal: Negative for abdominal pain and blood in stool.  Musculoskeletal: Negative for back pain and arthralgias.  Skin: Negative for rash.  Allergic/Immunologic: Negative for immunocompromised state.  Hematological: Negative for adenopathy. Does not bruise/bleed easily.  Psychiatric/Behavioral: Positive for sleep disturbance. Negative for suicidal ideas and dysphoric mood.  GAD-7: score of 4. 1-2-5. All others 0.   Objective:  BP 160/101 mmHg  Pulse 98  Temp(Src) 98.8 F (37.1 C) (Oral)  Resp 16  Ht  (1.651 m)  Wt 186 lb (84.369 kg)  BMI 30.95 kg/m2  SpO2 99%  LMP 08/09/2015  BP/Weight 09/05/2015 07/13/2015 06/06/2015  Systolic BP 160 150 138  Diastolic BP 101 88 90  Wt. (Lbs) 186 188.8 184  BMI 30.95 31.42 30.62   Physical Exam  Constitutional: She is oriented to person, place, and time. She appears well-developed and well-nourished. No distress.  HENT:  Head: Normocephalic and atraumatic.  Cardiovascular: Normal rate, regular rhythm, normal heart sounds and intact distal pulses.   Pulmonary/Chest: Effort normal and breath sounds normal.  Musculoskeletal: She exhibits no edema.  Neurological: She is alert and oriented to person, place, and time.  Skin: Skin is warm and dry. No rash noted.  Psychiatric: She has a normal mood and affect.     Assessment & Plan:   Problem List Items Addressed This Visit    Accelerated hypertension - Primary  Relevant Medications   hydrALAZINE (APRESOLINE) 25 MG tablet   furosemide (LASIX) 20 MG tablet   carvedilol (COREG) 25 MG tablet   isosorbide mononitrate (IMDUR) 30 MG 24 hr tablet   potassium chloride SA (K-DUR,KLOR-CON) 20 MEQ tablet   Other Relevant Orders   Basic Metabolic Panel   Dilated cardiomyopathy   Relevant Medications   hydrALAZINE (APRESOLINE) 25 MG tablet   furosemide (LASIX) 20 MG tablet   carvedilol (COREG) 25 MG tablet   isosorbide mononitrate (IMDUR) 30 MG 24 hr tablet   potassium  chloride SA (K-DUR,KLOR-CON) 20 MEQ tablet   Other Relevant Orders   Basic Metabolic Panel   Grief reaction   Relevant Medications   LORazepam (ATIVAN) 0.5 MG tablet   Normocytic anemia   Relevant Orders   CBC   Systolic heart failure   Relevant Medications   hydrALAZINE (APRESOLINE) 25 MG tablet   furosemide (LASIX) 20 MG tablet   carvedilol (COREG) 25 MG tablet   isosorbide mononitrate (IMDUR) 30 MG 24 hr tablet   potassium chloride SA (K-DUR,KLOR-CON) 20 MEQ tablet   Other Relevant Orders   Basic Metabolic Panel      No orders of the defined types were placed in this encounter.    Follow-up: No Follow-up on file.   Dessa Phi MD

## 2015-09-05 NOTE — Patient Instructions (Signed)
Catherine Gross,  I am sorry for your loss  Ativan ordered to help with sleep for the next few weeks.   Refilled all BP medications.   F/u in 3 weeks with RN for BP check F/u with me in 6 weeks  Dr. Fannie Knee was seen today for hypertension.  Diagnoses and all orders for this visit:  Accelerated hypertension -     hydrALAZINE (APRESOLINE) 25 MG tablet; Take 1 tablet (25 mg total) by mouth 3 (three) times daily. -     furosemide (LASIX) 20 MG tablet; Take 1 tablet (20 mg total) by mouth daily. -     carvedilol (COREG) 25 MG tablet; Take 1 tablet (25 mg total) by mouth 2 (two) times daily with a meal. -     isosorbide mononitrate (IMDUR) 30 MG 24 hr tablet; Take 0.5 tablets (15 mg total) by mouth daily. -     Basic Metabolic Panel -     potassium chloride SA (K-DUR,KLOR-CON) 20 MEQ tablet; Take 2 tablets (40 mEq total) by mouth daily.  Dilated cardiomyopathy -     hydrALAZINE (APRESOLINE) 25 MG tablet; Take 1 tablet (25 mg total) by mouth 3 (three) times daily. -     furosemide (LASIX) 20 MG tablet; Take 1 tablet (20 mg total) by mouth daily. -     carvedilol (COREG) 25 MG tablet; Take 1 tablet (25 mg total) by mouth 2 (two) times daily with a meal. -     isosorbide mononitrate (IMDUR) 30 MG 24 hr tablet; Take 0.5 tablets (15 mg total) by mouth daily. -     Basic Metabolic Panel -     potassium chloride SA (K-DUR,KLOR-CON) 20 MEQ tablet; Take 2 tablets (40 mEq total) by mouth daily.  Acute systolic heart failure -     hydrALAZINE (APRESOLINE) 25 MG tablet; Take 1 tablet (25 mg total) by mouth 3 (three) times daily. -     furosemide (LASIX) 20 MG tablet; Take 1 tablet (20 mg total) by mouth daily. -     carvedilol (COREG) 25 MG tablet; Take 1 tablet (25 mg total) by mouth 2 (two) times daily with a meal. -     isosorbide mononitrate (IMDUR) 30 MG 24 hr tablet; Take 0.5 tablets (15 mg total) by mouth daily. -     Basic Metabolic Panel -     potassium chloride SA (K-DUR,KLOR-CON)  20 MEQ tablet; Take 2 tablets (40 mEq total) by mouth daily.  Normocytic anemia -     CBC  Grief reaction -     LORazepam (ATIVAN) 0.5 MG tablet; Take 1-2 tablets (0.5-1 mg total) by mouth at bedtime as needed for sleep.

## 2015-09-05 NOTE — Progress Notes (Signed)
F/U HTN no medication x 2 day  Stated unable to sleep. Hx HX tobacco

## 2015-09-06 ENCOUNTER — Telehealth: Payer: Self-pay | Admitting: Family Medicine

## 2015-09-06 MED ORDER — LORAZEPAM 0.5 MG PO TABS: 0.5000 mg | ORAL_TABLET | Freq: Every evening | ORAL | Status: DC | PRN

## 2015-09-06 NOTE — Telephone Encounter (Signed)
Pharmacy called stating that they received a prescription for Lorazepam and the top portion of the prescription was cut off, patient stated that there wasn't another prescription on the page. Pharmacy needs to verify information. Please f/u

## 2015-09-06 NOTE — Telephone Encounter (Signed)
[facility] refaxed Rx for Lorazepam 0.5mg  to the [facility] on file.

## 2015-09-06 NOTE — Addendum Note (Signed)
Addended by: Dessa Phi on: 09/06/2015 02:34 PM   Modules accepted: Orders, SmartSet

## 2015-09-07 ENCOUNTER — Ambulatory Visit: Payer: Self-pay | Admitting: Cardiology

## 2015-09-07 ENCOUNTER — Telehealth: Payer: Self-pay | Admitting: Family Medicine

## 2015-09-07 NOTE — Progress Notes (Unsigned)
Pt's rx for Lorazepam was faxed over to preferred Pharmacy on file [facility]) on 09/06/15.

## 2015-09-07 NOTE — Progress Notes (Deleted)
Subjective:     Patient ID: Catherine Gross, female   DOB: [birth year], 44 y.o.   MRN: [id number]  HPI   Review of Systems     Objective:   Physical Exam     Assessment:     ***    Plan:     ***

## 2015-09-07 NOTE — Telephone Encounter (Signed)
Received a failed fax report following attempt to fax ativan Rx yesterday. Ativan phoned into [facility] Left VM.

## 2015-09-12 ENCOUNTER — Encounter: Payer: Self-pay | Admitting: Cardiology

## 2015-09-12 ENCOUNTER — Telehealth: Payer: Self-pay | Admitting: Cardiology

## 2015-09-12 NOTE — Telephone Encounter (Signed)
Error//Seding No show letter// Wrong note

## 2015-09-14 ENCOUNTER — Telehealth: Payer: Self-pay | Admitting: *Deleted

## 2015-09-14 NOTE — Telephone Encounter (Signed)
-----   Message from Dessa Phi, MD sent at 09/06/2015  9:17 AM EDT ----- Normal BMP and improved Hgb on CBC

## 2015-09-14 NOTE — Telephone Encounter (Signed)
Patient returned phone call. Please f/u with pt. °

## 2015-09-14 NOTE — Telephone Encounter (Signed)
Date of birth verified by pt  Lab results given to pt- Normal BMP improved Hgb Pt verbalized understanding

## 2015-09-26 ENCOUNTER — Ambulatory Visit: Payer: Self-pay | Attending: Family Medicine | Admitting: Pharmacist

## 2015-09-26 VITALS — BP 125/81 | HR 101

## 2015-09-26 DIAGNOSIS — I10 Essential (primary) hypertension: Secondary | ICD-10-CM | POA: Insufficient documentation

## 2015-09-26 DIAGNOSIS — Z79899 Other long term (current) drug therapy: Secondary | ICD-10-CM | POA: Insufficient documentation

## 2015-09-26 NOTE — Progress Notes (Signed)
S:    Patient arrives in good spirits.    Presents to the clinic for hypertension evaluation.   Patient reports adherence with medications. She has taken all of her medications today but did not take carvedilol this morning as she is out. She is going to the pharmacy today to get a refill.   Current BP/cardiac Medications include:  Losartan 100 mg daily, carvedilol 25 mg BID, furosemide 20 mg daily, hydralazine 25 mg TID, isosorbide mononitrate 15 mg daily.   O:   Last 3 Office BP readings: BP Readings from Last 3 Encounters:  09/26/15 125/81  09/05/15 160/101  07/13/15 150/88    BMET    Component Value Date/Time   NA 140 09/05/2015 1211   K 5.0 09/05/2015 1211   CL 106 09/05/2015 1211   CO2 29 09/05/2015 1211   GLUCOSE 84 09/05/2015 1211   BUN 7 09/05/2015 1211   CREATININE 0.69 09/05/2015 1211   CREATININE 0.82 06/06/2015 0857   CALCIUM 9.8 09/05/2015 1211   GFRNONAA 87* 03/30/2015 0357   GFRAA >90 03/30/2015 0357    A/P: History of hypertension currently controlled on current medications.  No recommendations for any changes to medications. Patient will pick up carvedilol today and resume - this is why her pulse was slightly elevated.   Results reviewed and written information provided. Total time in face-to-face counseling 10 minutes.   F/U Clinic Visit with Dr. Armen Pickup as directed.

## 2015-09-26 NOTE — Patient Instructions (Signed)
Your blood pressure looks great!  Keep taking all of your medications.  Follow up with Dr. Armen Pickup as directed.

## 2016-01-31 MED FILL — ?CARVEDILOL 25 MG TABLET: 25 | 30 days supply | Qty: 60 | Fill #0

## 2016-01-31 MED FILL — FUROSEMIDE 20 MG TABLET: 20 | 30 days supply | Qty: 30 | Fill #0

## 2016-01-31 MED FILL — ISOSORBIDE MN ER 30 MG TAB: 30 | 30 days supply | Qty: 30 | Fill #1

## 2016-01-31 MED FILL — LOSARTAN POTASSIUM 100 MG T: 100 | 30 days supply | Qty: 30 | Fill #3

## 2016-01-31 MED FILL — POTASSIUM CL ER 20 MEQ TAB: 20 | 30 days supply | Qty: 60 | Fill #0

## 2017-01-13 ENCOUNTER — Telehealth: Payer: Self-pay | Admitting: Family Medicine

## 2017-01-13 NOTE — Telephone Encounter (Signed)
Patient called the office to request medication refill on her bp and heart medication. Pt is aware that she needs to come in the office to see PCP, but there aren't any appts available at the moment with PCP.   Thank you.

## 2017-01-13 NOTE — Telephone Encounter (Signed)
I called and left her a message to inform her that prescriptions will not be filled until she sees a provider in our office.

## 2017-01-13 NOTE — Telephone Encounter (Signed)
Patient's can always see a different provider. Walk in, same day etc. Whoever is available

## 2017-01-14 ENCOUNTER — Telehealth: Payer: Self-pay

## 2017-01-14 ENCOUNTER — Encounter: Payer: Self-pay | Admitting: Physician Assistant

## 2017-01-14 ENCOUNTER — Ambulatory Visit: Payer: 59 | Attending: Physician Assistant | Admitting: Physician Assistant

## 2017-01-14 VITALS — BP 158/113 | HR 100 | Temp 98.8°F | Ht 65.0 in | Wt 176.6 lb

## 2017-01-14 DIAGNOSIS — Z760 Encounter for issue of repeat prescription: Secondary | ICD-10-CM | POA: Insufficient documentation

## 2017-01-14 DIAGNOSIS — I5022 Chronic systolic (congestive) heart failure: Secondary | ICD-10-CM | POA: Diagnosis present

## 2017-01-14 MED ORDER — ISOSORBIDE MONONITRATE ER 30 MG PO TB24: 30.0000 mg | ORAL_TABLET | Freq: Every day | ORAL | 1 refills | Status: DC

## 2017-01-14 MED ORDER — FUROSEMIDE 20 MG PO TABS: 20.0000 mg | ORAL_TABLET | Freq: Every day | ORAL | 1 refills | Status: DC

## 2017-01-14 MED ORDER — CARVEDILOL 25 MG PO TABS: 25.0000 mg | ORAL_TABLET | Freq: Two times a day (BID) | ORAL | 1 refills | Status: DC

## 2017-01-14 MED ORDER — POTASSIUM CHLORIDE CRYS ER 20 MEQ PO TBCR: 40.0000 meq | EXTENDED_RELEASE_TABLET | Freq: Every day | ORAL | 1 refills | Status: DC

## 2017-01-14 MED ORDER — LOSARTAN POTASSIUM 100 MG PO TABS: 100.0000 mg | ORAL_TABLET | Freq: Every day | ORAL | 1 refills | Status: DC

## 2017-01-14 MED ORDER — HYDRALAZINE HCL 25 MG PO TABS: 25.0000 mg | ORAL_TABLET | Freq: Four times a day (QID) | ORAL | 1 refills | Status: DC

## 2017-01-14 NOTE — Patient Instructions (Addendum)
I have refilled all medications. It is important that you follow up in one week with Dr. Armen Pickup. I have changed your Hydralazine from one tablet three times a day to one tablet four times a day as this is the recommended dose for CHF.  Heart Failure Heart failure is a condition in which the heart has trouble pumping blood because it has become weak or stiff. This means that the heart does not pump blood efficiently for the body to work well. For some people with heart failure, fluid may back up into the lungs and there may be swelling (edema) in the lower legs. Heart failure is usually a long-term (chronic) condition. It is important for you to take good care of yourself and follow the treatment plan from your health care provider. What are the causes? This condition is caused by some health problems, including:  High blood pressure (hypertension). Hypertension causes the heart muscle to work harder than normal. High blood pressure eventually causes the heart to become stiff and weak.  Coronary artery disease (CAD). CAD is the buildup of cholesterol and fat (plaques) in the arteries of the heart.  Heart attack (myocardial infarction). Injured tissue, which is caused by the heart attack, does not contract as well and the heart's ability to pump blood is weakened.  Abnormal heart valves. When the heart valves do not open and close properly, the heart muscle must pump harder to keep the blood flowing.  Heart muscle disease (cardiomyopathy or myocarditis). Heart muscle disease is damage to the heart muscle from a variety of causes, such as drug or alcohol abuse, infections, or unknown causes. These can increase the risk of heart failure.  Lung disease. When the lungs do not work properly, the heart must work harder. What increases the risk? Risk of heart failure increases as a person ages. This condition is also more likely to develop in people who:  Are overweight.  Are female.  Smoke or chew  tobacco.  Abuse alcohol or illegal drugs.  Have taken medicines that can damage the heart, such as chemotherapy drugs.  Have diabetes.  High blood sugar (glucose) is associated with high fat (lipid) levels in the blood.  Diabetes can also damage tiny blood vessels that carry nutrients to the heart muscle.  Have abnormal heart rhythms.  Have thyroid problems.  Have low blood counts (anemia). What are the signs or symptoms? Symptoms of this condition include:  Shortness of breath with activity, such as when climbing stairs.  Persistent cough.  Swelling of the feet, ankles, legs, or abdomen.  Unexplained weight gain.  Difficulty breathing when lying flat (orthopnea).  Waking from sleep because of the need to sit up and get more air.  Rapid heartbeat.  Fatigue and loss of energy.  Feeling light-headed, dizzy, or close to fainting.  Loss of appetite.  Nausea.  Increased urination during the night (nocturia).  Confusion. How is this diagnosed? This condition is diagnosed based on:  Medical history, symptoms, and a physical exam.  Diagnostic tests, which may include:  Echocardiogram.  Electrocardiogram (ECG).  Chest X-ray.  Blood tests.  Exercise stress test.  Radionuclide scans.  Cardiac catheterization and angiogram. How is this treated? Treatment for this condition is aimed at managing the symptoms of heart failure. Medicines, behavioral changes, or other treatments may be necessary to treat heart failure. Medicines  These may include:  Angiotensin-converting enzyme (ACE) inhibitors. This type of medicine blocks the effects of a blood protein called angiotensin-converting enzyme. ACE  inhibitors relax (dilate) the blood vessels and help to lower blood pressure.  Angiotensin receptor blockers (ARBs). This type of medicine blocks the actions of a blood protein called angiotensin. ARBs dilate the blood vessels and help to lower blood pressure.  Water  pills (diuretics). Diuretics cause the kidneys to remove salt and water from the blood. The extra fluid is removed through urination, leaving a lower volume of blood that the heart has to pump.  Beta blockers. These improve heart muscle strength and they prevent the heart from beating too quickly.  Digoxin. This increases the force of the heartbeat. Healthy behavior changes  These may include:  Reaching and maintaining a healthy weight.  Stopping smoking or chewing tobacco.  Eating heart-healthy foods.  Limiting or avoiding alcohol.  Stopping use of street drugs (illegal drugs).  Physical activity. Other treatments  These may include:  Surgery to open blocked coronary arteries or repair damaged heart valves.  Placement of a biventricular pacemaker to improve heart muscle function (cardiac resynchronization therapy). This device paces both the right ventricle and left ventricle.  Placement of a device to treat serious abnormal heart rhythms (implantable cardioverter defibrillator, or ICD).  Placement of a device to improve the pumping ability of the heart (left ventricular assist device, or LVAD).  Heart transplant. This can cure heart failure, and it is considered for certain patients who do not improve with other therapies. Follow these instructions at home: Medicines  Take over-the-counter and prescription medicines only as told by your health care provider. Medicines are important in reducing the workload of your heart, slowing the progression of heart failure, and improving your symptoms.  Do not stop taking your medicine unless your health care provider told you to do that.  Do not skip any dose of medicine.  Refill your prescriptions before you run out of medicine. You need your medicines every day. Eating and drinking  Eat heart-healthy foods. Talk with a dietitian to make an eating plan that is right for you.  Choose foods that contain no trans fat and are low in  saturated fat and cholesterol. Healthy choices include fresh or frozen fruits and vegetables, fish, lean meats, legumes, fat-free or low-fat dairy products, and whole-grain or high-fiber foods.  Limit salt (sodium) if directed by your health care provider. Sodium restriction may reduce symptoms of heart failure. Ask a dietitian to recommend heart-healthy seasonings.  Use healthy cooking methods instead of frying. Healthy methods include roasting, grilling, broiling, baking, poaching, steaming, and stir-frying.  Limit your fluid intake if directed by your health care provider. Fluid restriction may reduce symptoms of heart failure. Lifestyle  Stop smoking or using chewing tobacco. Nicotine and tobacco can damage your heart and your blood vessels. Do not use nicotine gum or patches before talking to your health care provider.  Limit alcohol intake to no more than 1 drink per day for non-pregnant women and 2 drinks per day for men. One drink equals 12 oz of beer, 5 oz of wine, or 1 oz of hard liquor.  Drinking more than that is harmful to your heart. Tell your health care provider if you drink alcohol several times a week.  Talk with your health care provider about whether any level of alcohol use is safe for you.  If your heart has already been damaged by alcohol or you have severe heart failure, drinking alcohol should be stopped completely.  Stop use of illegal drugs.  Lose weight if directed by your health care provider.  Weight loss may reduce symptoms of heart failure.  Do moderate physical activity if directed by your health care provider. People who are elderly and people with severe heart failure should consult with a health care provider for physical activity recommendations. Monitor important information  Weigh yourself every day. Keeping track of your weight daily helps you to notice excess fluid sooner.  Weigh yourself every morning after you urinate and before you eat  breakfast.  Wear the same amount of clothing each time you weigh yourself.  Record your daily weight. Provide your health care provider with your weight record.  Monitor and record your blood pressure as told by your health care provider.  Check your pulse as told by your health care provider. Dealing with extreme temperatures  If the weather is extremely hot:  Avoid vigorous physical activity.  Use air conditioning or fans or seek a cooler location.  Avoid caffeine and alcohol.  Wear loose-fitting, lightweight, and light-colored clothing.  If the weather is extremely cold:  Avoid vigorous physical activity.  Layer your clothes.  Wear mittens or gloves, a hat, and a scarf when you go outside.  Avoid alcohol. General instructions  Manage other health conditions such as hypertension, diabetes, thyroid disease, or abnormal heart rhythms as told by your health care provider.  Learn to manage stress. If you need help to do this, ask your health care provider.  Plan rest periods when fatigued.  Get ongoing education and support as needed.  Participate in or seek rehabilitation as needed to maintain or improve independence and quality of life.  Stay up to date with immunizations. Keeping current on pneumococcal and influenza immunizations is especially important to prevent respiratory infections.  Keep all follow-up visits as told by your health care provider. This is important. Contact a health care provider if:  You have a rapid weight gain.  You have increasing shortness of breath that is unusual for you.  You are unable to participate in your usual physical activities.  You tire easily.  You cough more than normal, especially with physical activity.  You have any swelling or more swelling in areas such as your hands, feet, ankles, or abdomen.  You are unable to sleep because it is hard to breathe.  You feel like your heart is beating quickly  (palpitations).  You become dizzy or light-headed when you stand up. Get help right away if:  You have difficulty breathing.  You notice or your family notices a change in your awareness, such as having trouble staying awake or having difficulty with concentration.  You have pain or discomfort in your chest.  You have an episode of fainting (syncope). This information is not intended to replace advice given to you by your health care provider. Make sure you discuss any questions you have with your health care provider. Document Released: 11/24/2005 Document Revised: 07/29/2016 Document Reviewed: 06/18/2016 Elsevier Interactive Patient Education  2017 [employer].

## 2017-01-14 NOTE — Progress Notes (Signed)
Subjective:  Patient ID: Catherine Gross, female    DOB: December 04, 1971  Age: 46 y.o. MRN: [id number]  CC: CHF  HPI Catherine Gross is a 46 y.o. female with a PMH of low output systolic heart failure presents for medication refill for her CHF. She has only been taking Carvedilol. She is out of furosemide, hydralazine, Imdur, and losartan. Complains of orthopnea and decreased exercise tolerance. Coughs when supine and cough induces nausea and vomiting. Denies chest pain, generalized SOB, headache, AMS, LE edema, abdominal pain, fever, chills, rash, or GI/GU sxs.    Outpatient Medications Prior to Visit  Medication Sig Dispense Refill  . LORazepam (ATIVAN) 0.5 MG tablet Take 1-2 tablets (0.5-1 mg total) by mouth at bedtime as needed for sleep. 30 tablet 0  . carvedilol (COREG) 25 MG tablet Take 1 tablet (25 mg total) by mouth 2 (two) times daily with a meal. 60 tablet 5  . furosemide (LASIX) 20 MG tablet Take 1 tablet (20 mg total) by mouth daily. 30 tablet 5  . hydrALAZINE (APRESOLINE) 25 MG tablet Take 1 tablet (25 mg total) by mouth 3 (three) times daily. 90 tablet 5  . isosorbide mononitrate (IMDUR) 30 MG 24 hr tablet Take 0.5 tablets (15 mg total) by mouth daily. 60 tablet 1  . losartan (COZAAR) 100 MG tablet Take 1 tablet (100 mg total) by mouth daily. 30 tablet 5  . potassium chloride SA (K-DUR,KLOR-CON) 20 MEQ tablet Take 2 tablets (40 mEq total) by mouth daily. 60 tablet 5   No facility-administered medications prior to visit.      ROS Review of Systems  Constitutional: Negative for chills, fever and malaise/fatigue.  Eyes: Negative for blurred vision.  Respiratory: Positive for cough. Negative for shortness of breath.   Cardiovascular: Positive for orthopnea. Negative for chest pain, palpitations, claudication, leg swelling and PND.  Gastrointestinal: Positive for nausea and vomiting. Negative for abdominal pain.  Genitourinary: Negative for dysuria and hematuria.  Musculoskeletal:  Negative for joint pain and myalgias.  Skin: Negative for rash.  Neurological: Positive for tingling (few episodes of tingling on right thumb last week). Negative for headaches.  Psychiatric/Behavioral: Negative for depression. The patient is not nervous/anxious.     Objective:  BP (!) 158/113   Pulse (!) 125   Temp 98.8 F (37.1 C)   Ht 5\' 5"  (1.651 m)   Wt 176 lb 9.6 oz (80.1 kg)   SpO2 99%   BMI 29.39 kg/m   BP/Weight 01/14/2017 09/26/2015 09/05/2015  Systolic BP 158 125 160  Diastolic BP 113 81 101  Wt. (Lbs) 176.6 - 186  BMI 29.39 - 30.95      Physical Exam  Constitutional: She is oriented to person, place, and time.  Well developed, well nourished, NAD, polite  HENT:  Head: Normocephalic and atraumatic.  Eyes: Pupils are equal, round, and reactive to light. No scleral icterus.  Neck: Normal range of motion. Neck supple. No thyromegaly present.  Cardiovascular: Intact distal pulses.   Tachycardic, 2/6 systolic murmur  Pulmonary/Chest: Effort normal and breath sounds normal.  Abdominal: Soft. Bowel sounds are normal. There is no tenderness.  Musculoskeletal: She exhibits no edema.  Neurological: She is alert and oriented to person, place, and time.  Skin: Skin is warm and dry. No rash noted. No erythema. No pallor.  Psychiatric: She has a normal mood and affect. Her behavior is normal. Thought content normal.  Vitals reviewed.    Assessment & Plan:   1. Chronic systolic congestive heart  failure (HCC) -Pt has recently reacquired health insurance and stated that she will reestablish care with her cardiologist. I have advised her to take her antihypertensives as soon as she can and to return here in one week to follow up on blood pressure. She was advised to fast before having her labs drawn.  - CBC with Differential/Platelet - Comprehensive metabolic panel - TSH - Lipid panel   Meds ordered this encounter  Medications  . potassium chloride SA (K-DUR,KLOR-CON)  20 MEQ tablet    Sig: Take 2 tablets (40 mEq total) by mouth daily.    Dispense:  180 tablet    Refill:  1    Order Specific Question:   Supervising Provider    Answer:   Quentin Angst [id number]  . losartan (COZAAR) 100 MG tablet    Sig: Take 1 tablet (100 mg total) by mouth daily.    Dispense:  90 tablet    Refill:  1    Order Specific Question:   Supervising Provider    Answer:   Quentin Angst [id number]  . isosorbide mononitrate (IMDUR) 30 MG 24 hr tablet    Sig: Take 1 tablet (30 mg total) by mouth daily.    Dispense:  90 tablet    Refill:  1    Order Specific Question:   Supervising Provider    Answer:   Quentin Angst [id number]  . hydrALAZINE (APRESOLINE) 25 MG tablet    Sig: Take 1 tablet (25 mg total) by mouth 4 (four) times daily.    Dispense:  360 tablet    Refill:  1    Order Specific Question:   Supervising Provider    Answer:   Quentin Angst [id number]  . furosemide (LASIX) 20 MG tablet    Sig: Take 1 tablet (20 mg total) by mouth daily.    Dispense:  90 tablet    Refill:  1    Order Specific Question:   Supervising Provider    Answer:   Quentin Angst [id number]  . carvedilol (COREG) 25 MG tablet    Sig: Take 1 tablet (25 mg total) by mouth 2 (two) times daily with a meal.    Dispense:  180 tablet    Refill:  1    Order Specific Question:   Supervising Provider    Answer:   Quentin Angst [[id number]]    Follow-up: Return in about 1 week (around 01/21/2017) for f/u with Dr. Armen Pickup.   Go to ED /call ambulance for any concerning symptoms. Loletta Specter PA

## 2017-01-14 NOTE — Telephone Encounter (Signed)
Patient will do lab work on 01/20/2017.

## 2017-01-16 MED FILL — ?CARVEDILOL 25 MG TABLET: 25 | 30 days supply | Qty: 60 | Fill #0

## 2017-01-16 MED FILL — ?POTASSIUM CL ER 20 MEQ TAB: 20 | 30 days supply | Qty: 60 | Fill #0

## 2017-01-16 MED FILL — ?ISOSORBIDE MN ER 30 MG TAB: 30 | 30 days supply | Qty: 30 | Fill #0

## 2017-01-16 MED FILL — hydrALAZINE HCL 25 MG TABS: 25 | 30 days supply | Qty: 120 | Fill #0

## 2017-01-16 MED FILL — ?FUROSEMIDE 20 MG TABLET: 20 | 30 days supply | Qty: 30 | Fill #0

## 2017-01-16 MED FILL — LOSARTAN POTASSIUM 100 MG T: 100 | 30 days supply | Qty: 30 | Fill #0

## 2017-01-19 ENCOUNTER — Emergency Department (HOSPITAL_BASED_OUTPATIENT_CLINIC_OR_DEPARTMENT_OTHER)
Admission: EM | Admit: 2017-01-19 | Discharge: 2017-01-19 | Disposition: A | Discharge status: Home / Self Care | Payer: 59 | Attending: Emergency Medicine | Admitting: Emergency Medicine

## 2017-01-19 ENCOUNTER — Encounter (HOSPITAL_BASED_OUTPATIENT_CLINIC_OR_DEPARTMENT_OTHER): Payer: Self-pay | Admitting: *Deleted

## 2017-01-19 DIAGNOSIS — Z79899 Other long term (current) drug therapy: Secondary | ICD-10-CM | POA: Insufficient documentation

## 2017-01-19 DIAGNOSIS — Z87891 Personal history of nicotine dependence: Secondary | ICD-10-CM | POA: Diagnosis not present

## 2017-01-19 DIAGNOSIS — R51 Headache: Secondary | ICD-10-CM | POA: Insufficient documentation

## 2017-01-19 DIAGNOSIS — R519 Headache, unspecified: Secondary | ICD-10-CM

## 2017-01-19 DIAGNOSIS — I5020 Unspecified systolic (congestive) heart failure: Secondary | ICD-10-CM | POA: Diagnosis not present

## 2017-01-19 MED ORDER — KETOROLAC TROMETHAMINE 30 MG/ML IJ SOLN
30.0000 mg | Freq: Once | INTRAMUSCULAR | Status: AC
  Administered 2017-01-19: 30 mg via INTRAVENOUS
  Filled 2017-01-19: qty 1

## 2017-01-19 MED ORDER — DIPHENHYDRAMINE HCL 50 MG/ML IJ SOLN
25.0000 mg | Freq: Once | INTRAMUSCULAR | Status: AC
  Administered 2017-01-19: 25 mg via INTRAVENOUS
  Filled 2017-01-19: qty 1

## 2017-01-19 MED ORDER — PROCHLORPERAZINE EDISYLATE 5 MG/ML IJ SOLN
10.0000 mg | Freq: Once | INTRAMUSCULAR | Status: AC
  Administered 2017-01-19: 10 mg via INTRAVENOUS
  Filled 2017-01-19: qty 2

## 2017-01-19 MED ORDER — SODIUM CHLORIDE 0.9 % IV BOLUS (SEPSIS)
1000.0000 mL | Freq: Once | INTRAVENOUS | Status: AC
  Administered 2017-01-19: 1000 mL via INTRAVENOUS

## 2017-01-19 NOTE — Discharge Instructions (Signed)
Return here as needed. Follow up with your PCP. Increase your fluid intake °

## 2017-01-19 NOTE — ED Triage Notes (Signed)
Headache x 2 days. No other symptoms. She has been taking Motrin for the pain.

## 2017-01-20 ENCOUNTER — Encounter: Payer: Self-pay | Admitting: Family Medicine

## 2017-01-20 ENCOUNTER — Ambulatory Visit: Payer: 59 | Attending: Family Medicine | Admitting: Family Medicine

## 2017-01-20 VITALS — BP 148/92 | HR 104 | Temp 97.8°F | Ht 65.0 in | Wt 171.2 lb

## 2017-01-20 DIAGNOSIS — I10 Essential (primary) hypertension: Secondary | ICD-10-CM | POA: Diagnosis not present

## 2017-01-20 DIAGNOSIS — I5020 Unspecified systolic (congestive) heart failure: Secondary | ICD-10-CM | POA: Diagnosis not present

## 2017-01-20 DIAGNOSIS — I110 Hypertensive heart disease with heart failure: Secondary | ICD-10-CM | POA: Diagnosis present

## 2017-01-20 DIAGNOSIS — Z87891 Personal history of nicotine dependence: Secondary | ICD-10-CM | POA: Diagnosis not present

## 2017-01-20 DIAGNOSIS — R51 Headache: Secondary | ICD-10-CM | POA: Insufficient documentation

## 2017-01-20 DIAGNOSIS — I5022 Chronic systolic (congestive) heart failure: Secondary | ICD-10-CM

## 2017-01-20 LAB — COMPREHENSIVE METABOLIC PANEL
ALT: 9 U/L (ref 6–29)
AST: 12 U/L (ref 10–30)
Albumin: 3.9 g/dL (ref 3.6–5.1)
Alkaline Phosphatase: 57 U/L (ref 33–115)
BILIRUBIN TOTAL: 0.4 mg/dL (ref 0.2–1.2)
BUN: 14 mg/dL (ref 7–25)
CO2: 24 mmol/L (ref 20–31)
CREATININE: 0.9 mg/dL (ref 0.50–1.10)
Calcium: 9.1 mg/dL (ref 8.6–10.2)
Chloride: 108 mmol/L (ref 98–110)
Glucose, Bld: 82 mg/dL (ref 65–99)
Potassium: 4 mmol/L (ref 3.5–5.3)
SODIUM: 140 mmol/L (ref 135–146)
Total Protein: 7.4 g/dL (ref 6.1–8.1)

## 2017-01-20 LAB — LIPID PANEL
CHOL/HDL RATIO: 5.4 ratio — AB (ref <5.0)
Cholesterol: 259 mg/dL — ABNORMAL HIGH (ref <200)
HDL: 48 mg/dL — ABNORMAL LOW (ref >50)
LDL Cholesterol: 194 mg/dL — ABNORMAL HIGH (ref <100)
Triglycerides: 85 mg/dL (ref <150)
VLDL: 17 mg/dL (ref <30)

## 2017-01-20 LAB — TSH: TSH: 0.41 mIU/L

## 2017-01-20 MED ORDER — CARVEDILOL 25 MG PO TABS: 12.5000 mg | ORAL_TABLET | Freq: Two times a day (BID) | ORAL | 1 refills | Status: DC

## 2017-01-20 NOTE — Assessment & Plan Note (Signed)
Add back coreg at reduce dose of 6.25 mg BID If she does not tolerate 12.5 mg BID she is advised to reduce dose further to 6.25 mg BID Cardiology referral placed

## 2017-01-20 NOTE — Patient Instructions (Addendum)
Diagnoses and all orders for this visit:  Chronic systolic heart failure (HCC) -     carvedilol (COREG) 25 MG tablet; Take 0.5 tablets (12.5 mg total) by mouth 2 (two) times daily with a meal. -     Ambulatory referral to Cardiology   Start coreg at 12.5 mg twice daily, this is half a tab If headache comes back, reduce dose to 1/4 tab 6.25 mg twice daily Goal is to be on the highest dose of beta blocker tolerated to reduce BP and protect heart muscle from changes Keep salt low Add in exercise as tolerated to help strengthen the heart muscle   F/u in 4 weeks for HTN  Dr. Armen Pickup

## 2017-01-20 NOTE — ED Provider Notes (Signed)
[facility]   CSN: [id number] Arrival date & time: 01/19/17  1046     History   Chief Complaint Chief Complaint  Patient presents with  . Headache    HPI Catherine Gross is a 46 y.o. female.  HPI Patient presents to the emergency department with a 2 day history of migraine headache.  The patient states that she has had migraine headaches in the past.  She states that she does not take regular medicines for them.  She states nothing seems make her condition better, but light and sound make the pain in her head, worse.  Patient states that she took over-the-counter ibuprofen without relief of her symptoms. The patient denies chest pain, shortness of breath,blurred vision, neck pain, fever, cough, weakness, numbness, dizziness, anorexia, edema, abdominal pain, , vomiting, diarrhea, rash, back pain, dysuria, hematemesis, bloody stool, near syncope, or syncope. Past Medical History:  Diagnosis Date  . Preeclampsia    first pregnancy, not with susequent pregnancy    Patient Active Problem List   Diagnosis Date Noted  . Grief reaction 09/05/2015  . Dilated cardiomyopathy (HCC) 04/02/2015  . Systolic heart failure (HCC) 04/02/2015  . Accelerated hypertension 03/22/2015  . Sinus tachycardia 03/22/2015  . Normocytic anemia     Past Surgical History:  Procedure Laterality Date  . CESAREAN SECTION      OB History    No data available       Home Medications    Prior to Admission medications   Medication Sig Start Date End Date Taking? Authorizing Provider  carvedilol (COREG) 25 MG tablet Take 0.5 tablets (12.5 mg total) by mouth 2 (two) times daily with a meal. 01/20/17 07/19/17  Josalyn Funches, MD  furosemide (LASIX) 20 MG tablet Take 1 tablet (20 mg total) by mouth daily. 01/14/17 07/13/17  Loletta Specter, PA  hydrALAZINE (APRESOLINE) 25 MG tablet Take 1 tablet (25 mg total) by mouth 4 (four) times daily. 01/14/17 07/13/17  Loletta Specter, PA    isosorbide mononitrate (IMDUR) 30 MG 24 hr tablet Take 1 tablet (30 mg total) by mouth daily. 01/14/17 07/13/17  Loletta Specter, PA  losartan (COZAAR) 100 MG tablet Take 1 tablet (100 mg total) by mouth daily. 01/14/17 07/13/17  Loletta Specter, PA  potassium chloride SA (K-DUR,KLOR-CON) 20 MEQ tablet Take 2 tablets (40 mEq total) by mouth daily. 01/14/17 07/13/17  Loletta Specter, PA    Family History Family History  Problem Relation Age of Onset  . Heart failure Father   . Hypertension Father     Social History Social History  Substance Use Topics  . Smoking status: Former Smoker    Packs/day: 0.50    Years: 20.00    Quit date: 03/22/2015  . Smokeless tobacco: Former Neurosurgeon  . Alcohol use No     Allergies   Patient has no known allergies.   Review of Systems Review of Systems All other systems negative except as documented in the HPI. All pertinent positives and negatives as reviewed in the HPI.  Physical Exam Updated Vital Signs BP 149/84 (BP Location: Right Arm)   Pulse 105   Temp 98.7 F (37.1 C) (Oral)   Resp 18   Ht 5\' 5"  (1.651 m)   Wt 77.2 kg   LMP 01/02/2017   SpO2 99%   BMI 28.34 kg/m   Physical Exam  Constitutional: She is oriented to person, place, and time. She appears well-developed and well-nourished. No distress.  HENT:  Head: Normocephalic and atraumatic.  Mouth/Throat: Oropharynx is clear and moist.  Eyes: Pupils are equal, round, and reactive to light.  Neck: Normal range of motion. Neck supple.  Cardiovascular: Normal rate, regular rhythm and normal heart sounds.  Exam reveals no gallop and no friction rub.   No murmur heard. Pulmonary/Chest: Effort normal and breath sounds normal. No respiratory distress. She has no wheezes.  Abdominal: Soft. Bowel sounds are normal. She exhibits no distension. There is no tenderness.  Neurological: She is alert and oriented to person, place, and time. She displays normal reflexes. No sensory deficit. She  exhibits normal muscle tone. Coordination normal.  Skin: Skin is warm and dry. No rash noted. No erythema.  Psychiatric: She has a normal mood and affect. Her behavior is normal.  Nursing note and vitals reviewed.    ED Treatments / Results  Labs (all labs ordered are listed, but only abnormal results are displayed) Labs Reviewed - No data to display  EKG  EKG Interpretation None       Radiology No results found.  Procedures Procedures (including critical care time)  Medications Ordered in ED Medications  sodium chloride 0.9 % bolus 1,000 mL (0 mLs Intravenous Stopped 01/19/17 1444)  ketorolac (TORADOL) 30 MG/ML injection 30 mg (30 mg Intravenous Given 01/19/17 1258)  prochlorperazine (COMPAZINE) injection 10 mg (10 mg Intravenous Given 01/19/17 1258)  diphenhydrAMINE (BENADRYL) injection 25 mg (25 mg Intravenous Given 01/19/17 1258)     Initial Impression / Assessment and Plan / ED Course  I have reviewed the triage vital signs and the nursing notes.  Pertinent labs & imaging results that were available during my care of the patient were reviewed by me and considered in my medical decision making (see chart for details).    Patient was given IV Toradol, Compazine and Benadryl with IV fluids and she is feeling complete resolution of her symptoms.  Advised patient to return here as needed.  Patient agrees the plan and all questions were answered   Final Clinical Impressions(s) / ED Diagnoses   Final diagnoses:  Acute nonintractable headache, unspecified headache type    New Prescriptions Discharge Medication List as of 01/19/2017  2:39 PM       Charlestine Night, PA-C 01/23/17 0112    Melene Plan, DO 01/23/17 8119

## 2017-01-20 NOTE — Progress Notes (Signed)
Pt has stopped taking COREG medication on Sunday due to headaches.

## 2017-01-20 NOTE — Progress Notes (Signed)
Subjective:  Patient ID: Catherine Gross, female    DOB: [birth year]  Age: 46 y.o. MRN: [id number]  CC: Hypertension  HPI Catherine Gross has CHF (EF 35-40 % 03/2015) and HTN she presents for   1. CHRONIC HYPERTENSION  Disease Monitoring  Blood pressure range: not checking   Chest pain: no   Dyspnea: no   Claudication: no   Medication compliance: yes, but she stopped coreg after episode of severe headache that started when she restarted lasix, hydralazine, imdur and losartan. She went to ED on yesterday for headache.  Medication Side Effects  Lightheadedness: no   Urinary frequency: no   Edema: no    Social History  Substance Use Topics  . Smoking status: Former Smoker    Packs/day: 0.50    Years: 20.00    Quit date: 03/22/2015  . Smokeless tobacco: Former Neurosurgeon  . Alcohol use No    Outpatient Medications Prior to Visit  Medication Sig Dispense Refill  . furosemide (LASIX) 20 MG tablet Take 1 tablet (20 mg total) by mouth daily. 90 tablet 1  . hydrALAZINE (APRESOLINE) 25 MG tablet Take 1 tablet (25 mg total) by mouth 4 (four) times daily. 360 tablet 1  . isosorbide mononitrate (IMDUR) 30 MG 24 hr tablet Take 1 tablet (30 mg total) by mouth daily. 90 tablet 1  . LORazepam (ATIVAN) 0.5 MG tablet Take 1-2 tablets (0.5-1 mg total) by mouth at bedtime as needed for sleep. 30 tablet 0  . losartan (COZAAR) 100 MG tablet Take 1 tablet (100 mg total) by mouth daily. 90 tablet 1  . potassium chloride SA (K-DUR,KLOR-CON) 20 MEQ tablet Take 2 tablets (40 mEq total) by mouth daily. 180 tablet 1  . carvedilol (COREG) 25 MG tablet Take 1 tablet (25 mg total) by mouth 2 (two) times daily with a meal. (Patient not taking: Reported on 01/20/2017) 180 tablet 1   No facility-administered medications prior to visit.     ROS Review of Systems  Constitutional: Negative for appetite change, chills and fever.  Eyes: Negative for visual disturbance.  Respiratory: Negative for shortness of breath.     Cardiovascular: Negative for chest pain.  Gastrointestinal: Negative for abdominal pain and blood in stool.  Musculoskeletal: Negative for arthralgias and back pain.  Skin: Negative for rash.  Allergic/Immunologic: Negative for immunocompromised state.  Hematological: Negative for adenopathy. Does not bruise/bleed easily.  Psychiatric/Behavioral: Negative for dysphoric mood, sleep disturbance and suicidal ideas.    Objective:  BP (!) 148/92 (BP Location: Left Arm, Patient Position: Sitting, Cuff Size: Small)   Pulse (!) 104   Temp 97.8 F (36.6 C) (Oral)   Ht 5\' 5"  (1.651 m)   Wt 171 lb 3.2 oz (77.7 kg)   LMP 01/02/2017   SpO2 99%   BMI 28.49 kg/m   BP/Weight 01/20/2017 01/19/2017 01/14/2017  Systolic BP 148 149 158  Diastolic BP 92 84 113  Wt. (Lbs) 171.2 170.3 176.6  BMI 28.49 28.34 29.39   Physical Exam  Constitutional: She is oriented to person, place, and time. She appears well-developed and well-nourished. No distress.  HENT:  Head: Normocephalic and atraumatic.  Cardiovascular: Normal rate, regular rhythm, normal heart sounds and intact distal pulses.   Pulmonary/Chest: Effort normal and breath sounds normal.  Musculoskeletal: She exhibits no edema.  Neurological: She is alert and oriented to person, place, and time.  Skin: Skin is warm and dry. No rash noted.  Psychiatric: She has a normal mood and affect.  Assessment & Plan:   Problem List Items Addressed This Visit      High   Systolic heart failure (HCC) - Primary   Relevant Medications   carvedilol (COREG) 25 MG tablet   Other Relevant Orders   Ambulatory referral to Cardiology      No orders of the defined types were placed in this encounter.   Follow-up: Return in about 4 weeks (around 02/17/2017) for HTN .   Dessa Phi MD

## 2017-01-20 NOTE — Assessment & Plan Note (Signed)
Slightly elevated with tachycardia Add back coreg at reduced dose  Goal BP < 140/90

## 2017-01-21 ENCOUNTER — Other Ambulatory Visit: Payer: Self-pay | Admitting: Physician Assistant

## 2017-01-21 DIAGNOSIS — E7800 Pure hypercholesterolemia, unspecified: Secondary | ICD-10-CM

## 2017-01-21 LAB — CBC WITH DIFFERENTIAL/PLATELET
BASOS PCT: 1 %
Basophils Absolute: 44 cells/uL (ref 0–200)
Eosinophils Absolute: 88 cells/uL (ref 15–500)
Eosinophils Relative: 2 %
HEMATOCRIT: 35.6 % (ref 35.0–45.0)
Hemoglobin: 10.9 g/dL — ABNORMAL LOW (ref 11.7–15.5)
LYMPHS PCT: 32 %
Lymphs Abs: 1408 cells/uL (ref 850–3900)
MCH: 25.5 pg — AB (ref 27.0–33.0)
MCHC: 30.6 g/dL — ABNORMAL LOW (ref 32.0–36.0)
MCV: 83.4 fL (ref 80.0–100.0)
MONO ABS: 308 {cells}/uL (ref 200–950)
MONOS PCT: 7 %
MPV: 10.2 fL (ref 7.5–12.5)
Neutro Abs: 2552 cells/uL (ref 1500–7800)
Neutrophils Relative %: 58 %
Platelets: 331 10*3/uL (ref 140–400)
RBC: 4.27 MIL/uL (ref 3.80–5.10)
RDW: 17.2 % — ABNORMAL HIGH (ref 11.0–15.0)
WBC: 4.4 10*3/uL (ref 3.8–10.8)

## 2017-01-21 MED ORDER — SIMVASTATIN 20 MG PO TABS: 20.0000 mg | ORAL_TABLET | Freq: Every day | ORAL | 2 refills | Status: DC

## 2017-01-21 NOTE — Progress Notes (Signed)
LDL 194. Start with 20mg  Simvastatin then possible increase to 40mg .

## 2017-01-22 ENCOUNTER — Telehealth: Payer: Self-pay | Admitting: *Deleted

## 2017-01-22 ENCOUNTER — Other Ambulatory Visit: Payer: Self-pay | Admitting: *Deleted

## 2017-01-22 DIAGNOSIS — I5022 Chronic systolic (congestive) heart failure: Secondary | ICD-10-CM

## 2017-01-22 DIAGNOSIS — E7800 Pure hypercholesterolemia, unspecified: Secondary | ICD-10-CM

## 2017-01-22 NOTE — Telephone Encounter (Signed)
Pt states the isosorbide is giving her headaches. Please advise.

## 2017-01-23 NOTE — Telephone Encounter (Signed)
Pt was called and informed of dr funches orders.. Pt needs a ECHO. I will schedule echo and call pt with appointment.

## 2017-01-23 NOTE — Telephone Encounter (Signed)
Patient may take imdur every other day until she adjust to it Headache is a common side effect of imdur  Also ECHO ordered, if heart failure has improved she may be able to transition off imdur. Please schedule

## 2017-01-26 NOTE — Telephone Encounter (Signed)
Pt aware of message

## 2017-01-26 NOTE — Telephone Encounter (Signed)
Pt aware apt is scheduled for 02/03/2017 at  at 2:00pm.

## 2017-01-26 NOTE — Telephone Encounter (Signed)
Good Morning  No PA Require for the Procedure Ref# [id number]

## 2017-02-03 ENCOUNTER — Telehealth: Payer: Self-pay | Admitting: Family Medicine

## 2017-02-03 ENCOUNTER — Ambulatory Visit (HOSPITAL_COMMUNITY)
Admission: RE | Admit: 2017-02-03 | Discharge: 2017-02-03 | Disposition: A | Discharge status: Home / Self Care | Payer: 59 | Source: Ambulatory Visit | Attending: Family Medicine | Admitting: Family Medicine

## 2017-02-03 DIAGNOSIS — I501 Left ventricular failure: Secondary | ICD-10-CM | POA: Insufficient documentation

## 2017-02-03 DIAGNOSIS — I340 Nonrheumatic mitral (valve) insufficiency: Secondary | ICD-10-CM | POA: Insufficient documentation

## 2017-02-03 DIAGNOSIS — I361 Nonrheumatic tricuspid (valve) insufficiency: Secondary | ICD-10-CM | POA: Diagnosis not present

## 2017-02-03 DIAGNOSIS — I509 Heart failure, unspecified: Secondary | ICD-10-CM | POA: Diagnosis present

## 2017-02-03 DIAGNOSIS — I351 Nonrheumatic aortic (valve) insufficiency: Secondary | ICD-10-CM | POA: Insufficient documentation

## 2017-02-03 DIAGNOSIS — I371 Nonrheumatic pulmonary valve insufficiency: Secondary | ICD-10-CM | POA: Diagnosis not present

## 2017-02-03 DIAGNOSIS — E7800 Pure hypercholesterolemia, unspecified: Secondary | ICD-10-CM

## 2017-02-03 DIAGNOSIS — I5022 Chronic systolic (congestive) heart failure: Secondary | ICD-10-CM

## 2017-02-03 NOTE — Telephone Encounter (Signed)
Please send simvastatin (ZOCOR) 20 MG tablet to our pharmacy. Pt will not be using [facility].   Thank you.

## 2017-02-03 NOTE — Progress Notes (Signed)
  Echocardiogram 2D Echocardiogram has been performed.  Delcie Roch 02/03/2017, 2:50 PM

## 2017-02-04 MED ORDER — SIMVASTATIN 20 MG PO TABS: 20.0000 mg | ORAL_TABLET | Freq: Every day | ORAL | 2 refills | Status: DC

## 2017-02-04 MED FILL — ?SIMVASTATIN 20 MG TABLET: 20 MG | 30 days supply | Qty: 30 | Fill #0

## 2017-02-04 NOTE — Telephone Encounter (Signed)
Simvastatin refilled. 

## 2017-02-05 ENCOUNTER — Other Ambulatory Visit: Payer: Self-pay

## 2017-02-05 ENCOUNTER — Telehealth: Payer: Self-pay

## 2017-02-05 ENCOUNTER — Telehealth: Payer: Self-pay | Admitting: Family Medicine

## 2017-02-05 MED ORDER — FUROSEMIDE 40 MG PO TABS: 40.0000 mg | ORAL_TABLET | Freq: Every day | ORAL | 5 refills | Status: DC

## 2017-02-05 MED ORDER — ISOSORBIDE MONONITRATE ER 30 MG PO TB24: 30.0000 mg | ORAL_TABLET | Freq: Every day | ORAL | 1 refills | Status: DC

## 2017-02-05 MED FILL — ?FUROSEMIDE 40 MG TABLET: 40 | 30 days supply | Qty: 30 | Fill #0

## 2017-02-05 NOTE — Telephone Encounter (Signed)
Pt was called and informed of ECHO results. 

## 2017-02-05 NOTE — Telephone Encounter (Signed)
Please inform patient lasix increase to 40 mg daily, she can take 1 tablet daily for high BP and swelling.   She is advised to reduce imdur to 30 mg every other day until headache improves, she can increase back to daily once she gets used to it.

## 2017-02-05 NOTE — Telephone Encounter (Signed)
Yes the imdur is definitely the source of headache This is a side effect of the medication She

## 2017-02-05 NOTE — Telephone Encounter (Signed)
Will route to PCP 

## 2017-02-05 NOTE — Telephone Encounter (Signed)
Pt. Called requesting to speak with her nurse regarding her furosemide (LASIX) 20 MG tablet  Pt. States that her PCP had told her that if she had swelling to take more pills. Pt. Called the pharmacy  And she can not get a refill b/c it is to early. Pt. Also states that isosorbide mononitrate (IMDUR) 30 MG  Is given her headaches. Pt. States that over the counter medication is not helping with her headaches.  Please f/u.

## 2017-02-05 NOTE — Addendum Note (Signed)
Addended by: Dessa Phi on: 02/05/2017 01:44 PM   Modules accepted: Orders

## 2017-02-05 NOTE — Telephone Encounter (Signed)
Pt was called and given instruction per dr Armen Pickup. Pt states that she knows the IMDUR is giving her headaches, I explained to the pt to try and take the medication as directed by dr Armen Pickup to see if that's what giving her headaches pt refused and states that she would like medication for her headaches .

## 2017-02-05 NOTE — Telephone Encounter (Signed)
Yes the imdur is definitely the source of headache This is a side effect of the medication She is advised to stop the medication if she does not want to try a taper  There is no need to give medication for headache since the headache source is known   The plan will be to manage BP and fluid without imdur

## 2017-02-05 NOTE — Telephone Encounter (Signed)
Pt was called and pt states that she will stop taking the medication and she will discuss other medication at follow up visit on 3/13

## 2017-02-13 ENCOUNTER — Encounter: Payer: Self-pay | Admitting: *Deleted

## 2017-02-17 ENCOUNTER — Ambulatory Visit: Payer: 59 | Admitting: Family Medicine

## 2017-02-18 MED FILL — CARVEDILOL 25 MG TABLET: 25 | 30 days supply | Qty: 60 | Fill #1

## 2017-02-18 MED FILL — LOSARTAN POTASSIUM 100 MG T: 100 | 30 days supply | Qty: 30 | Fill #1

## 2017-02-20 ENCOUNTER — Ambulatory Visit (INDEPENDENT_AMBULATORY_CARE_PROVIDER_SITE_OTHER): Payer: 59 | Admitting: Cardiology

## 2017-02-20 ENCOUNTER — Encounter: Payer: Self-pay | Admitting: Cardiology

## 2017-02-20 ENCOUNTER — Encounter (INDEPENDENT_AMBULATORY_CARE_PROVIDER_SITE_OTHER): Payer: Self-pay

## 2017-02-20 VITALS — BP 132/86 | HR 96 | Ht 65.0 in | Wt 173.0 lb

## 2017-02-20 DIAGNOSIS — I420 Dilated cardiomyopathy: Secondary | ICD-10-CM

## 2017-02-20 DIAGNOSIS — E7800 Pure hypercholesterolemia, unspecified: Secondary | ICD-10-CM | POA: Diagnosis not present

## 2017-02-20 DIAGNOSIS — I5022 Chronic systolic (congestive) heart failure: Secondary | ICD-10-CM

## 2017-02-20 DIAGNOSIS — I340 Nonrheumatic mitral (valve) insufficiency: Secondary | ICD-10-CM

## 2017-02-20 DIAGNOSIS — I2729 Other secondary pulmonary hypertension: Secondary | ICD-10-CM | POA: Diagnosis not present

## 2017-02-20 MED ORDER — SACUBITRIL-VALSARTAN 49-51 MG PO TABS: 1.0000 | ORAL_TABLET | Freq: Two times a day (BID) | ORAL | 3 refills | Status: DC

## 2017-02-20 MED ORDER — SACUBITRIL-VALSARTAN 49-51 MG PO TABS: 1.0000 | ORAL_TABLET | Freq: Two times a day (BID) | ORAL | 0 refills | Status: DC

## 2017-02-20 MED FILL — hydrALAZINE HCL 25 MG TABS: 25 | 30 days supply | Qty: 120 | Fill #1

## 2017-02-20 NOTE — Progress Notes (Signed)
Cardiology Office Note    Date:  02/20/2017   ID:  Catherine Gross, DOB [birth year], MRN [id number]  PCP:  Lora Paula, MD  Cardiologist:   Donato Schultz, MD     History of Present Illness:  Catherine Gross is a 46 y.o. female has a history of cardiomyopathy with ejection fraction of 35%, severe mitral regurgitation former patient of Dr. Yevonne Pax last seen on 06/06/15 here for follow-up.  No chest pain, no shortness of breath, no orthopnea. [facility] - Billing department.  Isosorbide gave HA. Now better.    Past Medical History:  Diagnosis Date  . Preeclampsia    first pregnancy, not with susequent pregnancy    Past Surgical History:  Procedure Laterality Date  . CESAREAN SECTION      Current Medications: Outpatient Medications Prior to Visit  Medication Sig Dispense Refill  . furosemide (LASIX) 40 MG tablet Take 1 tablet (40 mg total) by mouth daily. 30 tablet 5  . potassium chloride SA (K-DUR,KLOR-CON) 20 MEQ tablet Take 2 tablets (40 mEq total) by mouth daily. 180 tablet 1  . simvastatin (ZOCOR) 20 MG tablet Take 1 tablet (20 mg total) by mouth at bedtime. 30 tablet 2  . losartan (COZAAR) 100 MG tablet Take 1 tablet (100 mg total) by mouth daily. 90 tablet 1  . carvedilol (COREG) 25 MG tablet Take 0.5 tablets (12.5 mg total) by mouth 2 (two) times daily with a meal. (Patient not taking: Reported on 02/20/2017) 60 tablet 1  . hydrALAZINE (APRESOLINE) 25 MG tablet Take 1 tablet (25 mg total) by mouth 4 (four) times daily. (Patient not taking: Reported on 02/20/2017) 360 tablet 1   No facility-administered medications prior to visit.      Allergies:   Patient has no known allergies.   Social History   Social History  . Marital status: Single    Spouse name: N/A  . Number of children: N/A  . Years of education: N/A   Social History Main Topics  . Smoking status: Former Smoker    Packs/day: 0.50    Years: 20.00    Quit date: 03/22/2015  . Smokeless tobacco: Former  Neurosurgeon  . Alcohol use No  . Drug use: No  . Sexual activity: Not Asked   Other Topics Concern  . None   Social History Narrative  . None     Family History:  The patient's family history includes Alzheimer's disease in her mother; Asthma in her brother; Congestive Heart Failure in her father; Heart failure in her father; Hypertension in her father.   ROS:   Please see the history of present illness.    ROS All other systems reviewed and are negative.   PHYSICAL EXAM:   VS:  BP 132/86   Pulse 96   Ht  (1.651 m)   Wt 173 lb (78.5 kg)   SpO2 97%   BMI 28.79 kg/m    GEN: Well nourished, well developed, in no acute distress  HEENT: normal  Neck: no JVD, carotid bruits, or masses Cardiac: RRR; no murmurs, rubs, or gallops,no edema  Respiratory:  clear to auscultation bilaterally, normal work of breathing GI: soft, nontender, nondistended, + BS MS: no deformity or atrophy  Skin: warm and dry, no rash Neuro:  Alert and Oriented x 3, Strength and sensation are intact Psych: euthymic mood, full affect  Wt Readings from Last 3 Encounters:  02/20/17 173 lb (78.5 kg)  01/20/17 171 lb 3.2 oz (77.7 kg)  01/19/17  170 lb 4.8 oz (77.2 kg)      Studies/Labs Reviewed:   EKG:  EKG is ordered today.  The ekg ordered today demonstrates A 02/20/17-sinus rhythm 88 with no other abnormality   Recent Labs: 01/20/2017: ALT 9; BUN 14; Creat 0.90; Hemoglobin 10.9; Platelets 331; Potassium 4.0; Sodium 140; TSH 0.41   Lipid Panel    Component Value Date/Time   CHOL 259 (H) 01/20/2017 1052   TRIG 85 01/20/2017 1052   HDL 48 (L) 01/20/2017 1052   CHOLHDL 5.4 (H) 01/20/2017 1052   VLDL 17 01/20/2017 1052   LDLCALC 194 (H) 01/20/2017 1052    Additional studies/ records that were reviewed today include:   - Left ventricle: The cavity size was moderately dilated. Systolic   function was moderately reduced. The estimated ejection fraction   was in the range of 35% to 40%. Diffuse  hypokinesis. Doppler   parameters are consistent with a reversible restrictive pattern,   indicative of decreased left ventricular diastolic compliance   and/or increased left atrial pressure (grade 3 diastolic   dysfunction). - Aortic valve: Transvalvular velocity was within the normal range.   There was no stenosis. There was moderate regurgitation. - Mitral valve: Moderately calcified annulus. Transvalvular   velocity was within the normal range. There was no evidence for   stenosis. There was severe regurgitation. - Left atrium: The atrium was severely dilated. - Right ventricle: The cavity size was normal. Wall thickness was   normal. Systolic function was normal. - Atrial septum: No defect or patent foramen ovale was identified   by color flow Doppler. - Tricuspid valve: There was moderate regurgitation. - Pulmonic valve: There was moderate regurgitation. - Pulmonary arteries: Systolic pressure was moderately increased.   PA peak pressure: 55 mm Hg (S).    ASSESSMENT:    1. Dilated cardiomyopathy (HCC)   2. Chronic systolic heart failure (HCC)   3. Severe mitral regurgitation   4. Pure hypercholesterolemia   5. Other secondary pulmonary hypertension      PLAN:  In order of problems listed above:  Dilated cardiomyopathy  - Stable, asymptomatic currently.  - on carvedilol, hydralazine, losartan, Lasix.  - I would like to start Entresto 49/51 twice a day since she is already taking losartan 100 mg a day. We will stop losartan. This has been shown in studies to reduce hospitalizations as well as reduction in mortality. We will see her back in 2 weeks and if blood pressure is able to tolerate, we will increase to target maintenance dose of 97/103 twice daily  Chronic systolic heart failure  - NYHA class 2 currently.  - Medications reviewed as above.   Hypertensive heart disease with heart failure  - Blood pressure today well controlled, would like systolic less than  130.Marland Kitchen Primary physician doing an excellent job.  Hyperlipidemia  - Zocor 20 mg.  Secondary pulmonary hypertension  - 55 mmHg from left heart disease.    Medication Adjustments/Labs and Tests Ordered: Current medicines are reviewed at length with the patient today.  Concerns regarding medicines are outlined above.  Medication changes, Labs and Tests ordered today are listed in the Patient Instructions below. Patient Instructions  Medication Instructions:  Please discontinue your Losartan. Start Entresto 49-51 mg twice a day. Continue all other medications as listed.  Follow-Up: Follow up in approximately 2 weeks.  If you need a refill on your cardiac medications before your next appointment, please call your pharmacy.  Thank you for choosing [facility]!!  Signed, Donato Schultz, MD  02/20/2017 3:46 PM    [facility] [address] [place], [place], [place]  [address] Phone: [phone]; Fax: [phone]

## 2017-02-20 NOTE — Patient Instructions (Signed)
Medication Instructions:  Please discontinue your Losartan. Start Entresto 49-51 mg twice a day. Continue all other medications as listed.  Follow-Up: Follow up in approximately 2 weeks.  If you need a refill on your cardiac medications before your next appointment, please call your pharmacy.  Thank you for choosing [facility]!!

## 2017-02-23 ENCOUNTER — Ambulatory Visit: Payer: 59 | Attending: Family Medicine | Admitting: Family Medicine

## 2017-02-23 ENCOUNTER — Encounter: Payer: Self-pay | Admitting: Family Medicine

## 2017-02-23 VITALS — BP 123/81 | HR 98 | Temp 97.4°F | Resp 18 | Ht 65.0 in | Wt 172.4 lb

## 2017-02-23 DIAGNOSIS — I10 Essential (primary) hypertension: Secondary | ICD-10-CM | POA: Diagnosis not present

## 2017-02-23 DIAGNOSIS — I509 Heart failure, unspecified: Secondary | ICD-10-CM | POA: Insufficient documentation

## 2017-02-23 DIAGNOSIS — Z87891 Personal history of nicotine dependence: Secondary | ICD-10-CM | POA: Diagnosis not present

## 2017-02-23 DIAGNOSIS — I110 Hypertensive heart disease with heart failure: Secondary | ICD-10-CM | POA: Diagnosis not present

## 2017-02-23 DIAGNOSIS — I5022 Chronic systolic (congestive) heart failure: Secondary | ICD-10-CM

## 2017-02-23 NOTE — Progress Notes (Signed)
Patient is here for HTN FU  Patient denies pain at this time.  Patient has taken medication today. Patient has eaten today.  Patient denies any suicidal ideations at this time.

## 2017-02-23 NOTE — Assessment & Plan Note (Addendum)
A: HTN well controlled, no evidence of fluid overload  Med: compliant P: Stop losartan  Continue Entresto, hydralazine, lasix, coreg at current doses  2 weeks f/u with cardiology for BP check, med adjustment, labs (BMP) 3 month f/u with me

## 2017-02-23 NOTE — Progress Notes (Signed)
Subjective:  Patient ID: Catherine Gross, female    DOB: [birth year]  Age: 46 y.o. MRN: [id number]  CC: Hypertension  HPI Catherine Gross has CHF (EF 35-40 % 03/2015) and HTN she presents for   1. CHRONIC HYPERTENSION  Disease Monitoring  Blood pressure range: not checking   Chest pain: no   Dyspnea: no   Claudication: no   Medication compliance: yes. She started enteresto 2 days ago per her cardiologist. She continues to take losartan as she was not sure which one to stop. She tolerates coreg 25 mg BID, hydralazine 25 mg TID, lasix 40 mg daily with potassium 40 mEq daily.  Medication Side Effects  Lightheadedness: no   Urinary frequency: no   Edema: no    Social History  Substance Use Topics  . Smoking status: Former Smoker    Packs/day: 0.50    Years: 20.00    Quit date: 03/22/2015  . Smokeless tobacco: Former Neurosurgeon  . Alcohol use No    Outpatient Medications Prior to Visit  Medication Sig Dispense Refill  . carvedilol (COREG) 25 MG tablet Take 25 mg by mouth 2 (two) times daily with a meal.    . furosemide (LASIX) 40 MG tablet Take 1 tablet (40 mg total) by mouth daily. 30 tablet 5  . hydrALAZINE (APRESOLINE) 25 MG tablet Take 25 mg by mouth 3 (three) times daily.    . potassium chloride SA (K-DUR,KLOR-CON) 20 MEQ tablet Take 2 tablets (40 mEq total) by mouth daily. 180 tablet 1  . sacubitril-valsartan (ENTRESTO) 49-51 MG Take 1 tablet by mouth 2 (two) times daily. 60 tablet 0  . simvastatin (ZOCOR) 20 MG tablet Take 1 tablet (20 mg total) by mouth at bedtime. 30 tablet 2   No facility-administered medications prior to visit.     ROS Review of Systems  Constitutional: Negative for appetite change, chills and fever.  Eyes: Negative for visual disturbance.  Respiratory: Negative for shortness of breath.   Cardiovascular: Negative for chest pain.  Gastrointestinal: Negative for abdominal pain and blood in stool.  Musculoskeletal: Negative for arthralgias and back pain.    Skin: Negative for rash.  Allergic/Immunologic: Negative for immunocompromised state.  Hematological: Negative for adenopathy. Does not bruise/bleed easily.  Psychiatric/Behavioral: Negative for dysphoric mood, sleep disturbance and suicidal ideas.    Objective:  BP 123/81 (BP Location: Left Arm, Patient Position: Sitting, Cuff Size: Normal)   Pulse 98   Temp 97.4 F (36.3 C) (Oral)   Resp 18   Ht 5\' 5"  (1.651 m)   Wt 172 lb 6.4 oz (78.2 kg)   LMP 02/11/2017   SpO2 100%   BMI 28.69 kg/m   BP/Weight 02/23/2017 02/20/2017 01/20/2017  Systolic BP 123 132 148  Diastolic BP 81 86 92  Wt. (Lbs) 172.4 173 171.2  BMI 28.69 28.79 28.49   Physical Exam  Constitutional: She is oriented to person, place, and time. She appears well-developed and well-nourished. No distress.  HENT:  Head: Normocephalic and atraumatic.  Cardiovascular: Normal rate, regular rhythm, normal heart sounds and intact distal pulses.   Pulmonary/Chest: Effort normal and breath sounds normal.  Musculoskeletal: She exhibits no edema.  Neurological: She is alert and oriented to person, place, and time.  Skin: Skin is warm and dry. No rash noted.  Psychiatric: She has a normal mood and affect.     Assessment & Plan:   Problem List Items Addressed This Visit      High   Systolic heart failure (HCC)  Accelerated hypertension - Primary    A: HTN well controlled, no evidence of fluid overload  Med: compliant P: Stop losartan  Continue Entresto, hydralazine, lasix, coreg at current doses  2 weeks f/u with cardiology for BP check, med adjustment, labs (BMP) 3 month f/u with me          No orders of the defined types were placed in this encounter.   Follow-up: Return in about 3 months (around 05/26/2017) for HTN .   Dessa Phi MD

## 2017-02-23 NOTE — Patient Instructions (Addendum)
Catherine Gross was seen today for hypertension.  Diagnoses and all orders for this visit:  Accelerated hypertension  Chronic systolic heart failure (HCC)   STOP losartan Take entresto   Keep 2 week follow up with cardiology, please have potassium level checked at that visit  F/u in 3 months for HTN , sooner if needed  Dr. Armen Pickup

## 2017-03-09 ENCOUNTER — Encounter (INDEPENDENT_AMBULATORY_CARE_PROVIDER_SITE_OTHER): Payer: Self-pay

## 2017-03-09 ENCOUNTER — Encounter: Payer: Self-pay | Admitting: Cardiology

## 2017-03-09 ENCOUNTER — Ambulatory Visit (INDEPENDENT_AMBULATORY_CARE_PROVIDER_SITE_OTHER): Payer: 59 | Admitting: Cardiology

## 2017-03-09 VITALS — BP 120/76 | HR 79 | Ht 65.0 in | Wt 171.8 lb

## 2017-03-09 DIAGNOSIS — I420 Dilated cardiomyopathy: Secondary | ICD-10-CM | POA: Diagnosis not present

## 2017-03-09 DIAGNOSIS — I5022 Chronic systolic (congestive) heart failure: Secondary | ICD-10-CM

## 2017-03-09 DIAGNOSIS — Z5189 Encounter for other specified aftercare: Secondary | ICD-10-CM | POA: Diagnosis not present

## 2017-03-09 DIAGNOSIS — I340 Nonrheumatic mitral (valve) insufficiency: Secondary | ICD-10-CM | POA: Diagnosis not present

## 2017-03-09 MED FILL — ?POTASSIUM CL ER 20 MEQ TAB: 20 | 30 days supply | Qty: 60 | Fill #1

## 2017-03-09 MED FILL — ENTRESTO 49 MG-51 MG TABLET: 49-51 | 30 days supply | Qty: 60 | Fill #0

## 2017-03-09 MED FILL — ?FUROSEMIDE 40 MG TABLET: 40 | 30 days supply | Qty: 30 | Fill #1

## 2017-03-09 NOTE — Progress Notes (Signed)
Cardiology Office Note   Date:  03/09/2017   ID:  Catherine Gross, DOB July 06, 1971, MRN [id number]  PCP:  Lora Paula, MD  Cardiologist: Dr. Anne Fu     Chief Complaint  Patient presents with  . Blood Pressure Check      History of Present Illness: Catherine Gross is a 46 y.o. female who presents for Entresto follow up.  49/51 BID. Hope to increase today 97/103.     Sh e has a history of cardiomyopathy with ejection fraction of 35%- new 05/2015. Severe mitral regurgitation former patient of Dr. Yevonne Pax.  Last echo 02/03/17 with EF 35-40%, diffuse hypokinesis, reversible restrictive pattern, G3 DD.  Mod AVR, severe MR. Mod. TR  Mod. Pulmonic valve.  PA pk pressure was 55 mmHg.   Today she has no SOB, no lightheadedness no Chest pain.  She really cannot feel any difference with new medication.  BP is stable.    Past Medical History:  Diagnosis Date  . Benign essential HTN   . Cardiomyopathy (HCC)   . Mitral regurgitation   . Preeclampsia    first pregnancy, not with susequent pregnancy    Past Surgical History:  Procedure Laterality Date  . CESAREAN SECTION       Current Outpatient Prescriptions  Medication Sig Dispense Refill  . carvedilol (COREG) 25 MG tablet Take 25 mg by mouth 2 (two) times daily with a meal.    . furosemide (LASIX) 40 MG tablet Take 1 tablet (40 mg total) by mouth daily. 30 tablet 5  . hydrALAZINE (APRESOLINE) 25 MG tablet Take 25 mg by mouth 3 (three) times daily.    . potassium chloride SA (K-DUR,KLOR-CON) 20 MEQ tablet Take 2 tablets (40 mEq total) by mouth daily. 180 tablet 1  . sacubitril-valsartan (ENTRESTO) 49-51 MG Take 1 tablet by mouth 2 (two) times daily. 60 tablet 0  . simvastatin (ZOCOR) 20 MG tablet Take 1 tablet (20 mg total) by mouth at bedtime. 30 tablet 2   No current facility-administered medications for this visit.     Allergies:   Patient has no known allergies.    Social History:  The patient  reports that she quit  smoking about 1 years ago. She has a 10.00 pack-year smoking history. She has quit using smokeless tobacco. She reports that she does not drink alcohol or use drugs.   Family History:  The patient's family history includes Alzheimer's disease in her mother; Asthma in her brother; Congestive Heart Failure in her father; Heart failure in her father; Hypertension in her father.    ROS:  General:no colds or fevers, no weight changes CV:see HPI PUL:see HPI GI:no diarrhea constipation or melena, no indigestion Neuro:no syncope, no lightheadedness   Wt Readings from Last 3 Encounters:  03/09/17 171 lb 12.8 oz (77.9 kg)  02/23/17 172 lb 6.4 oz (78.2 kg)  02/20/17 173 lb (78.5 kg)     PHYSICAL EXAM: VS:  BP 120/76   Pulse 79   Ht  (1.651 m)   Wt 171 lb 12.8 oz (77.9 kg)   LMP 02/11/2017   SpO2 97%   BMI 28.59 kg/m  , BMI Body mass index is 28.59 kg/m. General:Pleasant affect, NAD Skin:Warm and dry, brisk capillary refill Heart:S1S2 RRR without murmur, gallup, rub or click Lungs:clear without rales, rhonchi, or wheezes WUJ:WJXB, non tender, + BS, do not palpate liver spleen or masses Ext:no lower ext edema, 2+ pedal pulses, 2+ radial pulses Neuro:alert and oriented, MAE, follows commands, +  facial symmetry    EKG:  EKG is NOT ordered today.    Recent Labs: 01/20/2017: ALT 9; BUN 14; Creat 0.90; Hemoglobin 10.9; Platelets 331; Potassium 4.0; Sodium 140; TSH 0.41    Lipid Panel    Component Value Date/Time   CHOL 259 (H) 01/20/2017 1052   TRIG 85 01/20/2017 1052   HDL 48 (L) 01/20/2017 1052   CHOLHDL 5.4 (H) 01/20/2017 1052   VLDL 17 01/20/2017 1052   LDLCALC 194 (H) 01/20/2017 1052       Other studies Reviewed: Additional studies/ records that were reviewed today include: see echo above..   ASSESSMENT AND PLAN:  1.  Dilated cardiomyopathy adjusting Entresto, will check BMP today and if stable will increase Entrest and sent in new script.  Follow up in 2  weeks.   2. Chronic systolic HF euvolemic today.    Current medicines are reviewed with the patient today.  The patient Has no concerns regarding medicines.  The following changes have been made:  See above Labs/ tests ordered today include:see above  Disposition:   FU:  see above  Signed, Nada Boozer, NP  03/09/2017 4:44 PM    [facility] [address] [place], [place], [place]  [address]/ [address] [place], [place] Phone: [phone]; Fax: [phone]  [phone]

## 2017-03-09 NOTE — Patient Instructions (Addendum)
Medication Instructions:  Your physician recommends that you continue on your current medications as directed. Please refer to the Current Medication list given to you today.   Labwork: TODAY BMET  Testing/Procedures: NONE ORDERED  Follow-Up: 03/31/17 @ 3:30 WITH Nada Boozer, NP   Any Other Special Instructions Will Be Listed Below (If Applicable).     If you need a refill on your cardiac medications before your next appointment, please call your pharmacy.

## 2017-03-10 ENCOUNTER — Telehealth: Payer: Self-pay | Admitting: *Deleted

## 2017-03-10 LAB — BASIC METABOLIC PANEL
BUN / CREAT RATIO: 14 (ref 9–23)
BUN: 11 mg/dL (ref 6–24)
CALCIUM: 9.2 mg/dL (ref 8.7–10.2)
CO2: 23 mmol/L (ref 18–29)
Chloride: 100 mmol/L (ref 96–106)
Creatinine, Ser: 0.77 mg/dL (ref 0.57–1.00)
GFR calc Af Amer: 109 mL/min/{1.73_m2} (ref >59)
GFR calc non Af Amer: 94 mL/min/{1.73_m2} (ref >59)
GLUCOSE: 82 mg/dL (ref 65–99)
Potassium: 4 mmol/L (ref 3.5–5.2)
Sodium: 142 mmol/L (ref 134–144)

## 2017-03-10 MED ORDER — SACUBITRIL-VALSARTAN 97-103 MG PO TABS: 1.0000 | ORAL_TABLET | Freq: Two times a day (BID) | ORAL | 1 refills | Status: DC

## 2017-03-10 NOTE — Telephone Encounter (Signed)
-----   Message from Leone Brand, NP sent at 03/10/2017 10:23 AM EDT ----- Pt can double up on her Entresto ie 2 twice a day of 49/51mg  tab  and call in in new dose of 97/103 mg BID tab and keep appt with me.  Thanks.

## 2017-03-13 ENCOUNTER — Encounter: Payer: Self-pay | Admitting: Cardiology

## 2017-03-31 ENCOUNTER — Encounter (INDEPENDENT_AMBULATORY_CARE_PROVIDER_SITE_OTHER): Payer: Self-pay

## 2017-03-31 ENCOUNTER — Ambulatory Visit (INDEPENDENT_AMBULATORY_CARE_PROVIDER_SITE_OTHER): Payer: 59 | Admitting: Cardiology

## 2017-03-31 ENCOUNTER — Encounter: Payer: Self-pay | Admitting: Cardiology

## 2017-03-31 VITALS — BP 124/78 | HR 82 | Ht 65.0 in | Wt 180.0 lb

## 2017-03-31 DIAGNOSIS — Z5189 Encounter for other specified aftercare: Secondary | ICD-10-CM | POA: Diagnosis not present

## 2017-03-31 DIAGNOSIS — I10 Essential (primary) hypertension: Secondary | ICD-10-CM | POA: Diagnosis not present

## 2017-03-31 DIAGNOSIS — I5022 Chronic systolic (congestive) heart failure: Secondary | ICD-10-CM | POA: Diagnosis not present

## 2017-03-31 DIAGNOSIS — I420 Dilated cardiomyopathy: Secondary | ICD-10-CM | POA: Diagnosis not present

## 2017-03-31 MED FILL — CARVEDILOL 25 MG TABLET: 25 | 30 days supply | Qty: 60 | Fill #2

## 2017-03-31 NOTE — Progress Notes (Signed)
Cardiology Office Note   Date:  03/31/2017   ID:  Catherine Gross, DOB [birth year], MRN [id number]  PCP:  Lora Paula, MD  Cardiologist:  Dr. Anne Fu    Chief Complaint  Patient presents with  . Cardiomyopathy      History of Present Illness: Catherine Gross is a 46 y.o. female who presents for Entresto follow up.    She has a hx of cardiomyopathy with EF 35% this is new 05/2015.  Severe MR, and last echo with EF 35-40% 01/2017.  Diffuse hypokinesis.  G3DD. Mod AVR severe MR, mod TR PA pk pressure was 55 mm hg.    Today on 97/103 BID of Entresto.  She has no complaints of chest pain or SOB.  No lightheadedness or dizziness.  BP is under control.  Her diet is good, and we discussed exercise if she could walk at least 10 min twice a day.  She will try.      Past Medical History:  Diagnosis Date  . Benign essential HTN   . Cardiomyopathy (HCC)   . Mitral regurgitation   . Preeclampsia    first pregnancy, not with susequent pregnancy    Past Surgical History:  Procedure Laterality Date  . CESAREAN SECTION       Current Outpatient Prescriptions  Medication Sig Dispense Refill  . carvedilol (COREG) 25 MG tablet Take 25 mg by mouth 2 (two) times daily with a meal.    . furosemide (LASIX) 40 MG tablet Take 1 tablet (40 mg total) by mouth daily. 30 tablet 5  . hydrALAZINE (APRESOLINE) 25 MG tablet Take 25 mg by mouth 3 (three) times daily.    . potassium chloride SA (K-DUR,KLOR-CON) 20 MEQ tablet Take 2 tablets (40 mEq total) by mouth daily. 180 tablet 1  . sacubitril-valsartan (ENTRESTO) 97-103 MG Take 1 tablet by mouth 2 (two) times daily. 60 tablet 1  . simvastatin (ZOCOR) 20 MG tablet Take 1 tablet (20 mg total) by mouth at bedtime. 30 tablet 2   No current facility-administered medications for this visit.     Allergies:   Patient has no known allergies.    Social History:  The patient  reports that she quit smoking about 2 years ago. She has a 10.00 pack-year  smoking history. She has quit using smokeless tobacco. She reports that she does not drink alcohol or use drugs.   Family History:  The patient's family history includes Alzheimer's disease in her mother; Asthma in her brother; Congestive Heart Failure in her father; Heart failure in her father; Hypertension in her father.    ROS:  General:no colds or fevers, no weight changes CV:see HPI PUL:see HPI GI:no diarrhea constipation or melena, no indigestion Neuro:no syncope, no lightheadedness  Wt Readings from Last 3 Encounters:  03/31/17 180 lb (81.6 kg)  03/09/17 171 lb 12.8 oz (77.9 kg)  02/23/17 172 lb 6.4 oz (78.2 kg)     PHYSICAL EXAM: VS:  BP 124/78   Pulse 82   Ht 5\' 5"  (1.651 m)   Wt 180 lb (81.6 kg)   BMI 29.95 kg/m  , BMI Body mass index is 29.95 kg/m. General:Pleasant affect, NAD Skin:Warm and dry, brisk capillary refill HEENT:normocephalic, sclera clear, mucus membranes moist Neck:supple, no JVD, no bruits  Heart:S1S2 RRR without murmur, gallup, rub or click Lungs:clear without rales, rhonchi, or wheezes WUJ:WJXB, non tender, + BS, do not palpate liver spleen or masses Neuro:alert and oriented, MAE, follows commands, + facial symmetry  EKG:  EKG is NOT ordered today.    Recent Labs: 01/20/2017: ALT 9; Hemoglobin 10.9; Platelets 331; TSH 0.41 03/09/2017: BUN 11; Creatinine, Ser 0.77; Potassium 4.0; Sodium 142    Lipid Panel    Component Value Date/Time   CHOL 259 (H) 01/20/2017 1052   TRIG 85 01/20/2017 1052   HDL 48 (L) 01/20/2017 1052   CHOLHDL 5.4 (H) 01/20/2017 1052   VLDL 17 01/20/2017 1052   LDLCALC 194 (H) 01/20/2017 1052       Other studies Reviewed: Additional studies/ records that were reviewed today include: .   ASSESSMENT AND PLAN:  1.  Dilated cardiomyopathy now on highest doses of BB, Entresto and on apresoline 3 times a day.  Will check BMET on higher dose.  She will follow up with Dr. Anne Fu in 8 weeks and possible echo at that  time.  2. HTN controlled  3.  Chronic systolic and diastolic HF now euvolemic.    Current medicines are reviewed with the patient today.  The patient Has no concerns regarding medicines.  The following changes have been made:  See above Labs/ tests ordered today include:see above  Disposition:   FU:  see above  Signed, Nada Boozer, NP  03/31/2017 3:59 PM    [facility] [address] [place], [place], [place]  [address]/ [address] [place], [place] Phone: [phone]; Fax: [phone]  [phone]

## 2017-03-31 NOTE — Patient Instructions (Addendum)
Lab work today ( bmet )    Appointment with Dr.Skains Friday 06/05/17 at 3:40 pm

## 2017-04-01 LAB — BASIC METABOLIC PANEL
BUN/Creatinine Ratio: 14 (ref 9–23)
BUN: 11 mg/dL (ref 6–24)
CHLORIDE: 102 mmol/L (ref 96–106)
CO2: 26 mmol/L (ref 18–29)
CREATININE: 0.76 mg/dL (ref 0.57–1.00)
Calcium: 9.2 mg/dL (ref 8.7–10.2)
GFR calc Af Amer: 110 mL/min/{1.73_m2} (ref >59)
GFR calc non Af Amer: 96 mL/min/{1.73_m2} (ref >59)
GLUCOSE: 100 mg/dL — AB (ref 65–99)
Potassium: 3.7 mmol/L (ref 3.5–5.2)
SODIUM: 143 mmol/L (ref 134–144)

## 2017-04-03 ENCOUNTER — Telehealth: Payer: Self-pay

## 2017-04-03 NOTE — Telephone Encounter (Signed)
Prior auth for Entresto 97-103mg  obtained from [employer] and is valid through 04/03/2018.

## 2017-04-06 ENCOUNTER — Telehealth: Payer: Self-pay

## 2017-04-06 ENCOUNTER — Telehealth: Payer: Self-pay | Admitting: *Deleted

## 2017-04-06 NOTE — Telephone Encounter (Signed)
She does and I spoke with her after I routed this message but she did not know what mail order pharmacy she was supposed to use. I informed her that she would need to look at her insurance card to see if it was listed there or call her insurance and ask which mail order pharmacy she is required to use. She was to call the office back to let us know where to send the rx.

## 2017-04-06 NOTE — Telephone Encounter (Signed)
Patient called and stated that her insurance said she would have to pay a 150$ copay for a 30 day supply of medication. She does not have the money to do that at this time and is wanting to know of other options.     HCC CMA AAMA

## 2017-04-06 NOTE — Telephone Encounter (Signed)
OK,Thank you for making [place] aware.  Does a RX need to be sent into a mail order pharmacy for the pt to receive the correct dose since she is unable to get it [facility]?

## 2017-04-06 NOTE — Telephone Encounter (Signed)
Pharmacist from [facility] called and stated that the patient is unable to get the entresto at any local pharmacy. Per her insurance she must get it from mail order. They are asking if the patient can get samples. I explained that we do not have samples of this dose and she requested that I give the patient samples of the 49/51 mg if possible. She is also going to ask that the patient call the office as well.

## 2017-04-21 MED FILL — hydrALAZINE HCL 25 MG TABS: 25 | 30 days supply | Qty: 120 | Fill #2

## 2017-04-21 MED FILL — ?FUROSEMIDE 40 MG TABLET: 40 | 30 days supply | Qty: 30 | Fill #2

## 2017-04-22 ENCOUNTER — Encounter: Payer: Self-pay | Admitting: Family Medicine

## 2017-05-15 ENCOUNTER — Encounter: Payer: Self-pay | Admitting: Cardiology

## 2017-05-21 ENCOUNTER — Encounter: Payer: Self-pay | Admitting: Cardiology

## 2017-05-25 ENCOUNTER — Ambulatory Visit: Payer: 59 | Admitting: Family Medicine

## 2017-05-25 MED FILL — CARVEDILOL 25 MG TABLET: 25 | 30 days supply | Qty: 60 | Fill #3

## 2017-05-25 MED FILL — POTASSIUM CL ER 20 MEQ TABL: 20 MEQ | 30 days supply | Qty: 60 | Fill #2

## 2017-05-26 ENCOUNTER — Ambulatory Visit: Payer: 59 | Attending: Physician Assistant | Admitting: Physician Assistant

## 2017-05-26 VITALS — BP 150/94 | HR 96 | Temp 98.4°F | Resp 16 | Wt 183.2 lb

## 2017-05-26 DIAGNOSIS — I10 Essential (primary) hypertension: Secondary | ICD-10-CM | POA: Insufficient documentation

## 2017-05-26 DIAGNOSIS — E7800 Pure hypercholesterolemia, unspecified: Secondary | ICD-10-CM | POA: Insufficient documentation

## 2017-05-26 DIAGNOSIS — Z79899 Other long term (current) drug therapy: Secondary | ICD-10-CM | POA: Insufficient documentation

## 2017-05-26 DIAGNOSIS — I340 Nonrheumatic mitral (valve) insufficiency: Secondary | ICD-10-CM | POA: Diagnosis not present

## 2017-05-26 DIAGNOSIS — I420 Dilated cardiomyopathy: Secondary | ICD-10-CM | POA: Insufficient documentation

## 2017-05-26 MED ORDER — CARVEDILOL 25 MG PO TABS: 25.0000 mg | ORAL_TABLET | Freq: Two times a day (BID) | ORAL | 5 refills | Status: DC

## 2017-05-26 MED ORDER — HYDRALAZINE HCL 25 MG PO TABS: 25.0000 mg | ORAL_TABLET | Freq: Three times a day (TID) | ORAL | 5 refills | Status: DC

## 2017-05-26 MED ORDER — SIMVASTATIN 20 MG PO TABS: 20.0000 mg | ORAL_TABLET | Freq: Every day | ORAL | 2 refills | Status: DC

## 2017-05-26 MED ORDER — LOSARTAN POTASSIUM 100 MG PO TABS: 100.0000 mg | ORAL_TABLET | Freq: Every day | ORAL | 3 refills | Status: DC

## 2017-05-26 MED ORDER — POTASSIUM CHLORIDE CRYS ER 20 MEQ PO TBCR: 40.0000 meq | EXTENDED_RELEASE_TABLET | Freq: Every day | ORAL | 1 refills | Status: DC

## 2017-05-26 MED ORDER — FUROSEMIDE 40 MG PO TABS: 40.0000 mg | ORAL_TABLET | Freq: Every day | ORAL | 5 refills | Status: DC

## 2017-05-26 MED FILL — FUROSEMIDE 40 MG TABLET: 40 | 30 days supply | Qty: 30 | Fill #0

## 2017-05-26 MED FILL — SIMVASTATIN 20 MG TABLET: 20 | 30 days supply | Qty: 30 | Fill #0

## 2017-05-26 MED FILL — hydrALAZINE HCL 25 MG TABS: 25 | 30 days supply | Qty: 90 | Fill #0

## 2017-05-26 MED FILL — LOSARTAN POTASSIUM 100 MG T: 100 | 30 days supply | Qty: 30 | Fill #0

## 2017-05-26 NOTE — Progress Notes (Signed)
Catherine Gross, is a 46 y.o. female  ZOX:[id number]  WJX:[id number]  DOB - December 23, 1970  Subjective:  Chief Complaint and HPI: Catherine Gross is a 46 y.o. female here today for BP check.  She has been out of her meds for a few days and hasn't takne any meds today.  She has an appt with cardiology on 06/05/2017.  She has not been taking Entresto because she can't afford it.  She did have some Losartan at home, and she was taking it until about 1 week ago when she ran out.  Prior to 1 week ago, when she would take her BP OOO it was about 120s/80s and controlled.  She denies CP/SOB/HA.  Last labs about 8 weeks ago.    ROS:   Constitutional:  No f/c, No night sweats, No unexplained weight loss. EENT:  No vision changes, No blurry vision, No hearing changes. No mouth, throat, or ear problems.  Respiratory: No cough, No SOB Cardiac: No CP, no palpitations GI:  No abd pain, No N/V/D. GU: No Urinary s/sx Musculoskeletal: No joint pain Neuro: No headache, no dizziness, no motor weakness.  Skin: No rash Endocrine:  No polydipsia. No polyuria.  Psych: Denies SI/HI  No problems updated.  ALLERGIES: No Known Allergies  PAST MEDICAL HISTORY: Past Medical History:  Diagnosis Date  . Benign essential HTN   . Cardiomyopathy (HCC)   . Mitral regurgitation   . Preeclampsia    first pregnancy, not with susequent pregnancy    MEDICATIONS AT HOME: Prior to Admission medications   Medication Sig Start Date End Date Taking? Authorizing Provider  carvedilol (COREG) 25 MG tablet Take 1 tablet (25 mg total) by mouth 2 (two) times daily with a meal. 05/26/17  Yes McClung, Angela M, PA-C  furosemide (LASIX) 40 MG tablet Take 1 tablet (40 mg total) by mouth daily. 05/26/17 11/22/17 Yes Anders Simmonds, PA-C  hydrALAZINE (APRESOLINE) 25 MG tablet Take 1 tablet (25 mg total) by mouth 3 (three) times daily. 05/26/17  Yes Georgian Co M, PA-C  potassium chloride SA (K-DUR,KLOR-CON) 20 MEQ tablet Take 2  tablets (40 mEq total) by mouth daily. 05/26/17 11/22/17 Yes McClung, Marzella Schlein, PA-C  simvastatin (ZOCOR) 20 MG tablet Take 1 tablet (20 mg total) by mouth at bedtime. 05/26/17  Yes Georgian Co M, PA-C  losartan (COZAAR) 100 MG tablet Take 1 tablet (100 mg total) by mouth daily. 05/26/17   Anders Simmonds, PA-C     Objective:  EXAM:   Vitals:   05/26/17 0910  BP: (!) 150/94  Pulse: 96  Resp: 16  Temp: 98.4 F (36.9 C)  TempSrc: Oral  SpO2: 96%  Weight: 183 lb 3.2 oz (83.1 kg)    General appearance : A&OX3. NAD. Non-toxic-appearing HEENT: Atraumatic and Normocephalic.  PERRLA. EOM intact.  Neck: supple, no JVD. No cervical lymphadenopathy. No thyromegaly Chest/Lungs:  Breathing-non-labored, Good air entry bilaterally, breath sounds normal without rales, rhonchi, or wheezing  CVS: S1 S2 regular, no murmurs, gallops, rubs  Extremities: Bilateral Lower Ext shows no edema, both legs are warm to touch with = pulse throughout Neurology:  CN II-XII grossly intact, Non focal.   Psych:  TP linear. J/I WNL. Normal speech. Appropriate eye contact and affect.  Skin:  No Rash  Data Review No results found for: HGBA1C   Assessment & Plan   1. Accelerated hypertension Uncontrolled today but out of meds- Cardiology had placed her on Entresto which would be preferred; however, patient is unable  to afford $150/month-this being considered, I am going to restart Losartan as her BP OOO were controlled on this along with her other medications- restart- losartan (COZAAR) 100 MG tablet; Take 1 tablet (100 mg total) by mouth daily.  Dispense: 90 tablet; Refill: 3 - potassium chloride SA (K-DUR,KLOR-CON) 20 MEQ tablet; Take 2 tablets (40 mEq total) by mouth daily.  Dispense: 180 tablet; Refill: 1 - hydrALAZINE (APRESOLINE) 25 MG tablet; Take 1 tablet (25 mg total) by mouth 3 (three) times daily.  Dispense: 90 tablet; Refill: 5  2. Hypercholesterolemia - simvastatin (ZOCOR) 20 MG tablet; Take 1  tablet (20 mg total) by mouth at bedtime.  Dispense: 30 tablet; Refill: 2  3. Dilated cardiomyopathy (HCC) Keep f/up appt with cardiology 06/05/2017-will defer labs to that appt as patient is not fasting today.  - carvedilol (COREG) 25 MG tablet; Take 1 tablet (25 mg total) by mouth 2 (two) times daily with a meal.  Dispense: 60 tablet; Refill: 5 - furosemide (LASIX) 40 MG tablet; Take 1 tablet (40 mg total) by mouth daily.  Dispense: 30 tablet; Refill: 5  Patient have been counseled extensively about nutrition and exercise  Return in about 4 months (around 09/25/2017) for assign new PCP; f/up htn.  The patient was given clear instructions to go to ER or return to medical center if symptoms don't improve, worsen or new problems develop. The patient verbalized understanding. The patient was told to call to get lab results if they haven't heard anything in the next week.     Georgian Co, PA-C [facility] [place], [place] [phone]   05/26/2017, 9:32 AMPatient ID: Catherine Gross, female   DOB: March 30, 1971, 46 y.o.   MRN: [id number]

## 2017-06-05 ENCOUNTER — Ambulatory Visit: Payer: 59 | Admitting: Cardiology

## 2017-06-15 ENCOUNTER — Ambulatory Visit (INDEPENDENT_AMBULATORY_CARE_PROVIDER_SITE_OTHER): Payer: 59 | Admitting: Nurse Practitioner

## 2017-06-15 ENCOUNTER — Encounter: Payer: Self-pay | Admitting: Nurse Practitioner

## 2017-06-15 VITALS — BP 148/88 | HR 94 | Ht 65.0 in | Wt 185.1 lb

## 2017-06-15 DIAGNOSIS — I340 Nonrheumatic mitral (valve) insufficiency: Secondary | ICD-10-CM

## 2017-06-15 DIAGNOSIS — I420 Dilated cardiomyopathy: Secondary | ICD-10-CM | POA: Diagnosis not present

## 2017-06-15 DIAGNOSIS — I5022 Chronic systolic (congestive) heart failure: Secondary | ICD-10-CM

## 2017-06-15 DIAGNOSIS — R825 Elevated urine levels of drugs, medicaments and biological substances: Secondary | ICD-10-CM

## 2017-06-15 NOTE — Progress Notes (Addendum)
CARDIOLOGY OFFICE NOTE  Date:  06/15/2017    Catherine Gross Date of Birth: 10/03/1971 Medical Record #[id number]  PCP:  Dessa Phi, MD  Cardiologist:  [facility]   Chief Complaint  Patient presents with  . Cardiomyopathy    Follow up visit - seen for Dr. Anne Fu    History of Present Illness: Catherine Gross is a 46 y.o. female who presents today for a follow up visit. Seen for Dr. Anne Fu.   She has a history of has a hx of HTN, cardiomyopathy with EF 35% - since 05/2015. Severe MR, and last echo with EF 35-40% back in 01/2017.  Diffuse hypokinesis.  G3DD. Mod AVR severe MR, mod TR PA pk pressure was 55 mm hg.   Her evaluation back in 2016 revealed elevated metanephrine levels - was to be worked up for a pheochromocytoma with outpatient endocrine referral. She saw Dr. Everardo All and he was going to repeat her 24 hour urine - however, she had to wait til she got her Medicaid - the test cost over 400 dollars and she was not able to afford. Does not look like this was ever done.   Saw Dr. Anne Fu back in March of this year - then seen back in April twice by Nada Boozer, NP - titrating Sherryll Burger. Felt to be stable clinically. Volume status ok. She was not able to afford the [facility] - so she is back to [facility] therapy.   Comes in today. Here alone. She says she is doing well. She is actually working 2 jobs - trying to get better insurance if she can get on full time with her part time job. She has no chest pain. Breathing is good. Not smoking. Not dizzy. Not able to afford Entresto due to the high co pay. She really has no concerns today.   Past Medical History:  Diagnosis Date  . Benign essential HTN   . Cardiomyopathy (HCC)   . Mitral regurgitation   . Preeclampsia    first pregnancy, not with susequent pregnancy    Past Surgical History:  Procedure Laterality Date  . CESAREAN SECTION       Medications: Current Meds  Medication Sig  . carvedilol (COREG) 25 MG tablet Take 1 tablet  (25 mg total) by mouth 2 (two) times daily with a meal.  . furosemide (LASIX) 40 MG tablet Take 1 tablet (40 mg total) by mouth daily.  . hydrALAZINE (APRESOLINE) 25 MG tablet Take 1 tablet (25 mg total) by mouth 3 (three) times daily.  Marland Kitchen losartan (COZAAR) 100 MG tablet Take 1 tablet (100 mg total) by mouth daily.  . potassium chloride SA (K-DUR,KLOR-CON) 20 MEQ tablet Take 2 tablets (40 mEq total) by mouth daily.  . simvastatin (ZOCOR) 20 MG tablet Take 1 tablet (20 mg total) by mouth at bedtime.     Allergies: No Known Allergies  Social History: The patient  reports that she quit smoking about 2 years ago. She has a 10.00 pack-year smoking history. She has quit using smokeless tobacco. She reports that she does not drink alcohol or use drugs.   Family History: The patient's family history includes Alzheimer's disease in her mother; Asthma in her brother; Congestive Heart Failure in her father; Heart failure in her father; Hypertension in her father.   Review of Systems: Please see the history of present illness.   Otherwise, the review of systems is positive for none.   All other systems are reviewed and negative.   Physical  Exam: VS:  BP (!) 148/88 (BP Location: Left Arm, Patient Position: Sitting, Cuff Size: Large)   Pulse 94   Ht 5\' 5"  (1.651 m)   Wt 185 lb 1.9 oz (84 kg)   SpO2 99% Comment: at rest  BMI 30.81 kg/m  .  BMI Body mass index is 30.81 kg/m.  Wt Readings from Last 3 Encounters:  06/15/17 185 lb 1.9 oz (84 kg)  05/26/17 183 lb 3.2 oz (83.1 kg)  03/31/17 180 lb (81.6 kg)    General: Pleasant. Well developed, well nourished and in no acute distress. Her weight is up 12 pounds since March of 2018.   HEENT: Normal.  Neck: Supple, no JVD, carotid bruits, or masses noted.  Cardiac: Regular rate and rhythm. No murmurs, rubs, or gallops. No edema.  Respiratory:  Lungs are clear to auscultation bilaterally with normal work of breathing.  GI: Soft and nontender.  MS:  No deformity or atrophy. Gait and ROM intact.  Skin: Warm and dry. Color is normal.  Neuro:  Strength and sensation are intact and no gross focal deficits noted.  Psych: Alert, appropriate and with normal affect.   LABORATORY DATA:  EKG:  EKG is not ordered today.  Lab Results  Component Value Date   WBC 4.4 01/20/2017   HGB 10.9 (L) 01/20/2017   HCT 35.6 01/20/2017   PLT 331 01/20/2017   GLUCOSE 100 (H) 03/31/2017   CHOL 259 (H) 01/20/2017   TRIG 85 01/20/2017   HDL 48 (L) 01/20/2017   LDLCALC 194 (H) 01/20/2017   ALT 9 01/20/2017   AST 12 01/20/2017   NA 143 03/31/2017   K 3.7 03/31/2017   CL 102 03/31/2017   CREATININE 0.76 03/31/2017   BUN 11 03/31/2017   CO2 26 03/31/2017   TSH 0.41 01/20/2017   INR 1.03 03/22/2015     BNP (last 3 results) No results for input(s): BNP in the last 8760 hours.  ProBNP (last 3 results) No results for input(s): PROBNP in the last 8760 hours.   Other Studies Reviewed Today:  Echo Study Conclusions from 01/2017  - Left ventricle: The cavity size was moderately dilated. Systolic   function was moderately reduced. The estimated ejection fraction   was in the range of 35% to 40%. Diffuse hypokinesis. Doppler   parameters are consistent with a reversible restrictive pattern,   indicative of decreased left ventricular diastolic compliance   and/or increased left atrial pressure (grade 3 diastolic   dysfunction). - Aortic valve: Transvalvular velocity was within the normal range.   There was no stenosis. There was moderate regurgitation. - Mitral valve: Moderately calcified annulus. Transvalvular   velocity was within the normal range. There was no evidence for   stenosis. There was severe regurgitation. - Left atrium: The atrium was severely dilated. - Right ventricle: The cavity size was normal. Wall thickness was   normal. Systolic function was normal. - Atrial septum: No defect or patent foramen ovale was identified   by  color flow Doppler. - Tricuspid valve: There was moderate regurgitation. - Pulmonic valve: There was moderate regurgitation. - Pulmonary arteries: Systolic pressure was moderately increased.   PA peak pressure: 55 mm Hg (S).   Assessment/Plan:  1.  Dilated cardiomyopathy - I am assuming this is non ischemic. Probably due to combination of HTN and valvular heart disease - she is on target therapy - but still with lots of BP and HR despite being on target therapy - I think she may  possibly need cardiac MRI - ? need referral for ICD. We need to get this abnormal 24 hour urine addressed as well. Probably should be followed in the CHF/structural heart clinic as well. Would like to discuss with Dr. Excell Seltzer and Dr. Gala Romney  2. HTN - not ideally controlled  3. Chronic systolic and diastolic HF now euvolemic. She has no symptoms.   4. Past history of elevated catecholamines/metanephrines on 24 hour urine - this issue never got worked up - she can get her labs for free now with [employer]. Will start with this issue and then go from there.   5. Severe MR   Current medicines are reviewed with the patient today.  The patient does not have concerns regarding medicines other than what has been noted above.  The following changes have been made:  See above.  Labs/ tests ordered today include:   No orders of the defined types were placed in this encounter.    Disposition:   FU with me in 4 to 6 weeks. May need further testing between now and then and she is aware.   Patient is agreeable to this plan and will call if any problems develop in the interim.   SignedNorma Fredrickson, NP  06/15/2017 8:20 AM  [facility] [address] [place], [place]  [address] Phone: [phone] Fax: [phone]      Addendum: Reviewed case with Dr. Excell Seltzer here in the office 06/17/17 He has advised proceeding with cardiac MRI. Would favor referral to CHF as well  due to her age/issues. Will discuss that further with her when I see her back. Cardiac MRI to be arranged.   Rosalio Macadamia, RN, ANP-C [facility] [address] [place], [place]  [address] [phone]

## 2017-06-15 NOTE — Patient Instructions (Addendum)
We will be checking the following labs today - BMET  Let's get your 24 hour urine for catecholamines and metanephrine's repeated   Medication Instructions:    Continue with your current medicines.     Testing/Procedures To Be Arranged:  N/A  Follow-Up:   See me back in about 4 to 6 weeks - we may end up doing further testing between now and the time you come back - we will be in touch.     Other Special Instructions:   N/A    If you need a refill on your cardiac medications before your next appointment, please call your pharmacy.   Call the [facility] office at [phone] if you have any questions, problems or concerns.

## 2017-06-17 ENCOUNTER — Other Ambulatory Visit: Payer: Self-pay | Admitting: Nurse Practitioner

## 2017-06-17 ENCOUNTER — Telehealth: Payer: Self-pay | Admitting: *Deleted

## 2017-06-17 DIAGNOSIS — I429 Cardiomyopathy, unspecified: Secondary | ICD-10-CM

## 2017-06-17 NOTE — Telephone Encounter (Signed)
Pt is aware schedulers will call pt to set up cardiac MRI.

## 2017-06-17 NOTE — Telephone Encounter (Signed)
-----   Message from Lori C Gerhardt, NP sent at 06/17/2017  9:39 AM EDT ----- Please call and let her know that I talked with Dr. Cooper about her situation. He has advised having cardiac MRI.   Please arrange.  lori 

## 2017-06-17 NOTE — Telephone Encounter (Signed)
-----   Message from Rosalio Macadamia, NP sent at 06/17/2017  9:39 AM EDT ----- Please call and let her know that I talked with Dr. Excell Seltzer about her situation. He has advised having cardiac MRI.   Please arrange.  Catherine Gross

## 2017-06-17 NOTE — Telephone Encounter (Signed)
Follow up ° ° °Pt is returning call to nurse.  °

## 2017-06-17 NOTE — Telephone Encounter (Signed)
Left message on machine for pt to contact the office.   

## 2017-06-22 ENCOUNTER — Telehealth: Payer: Self-pay | Admitting: Nurse Practitioner

## 2017-06-22 ENCOUNTER — Encounter: Payer: Self-pay | Admitting: Nurse Practitioner

## 2017-06-22 NOTE — Telephone Encounter (Signed)
Called patient and gave her date, time and location of cardiac MRI.  Letter mailed to the patient today.  Message sent to the nurse.

## 2017-07-03 ENCOUNTER — Ambulatory Visit (HOSPITAL_COMMUNITY)
Admission: RE | Admit: 2017-07-03 | Discharge: 2017-07-03 | Disposition: A | Discharge status: Home / Self Care | Payer: 59 | Source: Ambulatory Visit | Attending: Nurse Practitioner | Admitting: Nurse Practitioner

## 2017-07-03 DIAGNOSIS — I429 Cardiomyopathy, unspecified: Secondary | ICD-10-CM | POA: Diagnosis not present

## 2017-07-03 DIAGNOSIS — I340 Nonrheumatic mitral (valve) insufficiency: Secondary | ICD-10-CM | POA: Insufficient documentation

## 2017-07-03 LAB — CREATININE, SERUM
Creatinine, Ser: 0.83 mg/dL (ref 0.44–1.00)
GFR calc Af Amer: >60 mL/min (ref >60)
GFR calc non Af Amer: >60 mL/min (ref >60)

## 2017-07-03 MED ORDER — GADOBENATE DIMEGLUMINE 529 MG/ML IV SOLN
40.0000 mL | Freq: Once | INTRAVENOUS | Status: AC
  Administered 2017-07-03: 38 mL via INTRAVENOUS

## 2017-07-15 ENCOUNTER — Ambulatory Visit (INDEPENDENT_AMBULATORY_CARE_PROVIDER_SITE_OTHER): Payer: 59 | Admitting: Nurse Practitioner

## 2017-07-15 ENCOUNTER — Encounter (INDEPENDENT_AMBULATORY_CARE_PROVIDER_SITE_OTHER): Payer: Self-pay

## 2017-07-15 ENCOUNTER — Encounter: Payer: Self-pay | Admitting: Nurse Practitioner

## 2017-07-15 VITALS — BP 148/90 | HR 95 | Ht 65.0 in | Wt 185.0 lb

## 2017-07-15 DIAGNOSIS — I5022 Chronic systolic (congestive) heart failure: Secondary | ICD-10-CM

## 2017-07-15 DIAGNOSIS — E7800 Pure hypercholesterolemia, unspecified: Secondary | ICD-10-CM | POA: Diagnosis not present

## 2017-07-15 DIAGNOSIS — I340 Nonrheumatic mitral (valve) insufficiency: Secondary | ICD-10-CM

## 2017-07-15 DIAGNOSIS — R825 Elevated urine levels of drugs, medicaments and biological substances: Secondary | ICD-10-CM | POA: Diagnosis not present

## 2017-07-15 NOTE — Progress Notes (Signed)
CARDIOLOGY OFFICE NOTE  Date:  07/15/2017    Catherine Gross Date of Birth: 1971/03/15 Medical Record #[id number]  PCP:  Dessa Phi, MD  Cardiologist:  [facility]   Chief Complaint  Patient presents with  . Cardiomyopathy    Follow up visit - seen for Dr. Anne Fu    History of Present Illness: Catherine Gross is a 46 y.o. female who presents today for a one month check. Seen for Dr. Anne Fu.   She has a history of has a hx of HTN, cardiomyopathy with EF 35% - since 05/2015. Severe MR, and last echo with EF 35-40% back in 01/2017. Diffuse hypokinesis. G3DD. Mod AVR severe MR, mod TR PA pk pressure was 55 mm hg.   Her evaluation back in 2016 revealed elevated metanephrine levels - was to be worked up for a pheochromocytoma with outpatient endocrine referral. She saw Dr. Everardo All and he was going to repeat her 24 hour urine - however, she had to wait til she got her Medicaid - the test cost over 400 dollars and she was not able to afford. Does not look like this was ever done.   Saw Dr. Anne Fu back in March of this year - then seen back in April twice by Nada Boozer, NP - titrating Sherryll Burger. Felt to be stable clinically. Volume status ok. She was not able to afford the [facility] - so she is back to [facility] therapy.   I then saw her last month - was doing well - has insurance but still not able to afford Entresto due to the high copay. Got her 24 hour urine updated - unfortunately, they only ran one of the tests and not both. Discussed her situation with Dr. Excell Seltzer as well - sent for cardiac MRI - to consider one time CHF referral.   Comes in today. Here alone. She is doing well. Working overtime with [employer]. She feels good. No chest pain. Breathing is good. BP up some but she has not had her medicines yet today.  She really has no complaints.   Past Medical History:  Diagnosis Date  . Benign essential HTN   . Cardiomyopathy (HCC)   . Mitral regurgitation   . Preeclampsia    first  pregnancy, not with susequent pregnancy    Past Surgical History:  Procedure Laterality Date  . CESAREAN SECTION       Medications: Current Meds  Medication Sig  . carvedilol (COREG) 25 MG tablet Take 1 tablet (25 mg total) by mouth 2 (two) times daily with a meal.  . furosemide (LASIX) 40 MG tablet Take 1 tablet (40 mg total) by mouth daily.  . hydrALAZINE (APRESOLINE) 25 MG tablet Take 1 tablet (25 mg total) by mouth 3 (three) times daily.  Marland Kitchen losartan (COZAAR) 100 MG tablet Take 1 tablet (100 mg total) by mouth daily.  . potassium chloride SA (K-DUR,KLOR-CON) 20 MEQ tablet Take 2 tablets (40 mEq total) by mouth daily.  . simvastatin (ZOCOR) 20 MG tablet Take 1 tablet (20 mg total) by mouth at bedtime.     Allergies: No Known Allergies  Social History: The patient  reports that she quit smoking about 2 years ago. She has a 10.00 pack-year smoking history. She has quit using smokeless tobacco. She reports that she does not drink alcohol or use drugs.   Family History: The patient's family history includes Alzheimer's disease in her mother; Asthma in her brother; Congestive Heart Failure in her father; Heart failure in her father;  Hypertension in her father.   Review of Systems: Please see the history of present illness.   Otherwise, the review of systems is positive for none.   All other systems are reviewed and negative.   Physical Exam: VS:  BP (!) 148/90   Pulse 95   Ht 5\' 5"  (1.651 m)   Wt 185 lb (83.9 kg)   LMP 06/27/2017   SpO2 97%   BMI 30.79 kg/m  .  BMI Body mass index is 30.79 kg/m.  Wt Readings from Last 3 Encounters:  07/15/17 185 lb (83.9 kg)  06/15/17 185 lb 1.9 oz (84 kg)  05/26/17 183 lb 3.2 oz (83.1 kg)    General: Pleasant. Well developed, well nourished and in no acute distress.   HEENT: Normal.  Neck: Supple, no JVD, carotid bruits, or masses noted.  Cardiac: Regular rate and rhythm. Soft systolic murmur noted. No edema.  Respiratory:  Lungs  are clear to auscultation bilaterally with normal work of breathing.  GI: Soft and nontender.  MS: No deformity or atrophy. Gait and ROM intact.  Skin: Warm and dry. Color is normal.  Neuro:  Strength and sensation are intact and no gross focal deficits noted.  Psych: Alert, appropriate and with normal affect.   LABORATORY DATA:  EKG:  EKG is not ordered today.  Lab Results  Component Value Date   WBC 4.4 01/20/2017   HGB 10.9 (L) 01/20/2017   HCT 35.6 01/20/2017   PLT 331 01/20/2017   GLUCOSE 100 (H) 03/31/2017   CHOL 259 (H) 01/20/2017   TRIG 85 01/20/2017   HDL 48 (L) 01/20/2017   LDLCALC 194 (H) 01/20/2017   ALT 9 01/20/2017   AST 12 01/20/2017   NA 143 03/31/2017   K 3.7 03/31/2017   CL 102 03/31/2017   CREATININE 0.83 07/03/2017   BUN 11 03/31/2017   CO2 26 03/31/2017   TSH 0.41 01/20/2017   INR 1.03 03/22/2015     BNP (last 3 results) No results for input(s): BNP in the last 8760 hours.  ProBNP (last 3 results) No results for input(s): PROBNP in the last 8760 hours.   Other Studies Reviewed Today:  Cardiac MRI IMPRESSION 06/2017:  1. Mild LV dilation with mild LV hypertrophy. EF 44%, diffuse mild hypokinesis.  2.  Normal RV size and systolic function.  3. Mitral regurgitation appears severe and central. The valve does not appear thickened.  4. On delayed enhancement imaging, there was no apparent LGE. Therefore, no definitive evidence for prior MI, myocarditis, or infiltrative disease. Suspect nonischemic cardiomyopathy.  Dalton Mclean   Electronically Signed   By: Marca Ancona M.D.   On: 07/03/2017 14:22   Echo Study Conclusions from 01/2017  - Left ventricle: The cavity size was moderately dilated. Systolic function was moderately reduced. The estimated ejection fraction was in the range of 35% to 40%. Diffuse hypokinesis. Doppler parameters are consistent with a reversible restrictive pattern, indicative of decreased  left ventricular diastolic compliance and/or increased left atrial pressure (grade 3 diastolic dysfunction). - Aortic valve: Transvalvular velocity was within the normal range. There was no stenosis. There was moderate regurgitation. - Mitral valve: Moderately calcified annulus. Transvalvular velocity was within the normal range. There was no evidence for stenosis. There was severe regurgitation. - Left atrium: The atrium was severely dilated. - Right ventricle: The cavity size was normal. Wall thickness was normal. Systolic function was normal. - Atrial septum: No defect or patent foramen ovale was identified by color flow  Doppler. - Tricuspid valve: There was moderate regurgitation. - Pulmonic valve: There was moderate regurgitation. - Pulmonary arteries: Systolic pressure was moderately increased. PA peak pressure: 55 mm Hg (S).   Assessment/Plan:  1. Dilated cardiomyopathy - Assumed to be non ischemic. Probably due to combination of HTN and valvular heart disease - she is on target therapy - but still with lots of BP and HR despite being on target therapy - she has had her cardiac MRI - EF at 44%  - severe MR noted. Will get her an evaluation with Dr. Shirlee Latch to see if other issues need addressing - tentatively see back in 4 months.   2. HTN - not ideally controlled - no medicines today - she is to monitor.   3. Chronic systolic and diastolic HF now euvolemic. She has no symptoms.   4. Past history of elevated catecholamines/metanephrines on 24 hour urine - repeating 24 hour urine - there was a mix up in the prior order - need to get the metanephrine portion completed.   5. Severe MR - referral to Dr. Shirlee Latch to see if R heart cath indicated - she is doing ok clinically but need to worry about long term prognosis.   Current medicines are reviewed with the patient today.  The patient does not have concerns regarding medicines other than what has been noted  above.  The following changes have been made:  See above.  Labs/ tests ordered today include:    Orders Placed This Encounter  Procedures  . Metanephrines, Urine, 24 hour  . Ambulatory referral to Cardiology     Disposition:   FU with me tentatively in 4 months.    Patient is agreeable to this plan and will call if any problems develop in the interim.   SignedNorma Fredrickson, NP  07/15/2017 8:28 AM  [facility] [address] [place], [place]  [address] Phone: [phone] Fax: [phone]

## 2017-07-15 NOTE — Patient Instructions (Addendum)
We will be checking the following labs today - 24 hour urine for metanephrines   Medication Instructions:    Continue with your current medicines.     Testing/Procedures To Be Arranged:  N/A  Follow-Up:   See me in 4 months - unless Dr. Shirlee Latch says otherwise  Referral to CHF clinic    Other Special Instructions:   N/A    If you need a refill on your cardiac medications before your next appointment, please call your pharmacy.   Call the [facility] office at [phone] if you have any questions, problems or concerns.

## 2017-07-17 ENCOUNTER — Other Ambulatory Visit: Payer: Self-pay | Admitting: Nurse Practitioner

## 2017-07-28 LAB — METANEPHRINES, URINE, 24 HOUR
Metaneph Total, Ur: 87 ug/L
Metanephrines, 24H Ur: 104 ug/24 hr (ref 45–290)
Normetanephrine, 24H Ur: 234 ug/24 hr (ref 82–500)
Normetanephrine, Ur: 195 ug/L

## 2017-08-06 MED FILL — POTASSIUM CL ER 20 MEQ TABL: 20 MEQ | 30 days supply | Qty: 60 | Fill #3

## 2017-08-14 MED FILL — CARVEDILOL 25 MG TABLET: 25 | 30 days supply | Qty: 60 | Fill #4

## 2017-08-14 MED FILL — FUROSEMIDE 40 MG TABLET: 40 | 30 days supply | Qty: 30 | Fill #1

## 2017-08-14 MED FILL — hydrALAZINE HCL 25 MG TABS: 25 | 30 days supply | Qty: 90 | Fill #1

## 2017-08-14 MED FILL — LOSARTAN POTASSIUM 100 MG T: 100 | 30 days supply | Qty: 30 | Fill #1

## 2017-08-19 ENCOUNTER — Encounter (HOSPITAL_COMMUNITY): Payer: Self-pay | Admitting: Cardiology

## 2017-08-19 ENCOUNTER — Other Ambulatory Visit: Payer: Self-pay | Admitting: Pharmacist

## 2017-08-19 ENCOUNTER — Ambulatory Visit (HOSPITAL_COMMUNITY)
Admission: RE | Admit: 2017-08-19 | Discharge: 2017-08-19 | Disposition: A | Discharge status: Home / Self Care | Payer: 59 | Source: Ambulatory Visit | Attending: Cardiology | Admitting: Cardiology

## 2017-08-19 ENCOUNTER — Encounter (HOSPITAL_COMMUNITY): Payer: Self-pay

## 2017-08-19 VITALS — HR 91 | Ht 65.0 in | Wt 187.8 lb

## 2017-08-19 DIAGNOSIS — E785 Hyperlipidemia, unspecified: Secondary | ICD-10-CM | POA: Diagnosis not present

## 2017-08-19 DIAGNOSIS — I110 Hypertensive heart disease with heart failure: Secondary | ICD-10-CM | POA: Insufficient documentation

## 2017-08-19 DIAGNOSIS — Z87891 Personal history of nicotine dependence: Secondary | ICD-10-CM | POA: Diagnosis not present

## 2017-08-19 DIAGNOSIS — I5022 Chronic systolic (congestive) heart failure: Secondary | ICD-10-CM | POA: Diagnosis not present

## 2017-08-19 DIAGNOSIS — I10 Essential (primary) hypertension: Secondary | ICD-10-CM

## 2017-08-19 DIAGNOSIS — I340 Nonrheumatic mitral (valve) insufficiency: Secondary | ICD-10-CM | POA: Insufficient documentation

## 2017-08-19 DIAGNOSIS — Z79899 Other long term (current) drug therapy: Secondary | ICD-10-CM | POA: Diagnosis not present

## 2017-08-19 DIAGNOSIS — Z8249 Family history of ischemic heart disease and other diseases of the circulatory system: Secondary | ICD-10-CM | POA: Insufficient documentation

## 2017-08-19 DIAGNOSIS — E7800 Pure hypercholesterolemia, unspecified: Secondary | ICD-10-CM

## 2017-08-19 DIAGNOSIS — I420 Dilated cardiomyopathy: Secondary | ICD-10-CM

## 2017-08-19 MED ORDER — HYDRALAZINE HCL 25 MG PO TABS: 25.0000 mg | ORAL_TABLET | Freq: Three times a day (TID) | ORAL | 0 refills | Status: DC

## 2017-08-19 MED ORDER — LOSARTAN POTASSIUM 100 MG PO TABS: 100.0000 mg | ORAL_TABLET | Freq: Every day | ORAL | 0 refills | Status: DC

## 2017-08-19 MED ORDER — CARVEDILOL 25 MG PO TABS: 25.0000 mg | ORAL_TABLET | Freq: Two times a day (BID) | ORAL | 0 refills | Status: DC

## 2017-08-19 MED ORDER — SIMVASTATIN 20 MG PO TABS: 20.0000 mg | ORAL_TABLET | Freq: Every day | ORAL | 0 refills | Status: DC

## 2017-08-19 MED ORDER — SACUBITRIL-VALSARTAN 49-51 MG PO TABS: 1.0000 | ORAL_TABLET | Freq: Two times a day (BID) | ORAL | 11 refills | Status: DC

## 2017-08-19 NOTE — Patient Instructions (Signed)
STOP Losartan.  START Entresto 49/51 mg tablet twice daily.  You have been scheduled for a transesophageal echo and right heart cath on Thursday 08/27/2017.  Follow up 3-4 weeks with Dr. Shirlee Latch.  Take all medication as prescribed the day of your appointment. Bring all medications with you to your appointment.  Do the following things EVERYDAY: 1) Weigh yourself in the morning before breakfast. Write it down and keep it in a log. 2) Take your medicines as prescribed 3) Eat low salt foods-Limit salt (sodium) to 2000 mg per day.  4) Stay as active as you can everyday 5) Limit all fluids for the day to less than 2 liters

## 2017-08-20 ENCOUNTER — Other Ambulatory Visit (HOSPITAL_COMMUNITY): Payer: Self-pay

## 2017-08-20 DIAGNOSIS — R0600 Dyspnea, unspecified: Secondary | ICD-10-CM

## 2017-08-20 NOTE — Progress Notes (Signed)
PCP: Dr. Armen Pickup Referring: Norma Fredrickson HF Cardiology: Dr. Shirlee Latch  46 yo with history of cardiomyopathy of uncertain etiology, HTN, and severe mitral regurgitation was referred by Norma Fredrickson for evaluation of CHF.  She has had a history of HTN for about 5 years or so.  She had pre-eclampsia with a pregnancy.  In 2016, she had an echo showing EF 35% with diffuse hypokinesis and severe MR.  Workup for secondary hypertension at that time showed elevated urinary metanephrines.  There was concern for pheochromocytoma, but it appears that this was not followed up at the time.  She had a repeat echo in 2/18 with EF 35-40% and again, severe MR.  Cardiac MRI in 7/18 showed EF 44%, severe MR, and no myocardial late gadolinium enhancement.  She recently had repeat urinary metanephrines => this actually was normal (8/18).   BP is currently controlled on medications.  She is on hydralazine but is unable to take Imdur due to headaches.  She was on Entresto in the past but had trouble affording it and is back on losartan.  She is generally doing pretty well on current medical regimen.  She can walk about 1/2 mile before she tires.  Dyspnea with stairs and inclines.  No orthopnea/PND. No chest pain. No lightheadedness, syncope, palpitations.    ECG (3/18, personally reviewed): NSR,normal  Labs (4/16): Elevated urinary normetanephrine and total metanephrines.  Labs (5/16): HIV negative Labs (4/18): K 3.7, creatinine 0.76, LDL 194, TSH normal Labs (6/96): Normal urinary metanephrines  PMH: 1. HTN: Evaluation in 2016 was concerning for pheochromocytoma with elevated urinary metanephrines but no followup.  8/18 24 hour urine collection actually showed normal urinary metanephrines.  2. Hyperlipidemia 3. Chronic systolic CHF:  - Echo (4/16): EF 35%, mild LV dilation, mild LVH, mild AI, PASP 49 mmHg, severe MR.  - Echo (2/18): EF 35-40%, moderate LV dilation, diffuse hypokinesis, moderate AI, severe MR, normal RV  size and systolic function, moderate TR, moderate PI.  - Cardiac MRI (7/18): EF 44% with mild LV dilation, mild LVH, severe central MR, no late gadolinium enhancement noted.  4. Mitral regurgitation: Severe by echo and cardiac MRI, appears to be central. Etiology uncertain.   Social History   Social History  . Marital status: Single    Spouse name: N/A  . Number of children: N/A  . Years of education: N/A   Occupational History  . Not on file.   Social History Main Topics  . Smoking status: Former Smoker    Packs/day: 0.50    Years: 20.00    Quit date: 03/22/2015  . Smokeless tobacco: Former Neurosurgeon  . Alcohol use No  . Drug use: No  . Sexual activity: Not on file   Other Topics Concern  . Not on file   Social History Narrative  . No narrative on file   Family History  Problem Relation Age of Onset  . Heart failure Father   . Hypertension Father   . Congestive Heart Failure Father   . Alzheimer's disease Mother   . Asthma Brother    ROS: All systems reviewed and negative except as per HPI.   Current Outpatient Prescriptions  Medication Sig Dispense Refill  . carvedilol (COREG) 25 MG tablet Take 1 tablet (25 mg total) by mouth 2 (two) times daily with a meal. 180 tablet 0  . furosemide (LASIX) 40 MG tablet Take 1 tablet (40 mg total) by mouth daily. 30 tablet 5  . hydrALAZINE (APRESOLINE) 25 MG tablet  Take 1 tablet (25 mg total) by mouth 3 (three) times daily. 270 tablet 0  . potassium chloride SA (K-DUR,KLOR-CON) 20 MEQ tablet Take 2 tablets (40 mEq total) by mouth daily. 180 tablet 1  . simvastatin (ZOCOR) 20 MG tablet Take 1 tablet (20 mg total) by mouth at bedtime. 90 tablet 0  . sacubitril-valsartan (ENTRESTO) 49-51 MG Take 1 tablet by mouth 2 (two) times daily. 60 tablet 11   No current facility-administered medications for this encounter.    Pulse 91   Ht 5\' 5"  (1.651 m)   Wt 187 lb 12.8 oz (85.2 kg)   SpO2 100%   BMI 31.25 kg/m  General: NAD Neck: No JVD,  no thyromegaly or thyroid nodule.  Lungs: Clear to auscultation bilaterally with normal respiratory effort. CV: Nondisplaced PMI.  Heart regular S1/S2, +S4, 3/6 HSM apex.  No peripheral edema.  No carotid bruit.  Normal pedal pulses.  Abdomen: Soft, nontender, no hepatosplenomegaly, no distention.  Skin: Intact without lesions or rashes.  Neurologic: Alert and oriented x 3.  Psych: Normal affect. Extremities: No clubbing or cyanosis.  HEENT: Normal.   Assessment/Plan: 1. HTN: Long-standing.  BP is controlled on current regimen. Repeat workup for pheochromocytoma appears to be negative, but I will also send plasma metanephrines.  2. Chronic systolic CHF: EF 66-06% by 2/18 echo and 44% by 7/18 cMRI. No late gadolinium enhancement on MRI. Etiology uncertain.  It is possible that she could have a cardiomyopathy due to long-standing severe MR (was present on initial echo in 4/16), but so far, evaluation has not appeared to show structural problems with the MV so possibly the MR is functional and related to the cardiomyopathy. NYHA class II symptoms, she is not volume overloaded on exam.  - Would stop losartan and restart on Entresto 49/51 bid. I talked with our pharmacist and we should be able to get the medication for her at a reasonable price.  BMET today and repeat in 10 days.  - She is on hydralazine but cannot tolerate Imdur.  At next appt, I will likely stop hydralazine and put her on spironolactone.  - Continue Coreg.  - Continue current Lasix, volume status looks ok.   - I would like to do a TEE to more closely evaluate the mechanism and severity of her mitral regurgitation => could this have caused the cardiomyopathy or is it just a consequence (functional MR)?  - At the time of TEE, I will do a right and left heart cath (rule out CAD, assess filling pressures).  - I discussed risks/benefits of the procedures with her and she agrees to proceed.  3. Mitral regurgitation: Severe MR noted on  imaging back to 2016.  No definite functional abnormality of MV reported.  Possible functional MR due to cardiomyopathy, but cannot totally rule out MR as cause of cardiomyopathy. As above, plan to more closely evaluate cause of MR with TEE.   Marca Ancona 08/20/2017

## 2017-08-20 NOTE — Progress Notes (Signed)
Thank you for seeing her!  Catherine Gross

## 2017-08-21 ENCOUNTER — Other Ambulatory Visit (HOSPITAL_COMMUNITY): Payer: Self-pay | Admitting: Cardiology

## 2017-08-26 ENCOUNTER — Telehealth (HOSPITAL_COMMUNITY): Payer: Self-pay | Admitting: *Deleted

## 2017-08-26 NOTE — Telephone Encounter (Signed)
Patient called requesting to reschedule her TEE/RHC to next Wednesday 9/26.  TEE/RHC has been rescheduled to next wednesday.  Patient instructed to arrive at Admissions at 7:00AM.

## 2017-09-01 ENCOUNTER — Telehealth (HOSPITAL_COMMUNITY): Payer: Self-pay

## 2017-09-01 NOTE — Telephone Encounter (Signed)
Patient notified of rescheduling of tomorrow's procedures to earlier time. Aware and appreciative, understands all instructions and arrival time/location.  Ave Filter, RN

## 2017-09-02 ENCOUNTER — Encounter (HOSPITAL_COMMUNITY)
Admission: RE | Disposition: A | Discharge status: Home / Self Care | Payer: Self-pay | Source: Ambulatory Visit | Attending: Cardiology

## 2017-09-02 ENCOUNTER — Ambulatory Visit (HOSPITAL_COMMUNITY)
Admission: RE | Admit: 2017-09-02 | Discharge: 2017-09-02 | Disposition: A | Discharge status: Home / Self Care | Payer: 59 | Source: Ambulatory Visit | Attending: Cardiology | Admitting: Cardiology

## 2017-09-02 ENCOUNTER — Ambulatory Visit (HOSPITAL_BASED_OUTPATIENT_CLINIC_OR_DEPARTMENT_OTHER): Payer: 59

## 2017-09-02 ENCOUNTER — Encounter (HOSPITAL_COMMUNITY): Payer: Self-pay

## 2017-09-02 DIAGNOSIS — R0600 Dyspnea, unspecified: Secondary | ICD-10-CM

## 2017-09-02 DIAGNOSIS — Z87891 Personal history of nicotine dependence: Secondary | ICD-10-CM | POA: Insufficient documentation

## 2017-09-02 DIAGNOSIS — Z8249 Family history of ischemic heart disease and other diseases of the circulatory system: Secondary | ICD-10-CM | POA: Insufficient documentation

## 2017-09-02 DIAGNOSIS — I5022 Chronic systolic (congestive) heart failure: Secondary | ICD-10-CM | POA: Insufficient documentation

## 2017-09-02 DIAGNOSIS — E785 Hyperlipidemia, unspecified: Secondary | ICD-10-CM | POA: Insufficient documentation

## 2017-09-02 DIAGNOSIS — I340 Nonrheumatic mitral (valve) insufficiency: Secondary | ICD-10-CM | POA: Insufficient documentation

## 2017-09-02 DIAGNOSIS — I110 Hypertensive heart disease with heart failure: Secondary | ICD-10-CM | POA: Insufficient documentation

## 2017-09-02 DIAGNOSIS — I429 Cardiomyopathy, unspecified: Secondary | ICD-10-CM | POA: Insufficient documentation

## 2017-09-02 HISTORY — PX: TEE WITHOUT CARDIOVERSION: SHX5443

## 2017-09-02 HISTORY — PX: RIGHT/LEFT HEART CATH AND CORONARY ANGIOGRAPHY: CATH118266

## 2017-09-02 LAB — BASIC METABOLIC PANEL
Anion gap: 4 — ABNORMAL LOW (ref 5–15)
BUN: 6 mg/dL (ref 6–20)
CALCIUM: 8.9 mg/dL (ref 8.9–10.3)
CO2: 25 mmol/L (ref 22–32)
CREATININE: 0.74 mg/dL (ref 0.44–1.00)
Chloride: 110 mmol/L (ref 101–111)
Glucose, Bld: 83 mg/dL (ref 65–99)
Potassium: 3.6 mmol/L (ref 3.5–5.1)
SODIUM: 139 mmol/L (ref 135–145)

## 2017-09-02 LAB — POCT I-STAT 3, VENOUS BLOOD GAS (G3P V)
ACID-BASE DEFICIT: 1 mmol/L (ref 0.0–2.0)
BICARBONATE: 25.3 mmol/L (ref 20.0–28.0)
Bicarbonate: 24.8 mmol/L (ref 20.0–28.0)
O2 SAT: 66 %
O2 SAT: 68 %
TCO2: 26 mmol/L (ref 22–32)
TCO2: 27 mmol/L (ref 22–32)
pCO2, Ven: 44.5 mmHg (ref 44.0–60.0)
pCO2, Ven: 45 mmHg (ref 44.0–60.0)
pH, Ven: 7.353 (ref 7.250–7.430)
pH, Ven: 7.359 (ref 7.250–7.430)
pO2, Ven: 36 mmHg (ref 32.0–45.0)
pO2, Ven: 37 mmHg (ref 32.0–45.0)

## 2017-09-02 LAB — CBC
HEMATOCRIT: 35.7 % — AB (ref 36.0–46.0)
HEMOGLOBIN: 11.1 g/dL — AB (ref 12.0–15.0)
MCH: 26.8 pg (ref 26.0–34.0)
MCHC: 31.1 g/dL (ref 30.0–36.0)
MCV: 86.2 fL (ref 78.0–100.0)
Platelets: 253 10*3/uL (ref 150–400)
RBC: 4.14 MIL/uL (ref 3.87–5.11)
RDW: 16.3 % — ABNORMAL HIGH (ref 11.5–15.5)
WBC: 4.2 10*3/uL (ref 4.0–10.5)

## 2017-09-02 LAB — PROTIME-INR
INR: 0.98
PROTHROMBIN TIME: 12.9 s (ref 11.4–15.2)

## 2017-09-02 LAB — HCG, SERUM, QUALITATIVE: PREG SERUM: NEGATIVE

## 2017-09-02 SURGERY — RIGHT/LEFT HEART CATH AND CORONARY ANGIOGRAPHY
Anesthesia: LOCAL

## 2017-09-02 SURGERY — ECHOCARDIOGRAM, TRANSESOPHAGEAL
Anesthesia: Moderate Sedation

## 2017-09-02 MED ORDER — ASPIRIN 81 MG PO CHEW: 81.0000 mg | CHEWABLE_TABLET | ORAL | Status: DC

## 2017-09-02 MED ORDER — HEPARIN SODIUM (PORCINE) 1000 UNIT/ML IJ SOLN
INTRAMUSCULAR | Status: DC | PRN
  Administered 2017-09-02: 4500 [IU] via INTRAVENOUS

## 2017-09-02 MED ORDER — HEPARIN (PORCINE) IN NACL 2-0.9 UNIT/ML-% IJ SOLN
INTRAMUSCULAR | Status: AC
  Filled 2017-09-02: qty 1000

## 2017-09-02 MED ORDER — MIDAZOLAM HCL 10 MG/2ML IJ SOLN
INTRAMUSCULAR | Status: DC | PRN
  Administered 2017-09-02 (×3): 1 mg via INTRAVENOUS
  Administered 2017-09-02: 2 mg via INTRAVENOUS

## 2017-09-02 MED ORDER — FENTANYL CITRATE (PF) 100 MCG/2ML IJ SOLN
INTRAMUSCULAR | Status: AC
  Filled 2017-09-02: qty 2

## 2017-09-02 MED ORDER — ASPIRIN 81 MG PO CHEW
CHEWABLE_TABLET | ORAL | Status: DC | PRN
  Administered 2017-09-02: 324 mg via ORAL

## 2017-09-02 MED ORDER — MIDAZOLAM HCL 2 MG/2ML IJ SOLN
INTRAMUSCULAR | Status: AC
  Filled 2017-09-02: qty 2

## 2017-09-02 MED ORDER — LIDOCAINE HCL (PF) 1 % IJ SOLN
INTRAMUSCULAR | Status: DC | PRN
  Administered 2017-09-02 (×2): 2 mL via INTRADERMAL

## 2017-09-02 MED ORDER — SODIUM CHLORIDE 0.9% FLUSH: 3.0000 mL | Freq: Two times a day (BID) | INTRAVENOUS | Status: DC

## 2017-09-02 MED ORDER — IOPAMIDOL (ISOVUE-370) INJECTION 76%
INTRAVENOUS | Status: AC
  Filled 2017-09-02: qty 100

## 2017-09-02 MED ORDER — ONDANSETRON HCL 4 MG/2ML IJ SOLN: 4.0000 mg | Freq: Four times a day (QID) | INTRAMUSCULAR | Status: DC | PRN

## 2017-09-02 MED ORDER — SODIUM CHLORIDE 0.9 % IV SOLN
INTRAVENOUS | Status: DC
  Administered 2017-09-02: 09:00:00 via INTRAVENOUS

## 2017-09-02 MED ORDER — FENTANYL CITRATE (PF) 100 MCG/2ML IJ SOLN
INTRAMUSCULAR | Status: DC | PRN
  Administered 2017-09-02: 25 ug via INTRAVENOUS

## 2017-09-02 MED ORDER — VERAPAMIL HCL 2.5 MG/ML IV SOLN
INTRAVENOUS | Status: AC
  Filled 2017-09-02: qty 2

## 2017-09-02 MED ORDER — SODIUM CHLORIDE 0.9 % IV SOLN
INTRAVENOUS | Status: DC
  Administered 2017-09-02: 12:00:00 via INTRAVENOUS

## 2017-09-02 MED ORDER — IOPAMIDOL (ISOVUE-370) INJECTION 76%
INTRAVENOUS | Status: DC | PRN
  Administered 2017-09-02: 75 mL via INTRA_ARTERIAL

## 2017-09-02 MED ORDER — SODIUM CHLORIDE 0.9% FLUSH: 3.0000 mL | INTRAVENOUS | Status: DC | PRN

## 2017-09-02 MED ORDER — FENTANYL CITRATE (PF) 100 MCG/2ML IJ SOLN
INTRAMUSCULAR | Status: DC | PRN
  Administered 2017-09-02 (×4): 25 ug via INTRAVENOUS

## 2017-09-02 MED ORDER — ASPIRIN 81 MG PO CHEW
CHEWABLE_TABLET | ORAL | Status: AC
  Filled 2017-09-02: qty 4

## 2017-09-02 MED ORDER — MIDAZOLAM HCL 2 MG/2ML IJ SOLN
INTRAMUSCULAR | Status: DC | PRN
  Administered 2017-09-02: 1 mg via INTRAVENOUS

## 2017-09-02 MED ORDER — SODIUM CHLORIDE 0.9 % IV SOLN: INTRAVENOUS | Status: DC

## 2017-09-02 MED ORDER — HEPARIN SODIUM (PORCINE) 1000 UNIT/ML IJ SOLN
INTRAMUSCULAR | Status: AC
  Filled 2017-09-02: qty 1

## 2017-09-02 MED ORDER — ACETAMINOPHEN 325 MG PO TABS: 650.0000 mg | ORAL_TABLET | ORAL | Status: DC | PRN

## 2017-09-02 MED ORDER — SODIUM CHLORIDE 0.9 % IV SOLN: 250.0000 mL | INTRAVENOUS | Status: DC | PRN

## 2017-09-02 MED ORDER — VERAPAMIL HCL 2.5 MG/ML IV SOLN
INTRAVENOUS | Status: DC | PRN
  Administered 2017-09-02: 10 mL via INTRA_ARTERIAL

## 2017-09-02 MED ORDER — MIDAZOLAM HCL 5 MG/ML IJ SOLN
INTRAMUSCULAR | Status: AC
  Filled 2017-09-02: qty 2

## 2017-09-02 MED ORDER — HEPARIN (PORCINE) IN NACL 2-0.9 UNIT/ML-% IJ SOLN
INTRAMUSCULAR | Status: AC | PRN
  Administered 2017-09-02: 1000 mL

## 2017-09-02 SURGICAL SUPPLY — 12 items
CATH 5FR JL3.5 JR4 ANG PIG MP (CATHETERS) ×1 IMPLANT
CATH BALLN WEDGE 5F 110CM (CATHETERS) ×1 IMPLANT
DEVICE RAD COMP TR BAND LRG (VASCULAR PRODUCTS) ×1 IMPLANT
GLIDESHEATH SLEND SS 6F .021 (SHEATH) ×1 IMPLANT
GUIDEWIRE INQWIRE 1.5J.035X260 (WIRE) IMPLANT
INQWIRE 1.5J .035X260CM (WIRE) ×2
KIT HEART LEFT (KITS) ×2 IMPLANT
PACK CARDIAC CATHETERIZATION (CUSTOM PROCEDURE TRAY) ×2 IMPLANT
SHEATH GLIDE SLENDER 4/5FR (SHEATH) ×1 IMPLANT
SYR MEDRAD MARK V 150ML (SYRINGE) ×2 IMPLANT
TRANSDUCER W/STOPCOCK (MISCELLANEOUS) ×2 IMPLANT
TUBING CIL FLEX 10 FLL-RA (TUBING) ×2 IMPLANT

## 2017-09-02 NOTE — Interval H&P Note (Signed)
History and Physical Interval Note:  09/02/2017 10:47 AM  Catherine Gross  has presented today for surgery, with the diagnosis of hf  The various methods of treatment have been discussed with the patient and family. After consideration of risks, benefits and other options for treatment, the patient has consented to  Procedure(s): RIGHT/LEFT HEART CATH AND CORONARY ANGIOGRAPHY (N/A) as a surgical intervention .  The patient's history has been reviewed, patient examined, no change in status, stable for surgery.  I have reviewed the patient's chart and labs.  Questions were answered to the patient's satisfaction.     Konnar [name] [employer]

## 2017-09-02 NOTE — H&P (View-Only) (Signed)
PCP: Dr. Funches Referring: Lori Gerhardt HF Cardiology: Dr. Caron Tardif  46 yo with history of cardiomyopathy of uncertain etiology, HTN, and severe mitral regurgitation was referred by Lori Gerhardt for evaluation of CHF.  She has had a history of HTN for about 5 years or so.  She had pre-eclampsia with a pregnancy.  In 2016, she had an echo showing EF 35% with diffuse hypokinesis and severe MR.  Workup for secondary hypertension at that time showed elevated urinary metanephrines.  There was concern for pheochromocytoma, but it appears that this was not followed up at the time.  She had a repeat echo in 2/18 with EF 35-40% and again, severe MR.  Cardiac MRI in 7/18 showed EF 44%, severe MR, and no myocardial late gadolinium enhancement.  She recently had repeat urinary metanephrines => this actually was normal (8/18).   BP is currently controlled on medications.  She is on hydralazine but is unable to take Imdur due to headaches.  She was on Entresto in the past but had trouble affording it and is back on losartan.  She is generally doing pretty well on current medical regimen.  She can walk about 1/2 mile before she tires.  Dyspnea with stairs and inclines.  No orthopnea/PND. No chest pain. No lightheadedness, syncope, palpitations.    ECG (3/18, personally reviewed): NSR,normal  Labs (4/16): Elevated urinary normetanephrine and total metanephrines.  Labs (5/16): HIV negative Labs (4/18): K 3.7, creatinine 0.76, LDL 194, TSH normal Labs (8/18): Normal urinary metanephrines  PMH: 1. HTN: Evaluation in 2016 was concerning for pheochromocytoma with elevated urinary metanephrines but no followup.  8/18 24 hour urine collection actually showed normal urinary metanephrines.  2. Hyperlipidemia 3. Chronic systolic CHF:  - Echo (4/16): EF 35%, mild LV dilation, mild LVH, mild AI, PASP 49 mmHg, severe MR.  - Echo (2/18): EF 35-40%, moderate LV dilation, diffuse hypokinesis, moderate AI, severe MR, normal RV  size and systolic function, moderate TR, moderate PI.  - Cardiac MRI (7/18): EF 44% with mild LV dilation, mild LVH, severe central MR, no late gadolinium enhancement noted.  4. Mitral regurgitation: Severe by echo and cardiac MRI, appears to be central. Etiology uncertain.   Social History   Social History  . Marital status: Single    Spouse name: N/A  . Number of children: N/A  . Years of education: N/A   Occupational History  . Not on file.   Social History Main Topics  . Smoking status: Former Smoker    Packs/day: 0.50    Years: 20.00    Quit date: 03/22/2015  . Smokeless tobacco: Former User  . Alcohol use No  . Drug use: No  . Sexual activity: Not on file   Other Topics Concern  . Not on file   Social History Narrative  . No narrative on file   Family History  Problem Relation Age of Onset  . Heart failure Father   . Hypertension Father   . Congestive Heart Failure Father   . Alzheimer's disease Mother   . Asthma Brother    ROS: All systems reviewed and negative except as per HPI.   Current Outpatient Prescriptions  Medication Sig Dispense Refill  . carvedilol (COREG) 25 MG tablet Take 1 tablet (25 mg total) by mouth 2 (two) times daily with a meal. 180 tablet 0  . furosemide (LASIX) 40 MG tablet Take 1 tablet (40 mg total) by mouth daily. 30 tablet 5  . hydrALAZINE (APRESOLINE) 25 MG tablet   Take 1 tablet (25 mg total) by mouth 3 (three) times daily. 270 tablet 0  . potassium chloride SA (K-DUR,KLOR-CON) 20 MEQ tablet Take 2 tablets (40 mEq total) by mouth daily. 180 tablet 1  . simvastatin (ZOCOR) 20 MG tablet Take 1 tablet (20 mg total) by mouth at bedtime. 90 tablet 0  . sacubitril-valsartan (ENTRESTO) 49-51 MG Take 1 tablet by mouth 2 (two) times daily. 60 tablet 11   No current facility-administered medications for this encounter.    Pulse 91   Ht 5\' 5"  (1.651 m)   Wt 187 lb 12.8 oz (85.2 kg)   SpO2 100%   BMI 31.25 kg/m  General: NAD Neck: No JVD,  no thyromegaly or thyroid nodule.  Lungs: Clear to auscultation bilaterally with normal respiratory effort. CV: Nondisplaced PMI.  Heart regular S1/S2, +S4, 3/6 HSM apex.  No peripheral edema.  No carotid bruit.  Normal pedal pulses.  Abdomen: Soft, nontender, no hepatosplenomegaly, no distention.  Skin: Intact without lesions or rashes.  Neurologic: Alert and oriented x 3.  Psych: Normal affect. Extremities: No clubbing or cyanosis.  HEENT: Normal.   Assessment/Plan: 1. HTN: Long-standing.  BP is controlled on current regimen. Repeat workup for pheochromocytoma appears to be negative, but I will also send plasma metanephrines.  2. Chronic systolic CHF: EF 66-06% by 2/18 echo and 44% by 7/18 cMRI. No late gadolinium enhancement on MRI. Etiology uncertain.  It is possible that she could have a cardiomyopathy due to long-standing severe MR (was present on initial echo in 4/16), but so far, evaluation has not appeared to show structural problems with the MV so possibly the MR is functional and related to the cardiomyopathy. NYHA class II symptoms, she is not volume overloaded on exam.  - Would stop losartan and restart on Entresto 49/51 bid. I talked with our pharmacist and we should be able to get the medication for her at a reasonable price.  BMET today and repeat in 10 days.  - She is on hydralazine but cannot tolerate Imdur.  At next appt, I will likely stop hydralazine and put her on spironolactone.  - Continue Coreg.  - Continue current Lasix, volume status looks ok.   - I would like to do a TEE to more closely evaluate the mechanism and severity of her mitral regurgitation => could this have caused the cardiomyopathy or is it just a consequence (functional MR)?  - At the time of TEE, I will do a right and left heart cath (rule out CAD, assess filling pressures).  - I discussed risks/benefits of the procedures with her and she agrees to proceed.  3. Mitral regurgitation: Severe MR noted on  imaging back to 2016.  No definite functional abnormality of MV reported.  Possible functional MR due to cardiomyopathy, but cannot totally rule out MR as cause of cardiomyopathy. As above, plan to more closely evaluate cause of MR with TEE.   Marca Ancona 08/20/2017

## 2017-09-02 NOTE — Progress Notes (Signed)
  Echocardiogram Echocardiogram Transesophageal has been performed.  Catherine Gross F 09/02/2017, 10:40 AM

## 2017-09-02 NOTE — Progress Notes (Deleted)
  Echocardiogram 2D Echocardiogram has been performed.  Roosvelt Maser F 09/02/2017, 10:18 AM

## 2017-09-02 NOTE — Discharge Instructions (Signed)
Radial Site Care °Refer to this sheet in the next few weeks. These instructions provide you with information about caring for yourself after your procedure. Your health care provider may also give you more specific instructions. Your treatment has been planned according to current medical practices, but problems sometimes occur. Call your health care provider if you have any problems or questions after your procedure. °What can I expect after the procedure? °After your procedure, it is typical to have the following: °· Bruising at the radial site that usually fades within 1-2 weeks. °· Blood collecting in the tissue (hematoma) that may be painful to the touch. It should usually decrease in size and tenderness within 1-2 weeks. ° °Follow these instructions at home: °· Take medicines only as directed by your health care provider. °· You may shower 24-48 hours after the procedure or as directed by your health care provider. Remove the bandage (dressing) and gently wash the site with plain soap and water. Pat the area dry with a clean towel. Do not rub the site, because this may cause bleeding. °· Do not take baths, swim, or use a hot tub until your health care provider approves. °· Check your insertion site every day for redness, swelling, or drainage. °· Do not apply powder or lotion to the site. °· Do not flex or bend the affected arm for 24 hours or as directed by your health care provider. °· Do not push or pull heavy objects with the affected arm for 24 hours or as directed by your health care provider. °· Do not lift over 10 lb (4.5 kg) for 5 days after your procedure or as directed by your health care provider. °· Ask your health care provider when it is okay to: °? Return to work or school. °? Resume usual physical activities or sports. °? Resume sexual activity. °· Do not drive home if you are discharged the same day as the procedure. Have someone else drive you. °· You may drive 24 hours after the procedure  unless otherwise instructed by your health care provider. °· Do not operate machinery or power tools for 24 hours after the procedure. °· If your procedure was done as an outpatient procedure, which means that you went home the same day as your procedure, a responsible adult should be with you for the first 24 hours after you arrive home. °· Keep all follow-up visits as directed by your health care provider. This is important. °Contact a health care provider if: °· You have a fever. °· You have chills. °· You have increased bleeding from the radial site. Hold pressure on the site. °Get help right away if: °· You have unusual pain at the radial site. °· You have redness, warmth, or swelling at the radial site. °· You have drainage (other than a small amount of blood on the dressing) from the radial site. °· The radial site is bleeding, and the bleeding does not stop after 30 minutes of holding steady pressure on the site. °· Your arm or hand becomes pale, cool, tingly, or numb. °This information is not intended to replace advice given to you by your health care provider. Make sure you discuss any questions you have with your health care provider. °Document Released: 12/27/2010 Document Revised: 05/01/2016 Document Reviewed: 06/12/2014 °Elsevier Interactive Patient Education © 2018 [employer] ° ° ° °Moderate Conscious Sedation, Adult, Care After °These instructions provide you with information about caring for yourself after your procedure. Your health care provider   may also give you more specific instructions. Your treatment has been planned according to current medical practices, but problems sometimes occur. Call your health care provider if you have any problems or questions after your procedure. °What can I expect after the procedure? °After your procedure, it is common: °· To feel sleepy for several hours. °· To feel clumsy and have poor balance for several hours. °· To have poor judgment for several  hours. °· To vomit if you eat too soon. ° °Follow these instructions at home: °For at least 24 hours after the procedure: ° °· Do not: °? Participate in activities where you could fall or become injured. °? Drive. °? Use heavy machinery. °? Drink alcohol. °? Take sleeping pills or medicines that cause drowsiness. °? Make important decisions or sign legal documents. °? Take care of children on your own. °· Rest. °Eating and drinking °· Follow the diet recommended by your health care provider. °· If you vomit: °? Drink water, juice, or soup when you can drink without vomiting. °? Make sure you have little or no nausea before eating solid foods. °General instructions °· Have a responsible adult stay with you until you are awake and alert. °· Take over-the-counter and prescription medicines only as told by your health care provider. °· If you smoke, do not smoke without supervision. °· Keep all follow-up visits as told by your health care provider. This is important. °Contact a health care provider if: °· You keep feeling nauseous or you keep vomiting. °· You feel light-headed. °· You develop a rash. °· You have a fever. °Get help right away if: °· You have trouble breathing. °This information is not intended to replace advice given to you by your health care provider. Make sure you discuss any questions you have with your health care provider. °Document Released: 09/14/2013 Document Revised: 04/28/2016 Document Reviewed: 03/15/2016 °Elsevier Interactive Patient Education © 2018 Else[employer] Inc. ° °

## 2017-09-02 NOTE — Progress Notes (Signed)
Thanks so much for your help. 

## 2017-09-02 NOTE — Progress Notes (Signed)
Right brachial sheath removed. Manual pressure held for 10 minutes. 4x4 with cobane dressing applied. Pt tolerated well. 1215 pt ambulated to BR, standby assist. Ambulated and voided without difficulty.

## 2017-09-02 NOTE — CV Procedure (Signed)
Procedure: TEE  Indication: Mitral regurgitation  Sedation: Versed 5 mg IV, Fentanyl 100 mcg IV  Findings: Please see echo section for full report.  Mildly dilated LV with mild LV hypertrophy.  EF 50% with mild diffuse hypokinesis.  Normal RV size and and systolic function.  Mild left atrial enlargement, no LA appendage thrombus.  Normal right atrium.  No PFO/ASD (negative bubble study). Mild TR with peak RV-RA gradient 46 mmHg.  Trileaflet aortic valve with mild regurgitation and no stenosis.  The mitral valve appeared relatively normal => not thickened, no prolapse, no flail segments.  Possibly some restriction of posterior leaflet.  There was moderate - severe mitral regurgitation visually, predominantly central.  Quantitatively, MR was moderate with PISA ERO 0.34 cm^2 and vena contracta 0.55 cm. There was no systolic flow reversal in the pulmonary vein doppler pattern.  Mild plaque in the descending thoracic aorta, normal caliber.   Impression: Moderate to severe MR visually, moderate quantitatively.  EF better than noted in the past.    Plan to treat hypertension aggressively and repeat echo to reassess MR in the future.   Marca Ancona 09/02/2017 9:56 AM

## 2017-09-02 NOTE — Interval H&P Note (Signed)
History and Physical Interval Note:  09/02/2017 9:18 AM  Catherine Gross  has presented today for surgery, with the diagnosis of MITRAL REGURGITATION  The various methods of treatment have been discussed with the patient and family. After consideration of risks, benefits and other options for treatment, the patient has consented to  Procedure(s): TRANSESOPHAGEAL ECHOCARDIOGRAM (TEE) (N/A) as a surgical intervention .  The patient's history has been reviewed, patient examined, no change in status, stable for surgery.  I have reviewed the patient's chart and labs.  Questions were answered to the patient's satisfaction.     Keshara Kiger [employer]

## 2017-09-03 ENCOUNTER — Encounter (HOSPITAL_COMMUNITY): Payer: Self-pay | Admitting: Cardiology

## 2017-09-04 LAB — METANEPHRINES, PLASMA
METANEPHRINE FREE: 24 pg/mL (ref 0–62)
NORMETANEPHRINE FREE: 55 pg/mL (ref 0–145)

## 2017-09-10 ENCOUNTER — Ambulatory Visit (HOSPITAL_COMMUNITY)
Admission: RE | Admit: 2017-09-10 | Discharge: 2017-09-10 | Disposition: A | Discharge status: Home / Self Care | Payer: 59 | Source: Ambulatory Visit | Attending: Cardiology | Admitting: Cardiology

## 2017-09-10 VITALS — BP 136/88 | HR 84 | Wt 188.4 lb

## 2017-09-10 DIAGNOSIS — I5022 Chronic systolic (congestive) heart failure: Secondary | ICD-10-CM | POA: Diagnosis not present

## 2017-09-10 DIAGNOSIS — I340 Nonrheumatic mitral (valve) insufficiency: Secondary | ICD-10-CM | POA: Diagnosis not present

## 2017-09-10 DIAGNOSIS — E785 Hyperlipidemia, unspecified: Secondary | ICD-10-CM | POA: Diagnosis not present

## 2017-09-10 DIAGNOSIS — I110 Hypertensive heart disease with heart failure: Secondary | ICD-10-CM | POA: Insufficient documentation

## 2017-09-10 DIAGNOSIS — I420 Dilated cardiomyopathy: Secondary | ICD-10-CM | POA: Diagnosis not present

## 2017-09-10 DIAGNOSIS — I429 Cardiomyopathy, unspecified: Secondary | ICD-10-CM | POA: Insufficient documentation

## 2017-09-10 DIAGNOSIS — Z87891 Personal history of nicotine dependence: Secondary | ICD-10-CM | POA: Insufficient documentation

## 2017-09-10 LAB — BASIC METABOLIC PANEL
ANION GAP: 4 — AB (ref 5–15)
BUN: 8 mg/dL (ref 6–20)
CO2: 25 mmol/L (ref 22–32)
Calcium: 9 mg/dL (ref 8.9–10.3)
Chloride: 107 mmol/L (ref 101–111)
Creatinine, Ser: 0.75 mg/dL (ref 0.44–1.00)
GFR calc Af Amer: >60 mL/min (ref >60)
GLUCOSE: 114 mg/dL — AB (ref 65–99)
POTASSIUM: 4.2 mmol/L (ref 3.5–5.1)
Sodium: 136 mmol/L (ref 135–145)

## 2017-09-10 NOTE — Patient Instructions (Addendum)
TAKE Entresto 49/51mg  twice daily.  Follow up with PharmD clinic in 2 weeks.   Follow up with Dr.McLean in 2 months.

## 2017-09-10 NOTE — Progress Notes (Signed)
PCP: Dr. Armen Pickup Referring: Norma Fredrickson HF Cardiology: Dr. Shirlee Latch  46 yo with history of cardiomyopathy of uncertain etiology, HTN, and severe mitral regurgitation returns for followup of CHF and MR.  She has had a history of HTN for about 5 years or so.  She had pre-eclampsia with a pregnancy.  In 2016, she had an echo showing EF 35% with diffuse hypokinesis and severe MR.  Workup for secondary hypertension at that time showed elevated urinary metanephrines.  There was concern for pheochromocytoma, but it appears that this was not followed up at the time.  She had a repeat echo in 2/18 with EF 35-40% and again, severe MR.  Cardiac MRI in 7/18 showed EF 44%, severe MR, and no myocardial late gadolinium enhancement.  She had repeat urinary metanephrines => this actually was normal (8/18), serum metanephrines in 9/18 were also normal.   She had right and left heart cath in 9/18, showing no significant CAD and filling pressures were optimized.  TEE in 9/18 showed improvement in EF to 50% with moderate MR by PISA and vena contracta, moderate-severe visually.   She is doing well today.  BP appears better controlled.  No significant exertional dyspnea.  No chest pain.  No orthopnea/PND.  She is only taking Entresto once a day.  No lightheadedness.    Labs (4/16): Elevated urinary normetanephrine and total metanephrines.  Labs (5/16): HIV negative Labs (4/18): K 3.7, creatinine 0.76, LDL 194, TSH normal Labs (3/47): Normal urinary metanephrines Labs (9/18): serum metanephrines normal, hgb 11.1, K 3.6, creatinine 0.74  PMH: 1. HTN: Evaluation in 2016 was concerning for pheochromocytoma with elevated urinary metanephrines but no followup.  8/18 24 hour urine collection actually showed normal urinary metanephrines.  2. Hyperlipidemia 3. Chronic systolic CHF: Nonischemic cardiomyopathy - Echo (4/16): EF 35%, mild LV dilation, mild LVH, mild AI, PASP 49 mmHg, severe MR.  - Echo (2/18): EF 35-40%, moderate  LV dilation, diffuse hypokinesis, moderate AI, severe MR, normal RV size and systolic function, moderate TR, moderate PI.  - Cardiac MRI (7/18): EF 44% with mild LV dilation, mild LVH, severe central MR, no late gadolinium enhancement noted.  - LHC/RHC (9/18): No significant CAD; mean RA 6, PA 28/12, mean PCWP 10, 3+ MR.  - TEE (9/18): TEE 50%, mild LV dilation, visually moderate to severe MR but moderate by PISA (ERO 0.34) and vena contracta (0.55 cm), normal RV size and systolic function, peak RV-RA gradient 46 mmHg.  4. Mitral regurgitation: Severe by echo and cardiac MRI, appears to be central.  - TEE 9/18 with probably moderate MR (by PISA and vena contracta). MR central, likely functional.   Social History   Social History  . Marital status: Single    Spouse name: N/A  . Number of children: N/A  . Years of education: N/A   Occupational History  . Not on file.   Social History Main Topics  . Smoking status: Former Smoker    Packs/day: 0.50    Years: 20.00    Quit date: 03/22/2015  . Smokeless tobacco: Former Neurosurgeon  . Alcohol use No  . Drug use: No  . Sexual activity: Not on file   Other Topics Concern  . Not on file   Social History Narrative  . No narrative on file   Family History  Problem Relation Age of Onset  . Heart failure Father   . Hypertension Father   . Congestive Heart Failure Father   . Alzheimer's disease Mother   .  Asthma Brother    ROS: All systems reviewed and negative except as per HPI.   Current Outpatient Prescriptions  Medication Sig Dispense Refill  . carvedilol (COREG) 25 MG tablet Take 1 tablet (25 mg total) by mouth 2 (two) times daily with a meal. 180 tablet 0  . furosemide (LASIX) 40 MG tablet Take 1 tablet (40 mg total) by mouth daily. 30 tablet 5  . hydrALAZINE (APRESOLINE) 25 MG tablet Take 1 tablet (25 mg total) by mouth 3 (three) times daily. 270 tablet 0  . ibuprofen (ADVIL,MOTRIN) 200 MG tablet Take 800 mg by mouth every 8 (eight)  hours as needed (for pain).    . potassium chloride SA (K-DUR,KLOR-CON) 20 MEQ tablet Take 2 tablets (40 mEq total) by mouth daily. (Patient taking differently: Take 20 mEq by mouth 2 (two) times daily. ) 180 tablet 1  . sacubitril-valsartan (ENTRESTO) 49-51 MG Take 1 tablet by mouth 2 (two) times daily.    . simvastatin (ZOCOR) 20 MG tablet Take 1 tablet (20 mg total) by mouth at bedtime. 90 tablet 0   No current facility-administered medications for this encounter.    BP 136/88 (BP Location: Left Arm, Patient Position: Sitting)   Pulse 84   Wt 188 lb 6 oz (85.4 kg)   LMP 08/29/2017   SpO2 98%   BMI 31.35 kg/m  General: NAD Neck: No JVD, no thyromegaly or thyroid nodule.  Lungs: Clear to auscultation bilaterally with normal respiratory effort. CV: Nondisplaced PMI.  Heart regular S1/S2, no S3/S4, 2/6 HSM at apex.  No peripheral edema.  No carotid bruit.  Normal pedal pulses.  Abdomen: Soft, nontender, no hepatosplenomegaly, no distention.  Skin: Intact without lesions or rashes.  Neurologic: Alert and oriented x 3.  Psych: Normal affect. Extremities: No clubbing or cyanosis.  HEENT: Normal.   Assessment/Plan: 1. HTN: Long-standing.  BP is better-controlled on current regimen. Repeat workup for pheochromocytoma is negative.  2. Chronic systolic CHF: Nonischemic cardiomyopathy (no significant CAD on 9/18 cath).  EF 35-40% by 2/18 echo and 44% by 7/18 cMRI. No late gadolinium enhancement on MRI. Etiology uncertain.  It is possible that she could have a cardiomyopathy due to long-standing severe MR (was present on initial echo in 4/16), but evaluation has not appeared to show structural problems with the MV so MR may be functional and and related to the cardiomyopathy. Possible hypertensive cardiomyopathy.  EF improved to 50% on 9/18 TEE.  I think we need to aggressively control her BP to improve cardiac function.  - She needs to take Entresto 49/51 bid rather than once daily.  I will have  her followup with pharmacist in 2 wks and increase Entresto to 97/103 bid at that time.   - She is on hydralazine but cannot tolerate Imdur.   - Continue Coreg.  - Continue current Lasix, volume status looks ok and was good on RHC in 91/8.   3. Mitral regurgitation: Severe MR noted on imaging back to 2016.  TEE 9/18 with probably moderate MR in the setting of better BP control => moderate by PISA and vena contracta.  The MV appears structurally normal so suspect functional MR likely due to cardiomyopathy.  Hopefully, MR will improve with good BP control.  Followup in 2 months.     Marca Ancona 09/10/2017

## 2017-09-24 ENCOUNTER — Ambulatory Visit (HOSPITAL_COMMUNITY)
Admission: RE | Admit: 2017-09-24 | Discharge: 2017-09-24 | Disposition: A | Discharge status: Home / Self Care | Payer: 59 | Source: Ambulatory Visit | Attending: Cardiology | Admitting: Cardiology

## 2017-09-24 ENCOUNTER — Encounter (HOSPITAL_COMMUNITY): Payer: Self-pay

## 2017-09-24 DIAGNOSIS — I5022 Chronic systolic (congestive) heart failure: Secondary | ICD-10-CM | POA: Diagnosis not present

## 2017-09-24 DIAGNOSIS — Z79899 Other long term (current) drug therapy: Secondary | ICD-10-CM | POA: Insufficient documentation

## 2017-09-24 DIAGNOSIS — I110 Hypertensive heart disease with heart failure: Secondary | ICD-10-CM | POA: Diagnosis not present

## 2017-09-24 DIAGNOSIS — I340 Nonrheumatic mitral (valve) insufficiency: Secondary | ICD-10-CM | POA: Insufficient documentation

## 2017-09-24 DIAGNOSIS — I429 Cardiomyopathy, unspecified: Secondary | ICD-10-CM | POA: Diagnosis present

## 2017-09-24 DIAGNOSIS — I428 Other cardiomyopathies: Secondary | ICD-10-CM | POA: Diagnosis not present

## 2017-09-24 MED ORDER — SACUBITRIL-VALSARTAN 97-103 MG PO TABS: 1.0000 | ORAL_TABLET | Freq: Two times a day (BID) | ORAL | 3 refills | Status: DC

## 2017-09-24 NOTE — Progress Notes (Signed)
HF MD: [facility]  HPI:  46 yo with history of cardiomyopathy of uncertain etiology, HTN, and severe mitral regurgitation returns for followup of CHF and MR.  She has had a history of HTN for about 5 years or so.  She had pre-eclampsia with a pregnancy.  In 2016, she had an echo showing EF 35% with diffuse hypokinesis and severe MR.  Workup for secondary hypertension at that time showed elevated urinary metanephrines.  There was concern for pheochromocytoma, but it appears that this was not followed up at the time.  She had a repeat echo in 2/18 with EF 35-40% and again, severe MR.  Cardiac MRI in 7/18 showed EF 44%, severe MR, and no myocardial late gadolinium enhancement.  She had repeat urinary metanephrines => this actually was normal (8/18), serum metanephrines in 9/18 were also normal.   She had right and left heart cath in 9/18, showing no significant CAD and filling pressures were optimized.  TEE in 9/18 showed improvement in EF to 50% with moderate MR by PISA and vena contracta, moderate-severe visually.   She returns today for pharmacist-led HF medication titration. At last HF clinic visit on 09/10/17, her Sherryll Burger was increased to 49-51 mg TWICE DAILY (was only taking it once daily). BP appears better controlled.  No significant exertional dyspnea.  No chest pain.  No orthopnea/PND.  No lightheadedness. 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 She does not get any regular exercise but would like to start walking.      . Shortness of breath/dyspnea on exertion? no  . Orthopnea/PND? no . Edema? no . Lightheadedness/dizziness? no . Daily weights at home? no . Blood pressure/heart rate monitoring at home? No - [facility] (highest 160/90) . Following low-sodium/fluid-restricted diet? Yes - tries to stay away from high sodium foods, cup of coffee everyday   HF Medications: Carvedilol 25 mg PO BID Furosemide 40 mg PO daily  Hydralazine 25  mg PO TID KCl 20 mEq PO BID Entresto 49-51 mg PO BID  Has the patient been experiencing any side effects to the medications prescribed?  no  Does the patient have any problems obtaining medications due to transportation or finances?   No - [employer] commercial - pays $158/3 month supply of Entresto bc they will only allow her to use mail order so cannot use copay card with mail order   Understanding of regimen: good Understanding of indications: good Potential of compliance: good Patient understands to avoid NSAIDs. Patient understands to avoid decongestants.    Pertinent Lab Values: . 09/10/17: Serum creatinine 0.75, BUN 8, Potassium 4.2, Sodium 136  Vital Signs: . Weight: 188 lb (dry weight: 188 lb) . Blood pressure: 137/78 mmHg  . Heart rate: 80 bpm    Assessment: 1. Chronicsystolic CHF (EF 46-96%>>29% on 09/02/17), due to NICM (?severe MR v HTN). NYHA class Isymptoms.  - Volume status stable  - Will increase Entresto to target dose 97-103 mg BID primarily for better BP control  - Continue carvedilol 25 mg BID, furosemide 40 mg daily, hydralazine 25 mg TID, KCl 20 meq BID  - Basic disease state pathophysiology, medication indication, mechanism and side effects reviewed at length with patient and he verbalized understanding 2. HTN: Long-standing.    - BP is better-controlled on current regimen but will increase Entresto as above in attempt to reduce BP to goal <130/90 mmHg  - If needs further BP control, could consider addition  of spironolactone  - Repeat workup for pheochromocytoma is negative 3. Mitral regurgitation: Severe MR noted on imaging back to 2016.  TEE 9/18 with probably moderate MR in the setting of better BP control => moderate by PISA and vena contracta.  The MV appears structurally normal so suspect functional MR likely due to cardiomyopathy.  Hopefully, MR will improve with good BP control.  Plan: 1) Medication changes: Based on clinical presentation, vital signs  and recent labs will increase Entresto to 97-103 mg BID 2) Labs: BMET in 2 weeks 3) Follow-up: Dr. Shirlee LatchMcLean on 11/16/17   Tyler DeisErika K. Bonnye FavaNicolsen, PharmD, BCPS, CPP Clinical Pharmacist Pager: [phone] Phone: [phone] 09/24/2017 9:57 AM

## 2017-09-24 NOTE — Patient Instructions (Addendum)
It was great to see you today!  Please INCREASE your Entresto to 97-103 mg TWICE DAILY. With your current Entresto 49-51 mg tablets, you may take 2 tablets TWICE DAILY until you get the new dose.   You are scheduled for lab work on 10/08/17 at 8:00 AM.   Please keep your appointment with Dr. Shirlee Latch on 11/16/17.

## 2017-09-29 ENCOUNTER — Other Ambulatory Visit: Payer: Self-pay | Admitting: Internal Medicine

## 2017-09-30 ENCOUNTER — Ambulatory Visit: Payer: 59 | Admitting: Family Medicine

## 2017-10-08 ENCOUNTER — Ambulatory Visit (HOSPITAL_COMMUNITY)
Admission: RE | Admit: 2017-10-08 | Discharge: 2017-10-08 | Disposition: A | Discharge status: Home / Self Care | Payer: 59 | Source: Ambulatory Visit | Attending: Internal Medicine | Admitting: Internal Medicine

## 2017-10-08 ENCOUNTER — Other Ambulatory Visit (HOSPITAL_COMMUNITY): Payer: Self-pay | Admitting: *Deleted

## 2017-10-08 DIAGNOSIS — I5022 Chronic systolic (congestive) heart failure: Secondary | ICD-10-CM

## 2017-10-08 LAB — BASIC METABOLIC PANEL
Anion gap: 7 (ref 5–15)
BUN: 7 mg/dL (ref 6–20)
CHLORIDE: 108 mmol/L (ref 101–111)
CO2: 26 mmol/L (ref 22–32)
CREATININE: 0.75 mg/dL (ref 0.44–1.00)
Calcium: 9 mg/dL (ref 8.9–10.3)
GFR calc Af Amer: >60 mL/min (ref >60)
GLUCOSE: 98 mg/dL (ref 65–99)
POTASSIUM: 3.3 mmol/L — AB (ref 3.5–5.1)
SODIUM: 141 mmol/L (ref 135–145)

## 2017-10-09 ENCOUNTER — Telehealth (HOSPITAL_COMMUNITY): Payer: Self-pay

## 2017-10-09 NOTE — Telephone Encounter (Signed)
Basic Metabolic Panel (BMET)  Order: [id number]  Status:  Final result Visible to patient:  No (Not Released) Dx:  Chronic systolic heart failure (HCC)  Notes recorded by Chyrl Civatte, RN on 10/09/2017 at 9:20 AM EDT Patient reports missing "quite a few" doses of potassium recently as she has been moving this past week. Advised to take potassium as prescribed and will have labs rechecked to ensure serum K level returns to normal range. ------  Notes recorded by Laurey Morale, MD on 10/08/2017 at 2:14 PM EDT Add an extra 20 mEq KCl daily to her regimen. BMET 2 wks.

## 2017-10-18 ENCOUNTER — Encounter (HOSPITAL_COMMUNITY): Payer: Self-pay | Admitting: Emergency Medicine

## 2017-10-18 ENCOUNTER — Emergency Department (HOSPITAL_COMMUNITY)
Admission: EM | Admit: 2017-10-18 | Discharge: 2017-10-18 | Disposition: A | Discharge status: Home / Self Care | Payer: 59 | Attending: Emergency Medicine | Admitting: Emergency Medicine

## 2017-10-18 DIAGNOSIS — Z9861 Coronary angioplasty status: Secondary | ICD-10-CM | POA: Diagnosis not present

## 2017-10-18 DIAGNOSIS — Y9389 Activity, other specified: Secondary | ICD-10-CM | POA: Insufficient documentation

## 2017-10-18 DIAGNOSIS — Z79899 Other long term (current) drug therapy: Secondary | ICD-10-CM | POA: Insufficient documentation

## 2017-10-18 DIAGNOSIS — Y9289 Other specified places as the place of occurrence of the external cause: Secondary | ICD-10-CM | POA: Diagnosis not present

## 2017-10-18 DIAGNOSIS — S86111A Strain of other muscle(s) and tendon(s) of posterior muscle group at lower leg level, right leg, initial encounter: Secondary | ICD-10-CM

## 2017-10-18 DIAGNOSIS — I110 Hypertensive heart disease with heart failure: Secondary | ICD-10-CM | POA: Diagnosis not present

## 2017-10-18 DIAGNOSIS — S8991XA Unspecified injury of right lower leg, initial encounter: Secondary | ICD-10-CM | POA: Diagnosis present

## 2017-10-18 DIAGNOSIS — Y999 Unspecified external cause status: Secondary | ICD-10-CM | POA: Diagnosis not present

## 2017-10-18 DIAGNOSIS — Z87891 Personal history of nicotine dependence: Secondary | ICD-10-CM | POA: Diagnosis not present

## 2017-10-18 DIAGNOSIS — I5020 Unspecified systolic (congestive) heart failure: Secondary | ICD-10-CM | POA: Diagnosis not present

## 2017-10-18 DIAGNOSIS — X509XXA Other and unspecified overexertion or strenuous movements or postures, initial encounter: Secondary | ICD-10-CM | POA: Diagnosis not present

## 2017-10-18 NOTE — ED Triage Notes (Signed)
Pt reports she pulled her R calf a week ago. Pt began to have R calf swelling 5 days ago and started having bruising on her R calf today.

## 2017-10-18 NOTE — ED Provider Notes (Signed)
[facility]-EMERGENCY DEPT Provider Note   CSN: [id number] Arrival date & time: 10/18/17  1302     History   Chief Complaint Chief Complaint  Patient presents with  . Leg Swelling    HPI Catherine Gross is a 46 y.o. female.  HPI Week prior to presentation patient reports she stepped off a curb and her right calf got up popping sensation.  Reports she thought she pulled a muscle but got concerned this week when she started seeing purplish discoloration and some increased swelling down around her ankle and foot.  Pain has improved and she is up and ambulatory with it.  No other associated symptoms. Past Medical History:  Diagnosis Date  . Benign essential HTN   . Cardiomyopathy (HCC)   . Mitral regurgitation   . Preeclampsia    first pregnancy, not with susequent pregnancy    Patient Active Problem List   Diagnosis Date Noted  . Dilated cardiomyopathy (HCC) 04/02/2015  . Systolic heart failure (HCC) 04/02/2015  . Accelerated hypertension 03/22/2015  . Sinus tachycardia 03/22/2015  . Normocytic anemia     Past Surgical History:  Procedure Laterality Date  . CESAREAN SECTION    . RIGHT/LEFT HEART CATH AND CORONARY ANGIOGRAPHY N/A 09/02/2017   Performed by Laurey Morale, MD at [facility] INVASIVE CV LAB  . TRANSESOPHAGEAL ECHOCARDIOGRAM (TEE) N/A 09/02/2017   Performed by Laurey Morale, MD at [facility] ENDOSCOPY    OB History    No data available       Home Medications    Prior to Admission medications   Medication Sig Start Date End Date Taking? Authorizing Provider  carvedilol (COREG) 25 MG tablet Take 1 tablet (25 mg total) by mouth 2 (two) times daily with a meal. 08/19/17   McClung, Marzella Schlein, PA-C  furosemide (LASIX) 40 MG tablet Take 1 tablet (40 mg total) by mouth daily. 05/26/17 11/22/17  Anders Simmonds, PA-C  hydrALAZINE (APRESOLINE) 25 MG tablet Take 1 tablet (25 mg total) by mouth 3 (three) times daily. 08/19/17   Anders Simmonds, PA-C    ibuprofen (ADVIL,MOTRIN) 200 MG tablet Take 800 mg by mouth every 8 (eight) hours as needed (for pain).    [provider]  potassium chloride SA (K-DUR,KLOR-CON) 20 MEQ tablet Take 20 mEq by mouth 2 (two) times daily.    [provider]  sacubitril-valsartan (ENTRESTO) 97-103 MG Take 1 tablet by mouth 2 (two) times daily. 09/24/17   Laurey Morale, MD  simvastatin (ZOCOR) 20 MG tablet Take 1 tablet (20 mg total) by mouth at bedtime. 08/19/17   Anders Simmonds, PA-C    Family History Family History  Problem Relation Age of Onset  . Heart failure Father   . Hypertension Father   . Congestive Heart Failure Father   . Alzheimer's disease Mother   . Asthma Brother     Social History Social History   Tobacco Use  . Smoking status: Former Smoker    Packs/day: 0.50    Years: 20.00    Pack years: 10.00    Last attempt to quit: 03/22/2015    Years since quitting: 2.5  . Smokeless tobacco: Former Engineer, water Use Topics  . Alcohol use: No  . Drug use: No     Allergies   Patient has no known allergies.   Review of Systems Review of Systems Constitutional: No fever no chills no malaise Respiratory: No shortness of breath no cough Cardiovascular: No chest pain  Physical Exam Updated Vital Signs BP (!) 156/90 (BP Location: Right Arm)   Pulse 99   Temp 98.3 F (36.8 C) (Oral)   Resp 20   SpO2 99%   Physical Exam  Constitutional: She is oriented to person, place, and time. She appears well-developed and well-nourished. No distress.  HENT:  Head: Normocephalic and atraumatic.  Eyes: EOM are normal.  Cardiovascular: Normal rate, regular rhythm and normal heart sounds.  Pulmonary/Chest: Effort normal and breath sounds normal.  Musculoskeletal:  Patient's right lower extremity has ecchymotic discoloration below the gastrocnemius tracking down to the ankle and layering above the sole of the foot.  Mild edema at the ankle and lower leg.  Calf is highly  tender to compression.  Achilles tendon nontender.  Normal plantar extension with compression of calf muscles.  Patient neurovascularly intact.  Neurological: She is alert and oriented to person, place, and time. No cranial nerve deficit. She exhibits normal muscle tone. Coordination normal.  Skin: Skin is warm and dry.  Psychiatric: She has a normal mood and affect.     ED Treatments / Results  Labs (all labs ordered are listed, but only abnormal results are displayed) Labs Reviewed - No data to display  EKG  EKG Interpretation None       Radiology No results found.  Procedures Procedures (including critical care time)  Medications Ordered in ED Medications - No data to display   Initial Impression / Assessment and Plan / ED Course  I have reviewed the triage vital signs and the nursing notes.  Pertinent labs & imaging results that were available during my care of the patient were reviewed by me and considered in my medical decision making (see chart for details).     Final Clinical Impressions(s) / ED Diagnoses   Final diagnoses:  Rupture of right gastrocnemius tendon, initial encounter  Patient with classic history of soleus or gastrocnemius partial tear.  Patient clearly felt a popping sensation when stepping off a curb.  She has resolving hematoma that is now tracking down gravitationally toward the foot and ankle.  No sign of Achilles tear or rupture.  Patient is improving.  She will continue elevating and compression.  ED Discharge Orders    None       Arby BarrettePfeiffer, Tanav Orsak, MD 10/23/17 1620

## 2017-11-12 ENCOUNTER — Telehealth (HOSPITAL_COMMUNITY): Payer: Self-pay | Admitting: Cardiology

## 2017-11-12 NOTE — Telephone Encounter (Signed)
Called and left message for patient to call back, need to reschedule 11/16/17 appt with Dr. Shirlee Latch to 11/18/17 in the afternoon, d/t poss inclement weather.

## 2017-11-16 ENCOUNTER — Inpatient Hospital Stay (HOSPITAL_COMMUNITY): Admission: RE | Admit: 2017-11-16 | Payer: 59 | Source: Ambulatory Visit | Admitting: Cardiology

## 2017-11-18 ENCOUNTER — Ambulatory Visit: Payer: 59 | Admitting: Nurse Practitioner

## 2017-11-18 NOTE — Telephone Encounter (Signed)
Norma Fredrickson, NP, sw with pt due to pt being followed in Heart Failure Clinic, message was sent to Meredith Staggers, RN in HF clinic to reschedule pt.

## 2017-11-20 NOTE — Telephone Encounter (Signed)
appt sch for 1/2 w/Dr Shirlee Latch

## 2017-12-09 ENCOUNTER — Encounter (HOSPITAL_COMMUNITY): Payer: Self-pay | Admitting: Cardiology

## 2017-12-09 ENCOUNTER — Ambulatory Visit (HOSPITAL_COMMUNITY)
Admission: RE | Admit: 2017-12-09 | Discharge: 2017-12-09 | Disposition: A | Discharge status: Home / Self Care | Payer: BLUE CROSS/BLUE SHIELD | Source: Ambulatory Visit | Attending: Cardiology | Admitting: Cardiology

## 2017-12-09 VITALS — BP 162/104 | HR 94 | Wt 194.8 lb

## 2017-12-09 DIAGNOSIS — E785 Hyperlipidemia, unspecified: Secondary | ICD-10-CM | POA: Diagnosis not present

## 2017-12-09 DIAGNOSIS — Z87891 Personal history of nicotine dependence: Secondary | ICD-10-CM | POA: Diagnosis not present

## 2017-12-09 DIAGNOSIS — I110 Hypertensive heart disease with heart failure: Secondary | ICD-10-CM | POA: Diagnosis not present

## 2017-12-09 DIAGNOSIS — I428 Other cardiomyopathies: Secondary | ICD-10-CM | POA: Insufficient documentation

## 2017-12-09 DIAGNOSIS — Z79899 Other long term (current) drug therapy: Secondary | ICD-10-CM | POA: Insufficient documentation

## 2017-12-09 DIAGNOSIS — I340 Nonrheumatic mitral (valve) insufficiency: Secondary | ICD-10-CM | POA: Diagnosis not present

## 2017-12-09 DIAGNOSIS — I5022 Chronic systolic (congestive) heart failure: Secondary | ICD-10-CM | POA: Insufficient documentation

## 2017-12-09 DIAGNOSIS — I420 Dilated cardiomyopathy: Secondary | ICD-10-CM

## 2017-12-09 LAB — BASIC METABOLIC PANEL
Anion gap: 5 (ref 5–15)
BUN: 9 mg/dL (ref 6–20)
CHLORIDE: 105 mmol/L (ref 101–111)
CO2: 26 mmol/L (ref 22–32)
CREATININE: 0.83 mg/dL (ref 0.44–1.00)
Calcium: 8.7 mg/dL — ABNORMAL LOW (ref 8.9–10.3)
GFR calc non Af Amer: >60 mL/min (ref >60)
Glucose, Bld: 102 mg/dL — ABNORMAL HIGH (ref 65–99)
POTASSIUM: 4.1 mmol/L (ref 3.5–5.1)
Sodium: 136 mmol/L (ref 135–145)

## 2017-12-09 MED ORDER — CARVEDILOL 25 MG PO TABS: 25.0000 mg | ORAL_TABLET | Freq: Two times a day (BID) | ORAL | 3 refills | Status: DC

## 2017-12-09 NOTE — Patient Instructions (Signed)
Take Carvedilol 25 mg (1 tab), twice a day  Labs drawn today (if we do not call you, then your lab work was stable)   Your physician recommends that you schedule a follow-up appointment in: 3 months with Dr. Shirlee Latch (we will call you)

## 2017-12-09 NOTE — Progress Notes (Signed)
Advanced Heart Failure Clinic Note   PCP: Dr. Armen Pickup Referring: Norma Fredrickson HF Cardiology: Dr. Shirlee Latch  47 yo with history of cardiomyopathy of uncertain etiology, HTN, and severe mitral regurgitation returns for followup of CHF and MR.  She has had a history of HTN for about 5 years or so.  She had pre-eclampsia with a pregnancy.  In 2016, she had an echo showing EF 35% with diffuse hypokinesis and severe MR.  Workup for secondary hypertension at that time showed elevated urinary metanephrines.  There was concern for pheochromocytoma, but it appears that this was not followed up at the time.  She had a repeat echo in 2/18 with EF 35-40% and again, severe MR.  Cardiac MRI in 7/18 showed EF 44%, severe MR, and no myocardial late gadolinium enhancement.  She had repeat urinary metanephrines => this actually was normal (8/18), serum metanephrines in 9/18 were also normal.   She had right and left heart cath in 9/18, showing no significant CAD and filling pressures were optimized.  TEE in 9/18 showed improvement in EF to 50% with moderate MR by PISA and vena contracta, moderate-severe visually.   She presents today for regular follow. BP elevated on arrival. She otherwise feels good, no significant dyspnea/good exercise tolerance.  No orthopnea/PND.  No chest pain.   Labs (4/16): Elevated urinary normetanephrine and total metanephrines.  Labs (5/16): HIV negative Labs (4/18): K 3.7, creatinine 0.76, LDL 194, TSH normal Labs (3/73): Normal urinary metanephrines Labs (9/18): serum metanephrines normal, hgb 11.1, K 3.6, creatinine 0.74  PMH: 1. HTN: Evaluation in 2016 was concerning for pheochromocytoma with elevated urinary metanephrines but no followup.  8/18 24 hour urine collection actually showed normal urinary metanephrines.  2. Hyperlipidemia 3. Chronic systolic CHF: Nonischemic cardiomyopathy - Echo (4/16): EF 35%, mild LV dilation, mild LVH, mild AI, PASP 49 mmHg, severe MR.  - Echo  (2/18): EF 35-40%, moderate LV dilation, diffuse hypokinesis, moderate AI, severe MR, normal RV size and systolic function, moderate TR, moderate PI.  - Cardiac MRI (7/18): EF 44% with mild LV dilation, mild LVH, severe central MR, no late gadolinium enhancement noted.  - LHC/RHC (9/18): No significant CAD; mean RA 6, PA 28/12, mean PCWP 10, 3+ MR.  - TEE (9/18): TEE 50%, mild LV dilation, visually moderate to severe MR but moderate by PISA (ERO 0.34) and vena contracta (0.55 cm), normal RV size and systolic function, peak RV-RA gradient 46 mmHg.  4. Mitral regurgitation: Severe by echo and cardiac MRI, appears to be central.  - TEE 9/18 with probably moderate MR (by PISA and vena contracta). MR central, likely functional.   Social History   Socioeconomic History  . Marital status: Single    Spouse name: Not on file  . Number of children: Not on file  . Years of education: Not on file  . Highest education level: Not on file  Social Needs  . Financial resource strain: Not on file  . Food insecurity - worry: Not on file  . Food insecurity - inability: Not on file  . Transportation needs - medical: Not on file  . Transportation needs - non-medical: Not on file  Occupational History  . Not on file  Tobacco Use  . Smoking status: Former Smoker    Packs/day: 0.50    Years: 20.00    Pack years: 10.00    Last attempt to quit: 03/22/2015    Years since quitting: 2.7  . Smokeless tobacco: Former Engineer, water and  Sexual Activity  . Alcohol use: No  . Drug use: No  . Sexual activity: Not on file  Other Topics Concern  . Not on file  Social History Narrative  . Not on file   Family History  Problem Relation Age of Onset  . Heart failure Father   . Hypertension Father   . Congestive Heart Failure Father   . Alzheimer's disease Mother   . Asthma Brother    Review of systems complete and found to be negative unless listed in HPI.    Current Outpatient Medications  Medication Sig  Dispense Refill  . carvedilol (COREG) 25 MG tablet Take 1 tablet (25 mg total) by mouth 2 (two) times daily with a meal. 180 tablet 0  . furosemide (LASIX) 40 MG tablet Take 1 tablet (40 mg total) by mouth daily. 30 tablet 5  . hydrALAZINE (APRESOLINE) 25 MG tablet Take 1 tablet (25 mg total) by mouth 3 (three) times daily. 270 tablet 0  . ibuprofen (ADVIL,MOTRIN) 200 MG tablet Take 800 mg by mouth every 8 (eight) hours as needed (for pain).    . potassium chloride SA (K-DUR,KLOR-CON) 20 MEQ tablet Take 20 mEq by mouth 2 (two) times daily.    . sacubitril-valsartan (ENTRESTO) 97-103 MG Take 1 tablet by mouth 2 (two) times daily. 180 tablet 3  . simvastatin (ZOCOR) 20 MG tablet Take 1 tablet (20 mg total) by mouth at bedtime. 90 tablet 0   No current facility-administered medications for this encounter.    Vitals:   12/09/17 1352  BP: (!) 162/104  Pulse: 94  SpO2: 97%  Weight: 194 lb 12.8 oz (88.4 kg)   Wt Readings from Last 3 Encounters:  12/09/17 194 lb 12.8 oz (88.4 kg)  09/24/17 188 lb 4 oz (85.4 kg)  09/10/17 188 lb 6 oz (85.4 kg)    General: Well appearing. No resp difficulty. HEENT: Normal Neck: Supple. JVP 5-6. Carotids 2+ bilat; no bruits. No thyromegaly or nodule noted. Cor: PMI nondisplaced. RRR, 2/6 HSM at apex.  Lungs: CTAB, normal effort. Abdomen: Soft, non-tender, non-distended, no HSM. No bruits or masses. +BS  Extremities: No cyanosis, clubbing, or rash. R and LLE no edema.  Neuro: Alert & orientedx3, cranial nerves grossly intact. moves all 4 extremities w/o difficulty. Affect pleasant   Assessment/Plan: 1. HTN:  - Long standing.  - BP high today but has been off Coreg, restart Coreg.  - Repeat workup for pheochromocytoma is negative.  2. Chronic systolic CHF: Nonischemic cardiomyopathy (no significant CAD on 9/18 cath).  EF 35-40% by 2/18 echo and 44% by 7/18 cMRI. No late gadolinium enhancement on MRI. Etiology uncertain.  It is possible that she could have a  cardiomyopathy due to long-standing severe MR (was present on initial echo in 4/16), but evaluation has not appeared to show structural problems with the MV so MR may be functional and and related to the cardiomyopathy. Possible hypertensive cardiomyopathy.  EF improved to 50% on 9/18 TEE. - NYHA I-II - Volume status stable.  - Continue lasix 40 mg daily.  - Continue Entresto 97/103 mg BID. BMET today.  - Continue hydralazine 25 mg TID. Cannot tolerate Imdur.   - Restart Coreg 25 mg BID 3. Mitral regurgitation: Severe MR noted on imaging back to 2016.  TEE 9/18 with probably moderate MR in the setting of better BP control => moderate by PISA and vena contracta.  The MV appears structurally normal so suspect functional MR likely due to cardiomyopathy.   -  Goal and hope is that MR will improved with good BP control.     Graciella Freer, PA-C  12/09/2017  Patient seen with PA, agree with the above note.  NYHA class I-II symptoms.  Not volume overloaded on exam.  She is hypertensive today but has been off Coreg.  - Restart Coreg 25 mg bid.   Moderate MR on TEE in 9/18.  Will repeat echo in 9/19 to follow MR.   Marca Ancona 12/09/2017

## 2018-02-27 IMAGING — MR MR CARD MORPHOLOGY WO/W CM
8 of 9 series · 15 of 16 positions shown · IV contrast (25    Multihance)
Comparison: none

CLINICAL DATA: Cardiomyopathy of uncertain etiology

EXAM:
CARDIAC MRI
TECHNIQUE: The patient was scanned on a 1.5 Tesla GE magnet. A dedicated
cardiac coil was used. Functional imaging was done using Fiesta
sequences. [DATE], and 4 chamber views were done to assess for RWMA's.
Modified Yoly rule using a short axis stack was used to
calculate an ejection fraction on a dedicated work station using
Circle software. The patient received 38 cc of Multihance. After 10
minutes inversion recovery sequences were used to assess for
infiltration and scar tissue.
CONTRAST:  38 cc Multihance contrast

[Series 3: bSSFP · sagittal · 8.0mm · 1.56mm/px · 1 of 14 slices shown (1 of 4)]
[im 1/14]
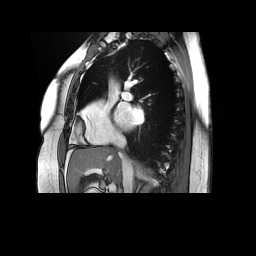

[Series 4: bSSFP · axial · 8.0mm · 1.56mm/px · 1 of 20 slices shown (2 of 4)]
[im 1/20]
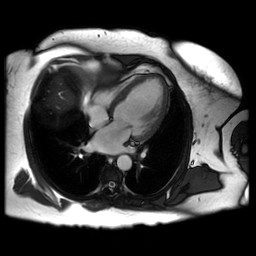

[Series 5: bSSFP · oblique · 8.0mm · 1.68mm/px · 8 of 360 slices shown (3 of 4)]
[im 1/360]
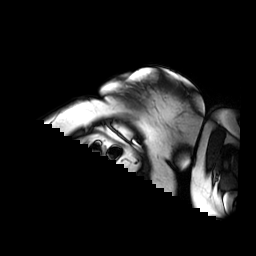
[im 52/360]
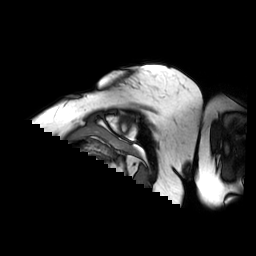
[im 103/360]
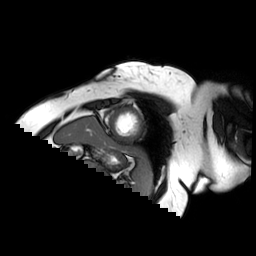
[im 154/360]
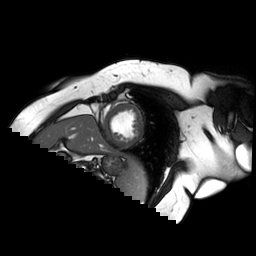
[im 206/360]
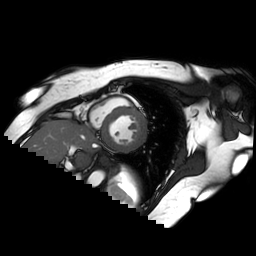
[im 257/360]
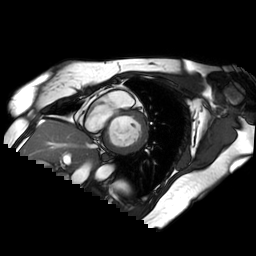
[im 308/360]
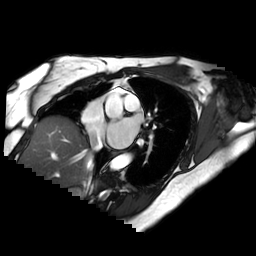
[im 360/360]
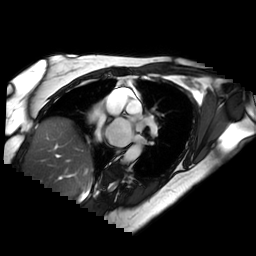

[Series 6: T2 · axial · 8.0mm · 1.68mm/px · 1 of 60 slices shown]
[im 1/60]
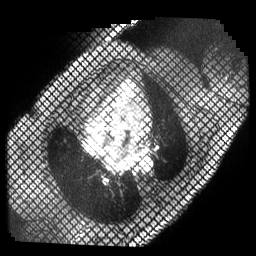

[Series 7: bSSFP · axial · 8.0mm · 1.68mm/px · 1 of 60 slices shown (4 of 4)]
[im 1/60]
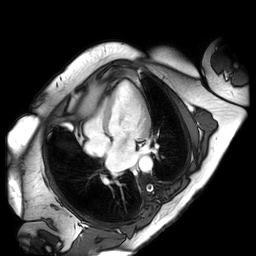

[Series 8: cine ir · oblique · 8.0mm · 1.76mm/px · 1 of 30 slices shown]
[im 1/30]
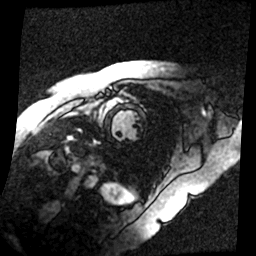

[Series 11: delayed ir prep · oblique · 8.0mm · 1.72mm/px · 1 of 16 slices shown]
[im 1/16]
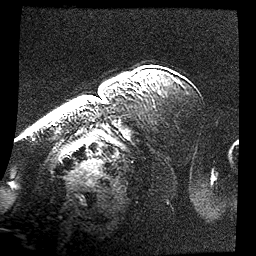

[Series 13: rad delayed ir · axial · 8.0mm · 1.68mm/px · 1 of 2 slices shown]
[im 1/2]
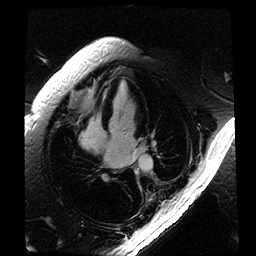

[15 of 16 positions shown; findings below may reference images not displayed]

FINDINGS: Limited images of the lung fields showed no gross abnormalities.

Mild left ventricular hypertrophy with mildly dilated LV. LV EF 44%
with diffuse mild hypokinesis. Moderately dilated left atrium.
Normal right ventricular size and systolic function. Normal right
atrial size. Trileaflet aortic valve with no stenosis. There was
mild aortic insufficiency. Mitral regurgitation appears severe
though flow sequences to quantify were not done. The mitral
regurgitation is central, ?functional.

On delayed enhancement imaging, there was no myocardial late
gadolinium enhancement (LGE).

MEASUREMENTS:
MEASUREMENTS
LVEDV 198 mL

LV SV 88 mL

LV EF 44%
IMPRESSION: 1. Mild LV dilation with mild LV hypertrophy. EF 44%, diffuse mild
hypokinesis.

2.  Normal RV size and systolic function.

3. Mitral regurgitation appears severe and central. The valve does
not appear thickened.

4. On delayed enhancement imaging, there was no apparent LGE.
Therefore, no definitive evidence for prior MI, myocarditis, or
infiltrative disease. Suspect nonischemic cardiomyopathy.

Nilda Ofori

## 2018-03-29 ENCOUNTER — Telehealth (HOSPITAL_COMMUNITY): Payer: Self-pay | Admitting: *Deleted

## 2018-03-29 NOTE — Telephone Encounter (Signed)
[id number]

## 2018-03-29 NOTE — Telephone Encounter (Signed)
Entresto 97-103 mg PA approved from 04-03-2017 through 04/03/2019

## 2018-05-19 ENCOUNTER — Ambulatory Visit: Payer: BLUE CROSS/BLUE SHIELD | Attending: Family Medicine | Admitting: Physician Assistant

## 2018-05-19 VITALS — BP 121/81 | HR 84 | Temp 97.6°F | Resp 18 | Ht 65.0 in | Wt 190.0 lb

## 2018-05-19 DIAGNOSIS — Z79899 Other long term (current) drug therapy: Secondary | ICD-10-CM | POA: Diagnosis not present

## 2018-05-19 DIAGNOSIS — I340 Nonrheumatic mitral (valve) insufficiency: Secondary | ICD-10-CM | POA: Insufficient documentation

## 2018-05-19 DIAGNOSIS — I5022 Chronic systolic (congestive) heart failure: Secondary | ICD-10-CM | POA: Diagnosis not present

## 2018-05-19 DIAGNOSIS — I420 Dilated cardiomyopathy: Secondary | ICD-10-CM | POA: Diagnosis not present

## 2018-05-19 DIAGNOSIS — I10 Essential (primary) hypertension: Secondary | ICD-10-CM | POA: Diagnosis not present

## 2018-05-19 DIAGNOSIS — I110 Hypertensive heart disease with heart failure: Secondary | ICD-10-CM | POA: Diagnosis not present

## 2018-05-19 DIAGNOSIS — E7800 Pure hypercholesterolemia, unspecified: Secondary | ICD-10-CM | POA: Diagnosis not present

## 2018-05-19 MED ORDER — FUROSEMIDE 40 MG PO TABS: 40.0000 mg | ORAL_TABLET | Freq: Every day | ORAL | 1 refills | Status: DC

## 2018-05-19 MED ORDER — SACUBITRIL-VALSARTAN 97-103 MG PO TABS: 1.0000 | ORAL_TABLET | Freq: Two times a day (BID) | ORAL | 3 refills | Status: DC

## 2018-05-19 MED ORDER — SIMVASTATIN 20 MG PO TABS: 20.0000 mg | ORAL_TABLET | Freq: Every day | ORAL | 1 refills | Status: DC

## 2018-05-19 MED ORDER — CARVEDILOL 25 MG PO TABS: 25.0000 mg | ORAL_TABLET | Freq: Two times a day (BID) | ORAL | 3 refills | Status: DC

## 2018-05-19 MED ORDER — HYDRALAZINE HCL 25 MG PO TABS: 25.0000 mg | ORAL_TABLET | Freq: Three times a day (TID) | ORAL | 1 refills | Status: DC

## 2018-05-19 MED ORDER — POTASSIUM CHLORIDE CRYS ER 20 MEQ PO TBCR: 20.0000 meq | EXTENDED_RELEASE_TABLET | Freq: Two times a day (BID) | ORAL | 1 refills | Status: DC

## 2018-05-19 NOTE — Progress Notes (Signed)
Patient ID: Catherine Gross, female   DOB: 08/30/1971, 47 y.o.   MRN: [id number]   Catherine Gross, is a 46 y.o. female  ZNB:[id number]  [id number]  DOB - 1971/09/03  Subjective:  Chief Complaint and HPI: Catherine Gross is a 47 y.o. female here today for med RF.  Denies any problems or complaints.  No SOB/CP/HA/dizziness.    ROS:   Constitutional:  No f/c, No night sweats, No unexplained weight loss. EENT:  No vision changes, No blurry vision, No hearing changes. No mouth, throat, or ear problems.  Respiratory: No cough, No SOB Cardiac: No CP, no palpitations GI:  No abd pain, No N/V/D. GU: No Urinary s/sx Musculoskeletal: No joint pain Neuro: No headache, no dizziness, no motor weakness.  Skin: No rash Endocrine:  No polydipsia. No polyuria.  Psych: Denies SI/HI  No problems updated.  ALLERGIES: No Known Allergies  PAST MEDICAL HISTORY: Past Medical History:  Diagnosis Date  . Benign essential HTN   . Cardiomyopathy (HCC)   . Mitral regurgitation   . Preeclampsia    first pregnancy, not with susequent pregnancy    MEDICATIONS AT HOME: Prior to Admission medications   Medication Sig Start Date End Date Taking? Authorizing Provider  carvedilol (COREG) 25 MG tablet Take 1 tablet (25 mg total) by mouth 2 (two) times daily with a meal. 05/19/18  Yes Danely Bayliss M, PA-C  furosemide (LASIX) 40 MG tablet Take 1 tablet (40 mg total) by mouth daily. 05/19/18 11/15/18 Yes Douglas Smolinsky, Marzella Schlein, PA-C  hydrALAZINE (APRESOLINE) 25 MG tablet Take 1 tablet (25 mg total) by mouth 3 (three) times daily. 05/19/18  Yes Shanekqua Schaper, Marzella Schlein, PA-C  ibuprofen (ADVIL,MOTRIN) 200 MG tablet Take 800 mg by mouth every 8 (eight) hours as needed (for pain).   Yes [provider]  potassium chloride SA (K-DUR,KLOR-CON) 20 MEQ tablet Take 1 tablet (20 mEq total) by mouth 2 (two) times daily. 05/19/18  Yes Kele Withem M, PA-C  sacubitril-valsartan (ENTRESTO) 97-103 MG Take 1 tablet by mouth 2 (two)  times daily. 05/19/18  Yes Georgian Co M, PA-C  simvastatin (ZOCOR) 20 MG tablet Take 1 tablet (20 mg total) by mouth at bedtime. 05/19/18  Yes Traves Majchrzak, Marzella Schlein, PA-C     Objective:  EXAM:   Vitals:   05/19/18 1008  BP: 121/81  Pulse: 84  Resp: 18  Temp: 97.6 F (36.4 C)  TempSrc: Oral  SpO2: 99%  Weight: 190 lb (86.2 kg)  Height: 5\' 5"  (1.651 m)    General appearance : A&OX3. NAD. Non-toxic-appearing HEENT: Atraumatic and Normocephalic.  PERRLA. EOM intact.  Neck: supple, no JVD. No cervical lymphadenopathy. No thyromegaly Chest/Lungs:  Breathing-non-labored, Good air entry bilaterally, breath sounds normal without rales, rhonchi, or wheezing  CVS: S1 S2 regular, no murmurs, gallops, rubs  Extremities: Bilateral Lower Ext shows no edema, both legs are warm to touch with = pulse throughout Neurology:  CN II-XII grossly intact, Non focal.   Psych:  TP linear. J/I WNL. Normal speech. Appropriate eye contact and affect.  Skin:  No Rash  Data Review No results found for: HGBA1C   Assessment & Plan   1. Dilated cardiomyopathy (HCC) Controlled.continue current regimen - carvedilol (COREG) 25 MG tablet; Take 1 tablet (25 mg total) by mouth 2 (two) times daily with a meal.  Dispense: 180 tablet; Refill: 3 - furosemide (LASIX) 40 MG tablet; Take 1 tablet (40 mg total) by mouth daily.  Dispense: 90 tablet; Refill: 1 - sacubitril-valsartan (ENTRESTO) 97-103  MG; Take 1 tablet by mouth 2 (two) times daily.  Dispense: 180 tablet; Refill: 3  2. Accelerated hypertension Controlled-continue current regimen - hydrALAZINE (APRESOLINE) 25 MG tablet; Take 1 tablet (25 mg total) by mouth 3 (three) times daily.  Dispense: 270 tablet; Refill: 1 - potassium chloride SA (K-DUR,KLOR-CON) 20 MEQ tablet; Take 1 tablet (20 mEq total) by mouth 2 (two) times daily.  Dispense: 180 tablet; Refill: 1 - sacubitril-valsartan (ENTRESTO) 97-103 MG; Take 1 tablet by mouth 2 (two) times daily.  Dispense:  180 tablet; Refill: 3 - Comprehensive metabolic panel  3. Hypercholesterolemia stable - simvastatin (ZOCOR) 20 MG tablet; Take 1 tablet (20 mg total) by mouth at bedtime.  Dispense: 90 tablet; Refill: 1 - Lipid panel  4. Chronic systolic congestive heart failure (HCC) stable - furosemide (LASIX) 40 MG tablet; Take 1 tablet (40 mg total) by mouth daily.  Dispense: 90 tablet; Refill: 1 - sacubitril-valsartan (ENTRESTO) 97-103 MG; Take 1 tablet by mouth 2 (two) times daily.  Dispense: 180 tablet; Refill: 3  Patient have been counseled extensively about nutrition and exercise  Return in about 3 months (around 08/19/2018) for assign PCP; f/up BP/hyperlipidemia.  The patient was given clear instructions to go to ER or return to medical center if symptoms don't improve, worsen or new problems develop. The patient verbalized understanding. The patient was told to call to get lab results if they haven't heard anything in the next week.     Georgian Co, PA-C [facility] [place], [place] [phone]   05/19/2018, 10:17 AM

## 2018-05-20 LAB — LIPID PANEL
Chol/HDL Ratio: 6.1 ratio — ABNORMAL HIGH (ref 0.0–4.4)
Cholesterol, Total: 244 mg/dL — ABNORMAL HIGH (ref 100–199)
HDL: 40 mg/dL (ref >39)
LDL CALC: 186 mg/dL — AB (ref 0–99)
Triglycerides: 92 mg/dL (ref 0–149)
VLDL Cholesterol Cal: 18 mg/dL (ref 5–40)

## 2018-05-20 LAB — COMPREHENSIVE METABOLIC PANEL
ALBUMIN: 4 g/dL (ref 3.5–5.5)
ALK PHOS: 54 IU/L (ref 39–117)
ALT: 8 IU/L (ref 0–32)
AST: 11 IU/L (ref 0–40)
Albumin/Globulin Ratio: 1.4 (ref 1.2–2.2)
BILIRUBIN TOTAL: 0.3 mg/dL (ref 0.0–1.2)
BUN/Creatinine Ratio: 15 (ref 9–23)
BUN: 13 mg/dL (ref 6–24)
CHLORIDE: 107 mmol/L — AB (ref 96–106)
CO2: 22 mmol/L (ref 20–29)
CREATININE: 0.86 mg/dL (ref 0.57–1.00)
Calcium: 9 mg/dL (ref 8.7–10.2)
GFR calc non Af Amer: 82 mL/min/{1.73_m2} (ref >59)
GFR, EST AFRICAN AMERICAN: 94 mL/min/{1.73_m2} (ref >59)
GLOBULIN, TOTAL: 2.9 g/dL (ref 1.5–4.5)
Glucose: 97 mg/dL (ref 65–99)
Potassium: 4.8 mmol/L (ref 3.5–5.2)
SODIUM: 141 mmol/L (ref 134–144)
Total Protein: 6.9 g/dL (ref 6.0–8.5)

## 2018-05-21 ENCOUNTER — Telehealth: Payer: Self-pay | Admitting: *Deleted

## 2018-05-21 NOTE — Telephone Encounter (Signed)
-----   Message from Anders Simmonds, [place] sent at 05/20/2018  8:28 AM EDT ----- Please call patient.  Her cholesterol is high.  Tell her to make sure and take her cholesterol meds and eat a heart healthy diet.  Her other labs are normal.  Thanks, Georgian Co, PA-C

## 2018-05-21 NOTE — Telephone Encounter (Signed)
Patient verified DOB Patient is aware of cholesterol being high and needing to take cholesterol medication as prescribed and eat a heart healthy diet. Patient is aware of all other labs being normal. No further questions.

## 2018-06-22 ENCOUNTER — Inpatient Hospital Stay (HOSPITAL_COMMUNITY)
Admission: EM | Admit: 2018-06-22 | Discharge: 2018-06-24 | DRG: 065 | Disposition: A | Discharge status: Home / Self Care | Payer: BLUE CROSS/BLUE SHIELD | Attending: Internal Medicine | Admitting: Internal Medicine

## 2018-06-22 ENCOUNTER — Other Ambulatory Visit: Payer: Self-pay

## 2018-06-22 ENCOUNTER — Inpatient Hospital Stay (HOSPITAL_COMMUNITY): Payer: BLUE CROSS/BLUE SHIELD

## 2018-06-22 ENCOUNTER — Encounter (HOSPITAL_COMMUNITY): Payer: Self-pay

## 2018-06-22 ENCOUNTER — Emergency Department (HOSPITAL_COMMUNITY): Payer: BLUE CROSS/BLUE SHIELD

## 2018-06-22 DIAGNOSIS — E785 Hyperlipidemia, unspecified: Secondary | ICD-10-CM | POA: Diagnosis not present

## 2018-06-22 DIAGNOSIS — R402362 Coma scale, best motor response, obeys commands, at arrival to emergency department: Secondary | ICD-10-CM | POA: Diagnosis not present

## 2018-06-22 DIAGNOSIS — Z79899 Other long term (current) drug therapy: Secondary | ICD-10-CM | POA: Diagnosis not present

## 2018-06-22 DIAGNOSIS — I429 Cardiomyopathy, unspecified: Secondary | ICD-10-CM | POA: Diagnosis present

## 2018-06-22 DIAGNOSIS — I110 Hypertensive heart disease with heart failure: Secondary | ICD-10-CM | POA: Diagnosis present

## 2018-06-22 DIAGNOSIS — I639 Cerebral infarction, unspecified: Secondary | ICD-10-CM

## 2018-06-22 DIAGNOSIS — Z87891 Personal history of nicotine dependence: Secondary | ICD-10-CM

## 2018-06-22 DIAGNOSIS — R402142 Coma scale, eyes open, spontaneous, at arrival to emergency department: Secondary | ICD-10-CM | POA: Diagnosis present

## 2018-06-22 DIAGNOSIS — I351 Nonrheumatic aortic (valve) insufficiency: Secondary | ICD-10-CM | POA: Diagnosis not present

## 2018-06-22 DIAGNOSIS — I5042 Chronic combined systolic (congestive) and diastolic (congestive) heart failure: Secondary | ICD-10-CM | POA: Diagnosis present

## 2018-06-22 DIAGNOSIS — I63312 Cerebral infarction due to thrombosis of left middle cerebral artery: Secondary | ICD-10-CM

## 2018-06-22 DIAGNOSIS — I10 Essential (primary) hypertension: Secondary | ICD-10-CM

## 2018-06-22 DIAGNOSIS — D649 Anemia, unspecified: Secondary | ICD-10-CM | POA: Diagnosis present

## 2018-06-22 DIAGNOSIS — R402252 Coma scale, best verbal response, oriented, at arrival to emergency department: Secondary | ICD-10-CM | POA: Diagnosis not present

## 2018-06-22 DIAGNOSIS — Z8249 Family history of ischemic heart disease and other diseases of the circulatory system: Secondary | ICD-10-CM

## 2018-06-22 DIAGNOSIS — I6381 Other cerebral infarction due to occlusion or stenosis of small artery: Secondary | ICD-10-CM | POA: Diagnosis not present

## 2018-06-22 DIAGNOSIS — Z716 Tobacco abuse counseling: Secondary | ICD-10-CM | POA: Diagnosis not present

## 2018-06-22 DIAGNOSIS — I5022 Chronic systolic (congestive) heart failure: Secondary | ICD-10-CM | POA: Diagnosis not present

## 2018-06-22 DIAGNOSIS — R29700 NIHSS score 0: Secondary | ICD-10-CM | POA: Diagnosis present

## 2018-06-22 DIAGNOSIS — I5020 Unspecified systolic (congestive) heart failure: Secondary | ICD-10-CM | POA: Diagnosis present

## 2018-06-22 LAB — CBC WITH DIFFERENTIAL/PLATELET
Basophils Absolute: 0 K/uL (ref 0.0–0.1)
Basophils Relative: 0 %
Eosinophils Absolute: 0.1 K/uL (ref 0.0–0.7)
Eosinophils Relative: 2 %
HCT: 34.2 % — ABNORMAL LOW (ref 36.0–46.0)
Hemoglobin: 10.8 g/dL — ABNORMAL LOW (ref 12.0–15.0)
Lymphocytes Relative: 32 %
Lymphs Abs: 1.4 K/uL (ref 0.7–4.0)
MCH: 26.7 pg (ref 26.0–34.0)
MCHC: 31.6 g/dL (ref 30.0–36.0)
MCV: 84.7 fL (ref 78.0–100.0)
Monocytes Absolute: 0.4 K/uL (ref 0.1–1.0)
Monocytes Relative: 8 %
Neutro Abs: 2.5 K/uL (ref 1.7–7.7)
Neutrophils Relative %: 58 %
Platelets: 278 K/uL (ref 150–400)
RBC: 4.04 MIL/uL (ref 3.87–5.11)
RDW: 15.9 % — ABNORMAL HIGH (ref 11.5–15.5)
WBC: 4.3 K/uL (ref 4.0–10.5)

## 2018-06-22 LAB — BASIC METABOLIC PANEL WITH GFR
Anion gap: 7 (ref 5–15)
BUN: 14 mg/dL (ref 6–20)
CO2: 27 mmol/L (ref 22–32)
Calcium: 9.3 mg/dL (ref 8.9–10.3)
Chloride: 108 mmol/L (ref 98–111)
Creatinine, Ser: 0.78 mg/dL (ref 0.44–1.00)
GFR calc Af Amer: >60 mL/min
GFR calc non Af Amer: >60 mL/min
Glucose, Bld: 96 mg/dL (ref 70–99)
Potassium: 3.7 mmol/L (ref 3.5–5.1)
Sodium: 142 mmol/L (ref 135–145)

## 2018-06-22 LAB — RAPID URINE DRUG SCREEN, HOSP PERFORMED
AMPHETAMINES: NOT DETECTED
Benzodiazepines: NOT DETECTED
Cocaine: NOT DETECTED
OPIATES: NOT DETECTED
TETRAHYDROCANNABINOL: NOT DETECTED

## 2018-06-22 LAB — URINALYSIS, ROUTINE W REFLEX MICROSCOPIC
Bilirubin Urine: NEGATIVE
Glucose, UA: NEGATIVE mg/dL
Hgb urine dipstick: NEGATIVE
Ketones, ur: NEGATIVE mg/dL
Nitrite: NEGATIVE
Protein, ur: NEGATIVE mg/dL
Specific Gravity, Urine: 1.009 (ref 1.005–1.030)
pH: 7 (ref 5.0–8.0)

## 2018-06-22 LAB — I-STAT BETA HCG BLOOD, ED (MC, WL, AP ONLY): I-stat hCG, quantitative: <5 m[IU]/mL

## 2018-06-22 LAB — PROTIME-INR
INR: 1
PROTHROMBIN TIME: 13.1 s (ref 11.4–15.2)

## 2018-06-22 LAB — I-STAT TROPONIN, ED: Troponin i, poc: 0 ng/mL (ref 0.00–0.08)

## 2018-06-22 LAB — APTT: aPTT: 31 s (ref 24–36)

## 2018-06-22 MED ORDER — SENNOSIDES-DOCUSATE SODIUM 8.6-50 MG PO TABS: 1.0000 | ORAL_TABLET | Freq: Every evening | ORAL | Status: DC | PRN

## 2018-06-22 MED ORDER — ACETAMINOPHEN 650 MG RE SUPP: 650.0000 mg | RECTAL | Status: DC | PRN

## 2018-06-22 MED ORDER — ASPIRIN 300 MG RE SUPP: 300.0000 mg | Freq: Every day | RECTAL | Status: DC

## 2018-06-22 MED ORDER — ATORVASTATIN CALCIUM 80 MG PO TABS
80.0000 mg | ORAL_TABLET | Freq: Every day | ORAL | Status: DC
  Administered 2018-06-23: 80 mg via ORAL
  Filled 2018-06-22: qty 1

## 2018-06-22 MED ORDER — ACETAMINOPHEN 325 MG PO TABS: 650.0000 mg | ORAL_TABLET | ORAL | Status: DC | PRN

## 2018-06-22 MED ORDER — ENOXAPARIN SODIUM 40 MG/0.4ML ~~LOC~~ SOLN
40.0000 mg | SUBCUTANEOUS | Status: DC
  Administered 2018-06-22 – 2018-06-23 (×2): 40 mg via SUBCUTANEOUS
  Filled 2018-06-22 (×2): qty 0.4

## 2018-06-22 MED ORDER — ASPIRIN 325 MG PO TABS
325.0000 mg | ORAL_TABLET | Freq: Every day | ORAL | Status: DC
  Administered 2018-06-22: 325 mg via ORAL
  Filled 2018-06-22: qty 1

## 2018-06-22 MED ORDER — ACETAMINOPHEN 160 MG/5ML PO SOLN: 650.0000 mg | ORAL | Status: DC | PRN

## 2018-06-22 MED ORDER — SIMETHICONE 80 MG PO CHEW
160.0000 mg | CHEWABLE_TABLET | Freq: Four times a day (QID) | ORAL | Status: DC | PRN
  Administered 2018-06-22: 160 mg via ORAL
  Filled 2018-06-22: qty 2

## 2018-06-22 MED ORDER — STROKE: EARLY STAGES OF RECOVERY BOOK
Freq: Once | Status: AC
  Administered 2018-06-22: 22:00:00

## 2018-06-22 NOTE — ED Notes (Signed)
Patient aware urine sample is needed.  Family at bedside.

## 2018-06-22 NOTE — H&P (Signed)
[facility] History and Physical  Atticus Metze VJK:[id number] DOB: 1971-11-13 DOA: 06/22/2018  PCP: Dessa Phi, MD  Patient coming from: home  Chief Complaint: Right-sided numbness  HPI: Catherine Gross is a 47 y.o. female with a medical history of hypertension, heart failure, who presented to the emergency department with complaints of right arm and leg numbness which is been ongoing for for the last 24 hours.  Patient denies any recent trauma or illness.  She denies any current headache, dizziness, visual changes or neck pain.  Patient currently denies chest pain, shortness breath, abdominal pain, nausea or vomiting, diarrhea constipation, increased frequency or dysuria, recent travel or ill contacts.  ED Course: MRI showed positive CVA.  Neurology consulted.  TRH called for admission.  Review of Systems:  All other systems reviewed and are negative.   Past Medical History:  Diagnosis Date  . Benign essential HTN   . Cardiomyopathy (HCC)   . Mitral regurgitation   . Preeclampsia    first pregnancy, not with susequent pregnancy    Past Surgical History:  Procedure Laterality Date  . CESAREAN SECTION    . RIGHT/LEFT HEART CATH AND CORONARY ANGIOGRAPHY N/A 09/02/2017   Procedure: RIGHT/LEFT HEART CATH AND CORONARY ANGIOGRAPHY;  Surgeon: Laurey Morale, MD;  Location: [facility] INVASIVE CV LAB;  Service: Cardiovascular;  Laterality: N/A;  . TEE WITHOUT CARDIOVERSION N/A 09/02/2017   Procedure: TRANSESOPHAGEAL ECHOCARDIOGRAM (TEE);  Surgeon: Laurey Morale, MD;  Location: [facility] ENDOSCOPY;  Service: Cardiovascular;  Laterality: N/A;    Social History:  reports that she quit smoking about 3 years ago. She has a 10.00 pack-year smoking history. She has quit using smokeless tobacco. She reports that she does not drink alcohol or use drugs.  No Known Allergies  Family History  Problem Relation Age of Onset  . Heart failure Father   . Hypertension Father   . Congestive Heart  Failure Father   . Alzheimer's disease Mother   . Asthma Brother     Prior to Admission medications   Medication Sig Start Date End Date Taking? Authorizing Provider  carvedilol (COREG) 25 MG tablet Take 1 tablet (25 mg total) by mouth 2 (two) times daily with a meal. 05/19/18  Yes McClung, Angela M, PA-C  furosemide (LASIX) 40 MG tablet Take 1 tablet (40 mg total) by mouth daily. 05/19/18 11/15/18 Yes McClung, Marzella Schlein, PA-C  hydrALAZINE (APRESOLINE) 25 MG tablet Take 1 tablet (25 mg total) by mouth 3 (three) times daily. 05/19/18  Yes McClung, Marzella Schlein, PA-C  ibuprofen (ADVIL,MOTRIN) 200 MG tablet Take 800 mg by mouth every 8 (eight) hours as needed (for pain).   Yes [provider]  potassium chloride SA (K-DUR,KLOR-CON) 20 MEQ tablet Take 1 tablet (20 mEq total) by mouth 2 (two) times daily. 05/19/18  Yes McClung, Angela M, PA-C  sacubitril-valsartan (ENTRESTO) 97-103 MG Take 1 tablet by mouth 2 (two) times daily. 05/19/18  Yes Georgian Co M, PA-C  simvastatin (ZOCOR) 20 MG tablet Take 1 tablet (20 mg total) by mouth at bedtime. 05/19/18  Yes Anders Simmonds, PA-C    Physical Exam: Vitals:   06/22/18 1300 06/22/18 1354  BP: (!) 146/90 (!) 169/93  Pulse: 75 76  Resp: (!) 24 19  Temp:    SpO2: 100% 100%     General: Well developed, well nourished, NAD, appears stated age  HEENT: NCAT, PERRLA, EOMI, Anicteic Sclera, mucous membranes moist.   Neck: Supple, no JVD, no masses  Cardiovascular: S1 S2 auscultated,  1/6SEM, Regular rate and rhythm.  Respiratory: Clear to auscultation bilaterally with equal chest rise  Abdomen: Soft, nontender, nondistended, + bowel sounds  Extremities: warm dry without cyanosis clubbing or edema  Neuro: AAOx3, cranial nerves grossly intact. Strength 5/5 in patient's upper and lower extremities bilaterally.  Normal sensation.  Intact finger-nose, heel shin testing  Skin: Without rashes exudates or nodules  Psych: Normal affect and  demeanor with intact judgement and insight  Labs on Admission: I have personally reviewed following labs and imaging studies CBC: Recent Labs  Lab 06/22/18 0928  WBC 4.3  NEUTROABS 2.5  HGB 10.8*  HCT 34.2*  MCV 84.7  PLT 278   Basic Metabolic Panel: Recent Labs  Lab 06/22/18 0928  NA 142  K 3.7  CL 108  CO2 27  GLUCOSE 96  BUN 14  CREATININE 0.78  CALCIUM 9.3   GFR: Estimated Creatinine Clearance: 96 mL/min (by C-G formula based on SCr of 0.78 mg/dL). Liver Function Tests: No results for input(s): AST, ALT, ALKPHOS, BILITOT, PROT, ALBUMIN in the last 168 hours. No results for input(s): LIPASE, AMYLASE in the last 168 hours. No results for input(s): AMMONIA in the last 168 hours. Coagulation Profile: Recent Labs  Lab 06/22/18 0928  INR 1.00   Cardiac Enzymes: No results for input(s): CKTOTAL, CKMB, CKMBINDEX, TROPONINI in the last 168 hours. BNP (last 3 results) No results for input(s): PROBNP in the last 8760 hours. HbA1C: No results for input(s): HGBA1C in the last 72 hours. CBG: No results for input(s): GLUCAP in the last 168 hours. Lipid Profile: No results for input(s): CHOL, HDL, LDLCALC, TRIG, CHOLHDL, LDLDIRECT in the last 72 hours. Thyroid Function Tests: No results for input(s): TSH, T4TOTAL, FREET4, T3FREE, THYROIDAB in the last 72 hours. Anemia Panel: No results for input(s): VITAMINB12, FOLATE, FERRITIN, TIBC, IRON, RETICCTPCT in the last 72 hours. Urine analysis: No results found for: COLORURINE, APPEARANCEUR, LABSPEC, PHURINE, GLUCOSEU, HGBUR, BILIRUBINUR, KETONESUR, PROTEINUR, UROBILINOGEN, NITRITE, LEUKOCYTESUR Sepsis Labs: @LABRCNTIP (procalcitonin:4,lacticidven:4) )No results found for this or any previous visit (from the past 240 hour(s)).   Radiological Exams on Admission: Ct Head Wo Contrast  Result Date: 06/22/2018 CLINICAL DATA:  Right arm and leg tingling. EXAM: CT HEAD WITHOUT CONTRAST TECHNIQUE: Contiguous axial images were  obtained from the base of the skull through the vertex without intravenous contrast. COMPARISON:  None. FINDINGS: Brain: Nonspecific low density within the deep white matter. No acute infarction, hemorrhage or hydrocephalus. Vascular: No hyperdense vessel or unexpected calcification. Skull: No acute calvarial abnormality. Sinuses/Orbits: Visualized paranasal sinuses and mastoids clear. Orbital soft tissues unremarkable. Other: None IMPRESSION: Nonspecific low-density throughout the deep white matter. This could reflect chronic microvascular ischemic changes or other demyelinating process such as vasculitis or multiple sclerosis. This could be further evaluated with MRI if felt clinically indicated. Electronically Signed   By: Charlett Nose M.D.   On: 06/22/2018 09:49   Mr Brain Wo Contrast (neuro Protocol)  Result Date: 06/22/2018 CLINICAL DATA:  Right arm and leg tingling. EXAM: MRI HEAD WITHOUT CONTRAST TECHNIQUE: Multiplanar, multiecho pulse sequences of the brain and surrounding structures were obtained without intravenous contrast. COMPARISON:  Head CT 06/22/2018 FINDINGS: Brain: There is an acute 6 mm infarct involving the posterior limb of the left internal capsule/lateral margin of the left thalamus. There is a chronic lacunar infarct just superior to this involving the posterior aspect of the left thalamus and adjacent white matter. Chronic lacunar infarcts are also present in the right internal capsule and bilateral cerebellum.  Patchy T2 hyperintensities throughout the subcortical and deep cerebral white matter and pons are nonspecific but compatible with moderate chronic small vessel ischemic disease, markedly advanced for age. The ventricles and sulci are normal. No intracranial hemorrhage, mass, midline shift, or extra-axial fluid collection is identified. Vascular: Major intracranial vascular flow voids are preserved. Skull and upper cervical spine: Unremarkable bone marrow signal. Sinuses/Orbits:  Unremarkable orbits. Paranasal sinuses and mastoid air cells are clear. Other: None. IMPRESSION: 1. Acute lacunar infarct in the posterior limb of the left internal capsule/lateral thalamus. 2. Markedly age advanced chronic small vessel ischemic disease with multiple chronic lacunar infarcts as above. Electronically Signed   By: Sebastian Ache M.D.   On: 06/22/2018 12:45    EKG: Independently reviewed.  Sinus rhythm, rate 75  Assessment/Plan  Acute CVA -Presented with right upper and lower extremity paresthesias -CT Head showed nonspecific low density throughout the deep white matter, could reflect chronic microvascular ischemic changes or demyelinating process -MRI brain: Acute lacunar infarct in the posterior limb of the left internal capsule/lateral thalamus -Patient will be admitted to [facility] for neurology evaluation -Neurology consulted and appreciated -Wll place patient on aspirin, high-dose steroids (atorvastatin 80 mg) -Echocardiogram, carotid Doppler, PT, OT, hemoglobin A1c, lipid panel ordered  Essential hypertension -Home medications to allow for permissive hypertension given acute CVA  History of combined systolic and diastolic heart failure -Echocardiogram will be repeated -Patient follows with Dr. Shirlee Latch -Hold home medications given acute CVA -Monitor intake and output, daily weights -Currently appears to be euvolemic and compensated  Chronic normocytic anemia -Hemoglobin appears to be stable and at baseline, continue to monitor CBC  DVT prophylaxis: lovenox  Code Status: Full  Family Communication: None at bedside. Admission, patients condition and plan of care including tests being ordered have been discussed with the patient, who indicates understanding and agrees with the plan and Code Status.  Disposition Plan: Home   Consults called: Neurology   Admission status: Admitted to [facility] Telemetry   Time spent: 70 minutes  [employer] [facility] Pager [phone]  If 7PM-7AM, please contact night-coverage [email or web address] Password [facility] 06/22/2018, 2:31 PM

## 2018-06-22 NOTE — ED Notes (Signed)
[facility] called for transport 

## 2018-06-22 NOTE — Progress Notes (Signed)
Patient just arrived on the unit via [facility] from [facility] ED. Pt is alert and oriented x 4. Will place patient on monitor, do a Modified NIH and vital signs.

## 2018-06-22 NOTE — ED Notes (Signed)
Gave report to [facility], [facility].

## 2018-06-22 NOTE — ED Notes (Signed)
ED TO INPATIENT HANDOFF REPORT  Name/Age/Gender Catherine Gross 47 y.o. female  Code Status Code Status History    Date Active Date Inactive Code Status Order ID Comments User Context   09/02/2017 1223 09/02/2017 1806 Full Code [id number]  Larey Dresser, MD Inpatient   03/22/2015 0433 03/30/2015 1801 Full Code [id number]  Lavina Hamman, MD ED      Home/SNF/Other Home  Chief Complaint Rt Side Arm/Legg Numbess  Level of Care/Admitting Diagnosis ED Disposition    ED Disposition Condition [place]: [place] [[id number]]  Level of Care: Telemetry [5]  Diagnosis: Acute CVA (cerebrovascular accident) [facility]) [[id number]]  Admitting Physician: Cristal Ford [phone]  Attending Physician: Cristal Ford [phone]  Estimated length of stay: past midnight tomorrow  Certification:: I certify this patient will need inpatient services for at least 2 midnights  PT Class (Do Not Modify): Inpatient [101]  PT Acc Code (Do Not Modify): Private [1]       Medical History Past Medical History:  Diagnosis Date  . Benign essential HTN   . Cardiomyopathy ([place])   . Mitral regurgitation   . Preeclampsia    first pregnancy, not with susequent pregnancy    Allergies No Known Allergies  IV Location/Drains/Wounds Patient Lines/Drains/Airways Status   Active Line/Drains/Airways    Name:   Placement date:   Placement time:   Site:   Days:   Peripheral IV 06/22/18 Left Antecubital   06/22/18    1330    Antecubital   less than 1          Labs/Imaging Results for orders placed or performed during the hospital encounter of 06/22/18 (from the past 48 hour(s))  CBC with Differential/Platelet     Status: Abnormal   Collection Time: 06/22/18  9:28 AM  Result Value Ref Range   WBC 4.3 4.0 - 10.5 K/uL   RBC 4.04 3.87 - 5.11 MIL/uL   Hemoglobin 10.8 (L) 12.0 - 15.0 g/dL   HCT 34.2 (L) 36.0 - 46.0 %   MCV 84.7 78.0 - 100.0 fL   MCH 26.7 26.0 - 34.0 pg    MCHC 31.6 30.0 - 36.0 g/dL   RDW 15.9 (H) 11.5 - 15.5 %   Platelets 278 150 - 400 K/uL   Neutrophils Relative % 58 %   Neutro Abs 2.5 1.7 - 7.7 K/uL   Lymphocytes Relative 32 %   Lymphs Abs 1.4 0.7 - 4.0 K/uL   Monocytes Relative 8 %   Monocytes Absolute 0.4 0.1 - 1.0 K/uL   Eosinophils Relative 2 %   Eosinophils Absolute 0.1 0.0 - 0.7 K/uL   Basophils Relative 0 %   Basophils Absolute 0.0 0.0 - 0.1 K/uL    Comment: Performed at [facility], [place] [address] ,  [address]  Basic metabolic panel     Status: None   Collection Time: 06/22/18  9:28 AM  Result Value Ref Range   Sodium 142 135 - 145 mmol/L   Potassium 3.7 3.5 - 5.1 mmol/L   Chloride 108 98 - 111 mmol/L    Comment: Please note change in reference range.   CO2 27 22 - 32 mmol/L   Glucose, Bld 96 70 - 99 mg/dL    Comment: Please note change in reference range.   BUN 14 6 - 20 mg/dL    Comment: Please note change in reference range.   Creatinine, Ser 0.78 0.44 - 1.00 mg/dL   Calcium  9.3 8.9 - 10.3 mg/dL   GFR calc non Af Amer >60 >60 mL/min   GFR calc Af Amer >60 >60 mL/min    Comment: (NOTE) The eGFR has been calculated using the CKD EPI equation. This calculation has not been validated in all clinical situations. eGFR's persistently <60 mL/min signify possible Chronic Kidney Disease.    Anion gap 7 5 - 15    Comment: Performed at [facility], [place] [address], [place], [place] [address]  Protime-INR     Status: None   Collection Time: 06/22/18  9:28 AM  Result Value Ref Range   Prothrombin Time 13.1 11.4 - 15.2 seconds   INR 1.00     Comment: Performed at [facility], [place] [address] [place], [place] [address]  APTT     Status: None   Collection Time: 06/22/18  9:28 AM  Result Value Ref Range   aPTT 31 24 - 36 seconds    Comment: Performed at [facility], [place] [address], [place], [place] [address]  I-stat troponin, ED      Status: None   Collection Time: 06/22/18  1:32 PM  Result Value Ref Range   Troponin i, poc 0.00 0.00 - 0.08 ng/mL   Comment 3            Comment: Due to the release kinetics of cTnI, a negative result within the first hours of the onset of symptoms does not rule out myocardial infarction with certainty. If myocardial infarction is still suspected, repeat the test at appropriate intervals.   I-Stat beta hCG blood, ED     Status: None   Collection Time: 06/22/18  1:33 PM  Result Value Ref Range   I-stat hCG, quantitative <5.0 <5 mIU/mL   Comment 3            Comment:   GEST. AGE      CONC.  (mIU/mL)   <=1 WEEK        5 - 50     2 WEEKS       50 - 500     3 WEEKS       100 - 10,000     4 WEEKS     1,000 - 30,000        FEMALE AND NON-PREGNANT FEMALE:     LESS THAN 5 mIU/mL   Urine rapid drug screen (hosp performed)     Status: Abnormal   Collection Time: 06/22/18  2:55 PM  Result Value Ref Range   Opiates NONE DETECTED NONE DETECTED   Cocaine NONE DETECTED NONE DETECTED   Benzodiazepines NONE DETECTED NONE DETECTED   Amphetamines NONE DETECTED NONE DETECTED   Tetrahydrocannabinol NONE DETECTED NONE DETECTED   Barbiturates (A) NONE DETECTED    Result not available. Reagent lot number recalled by manufacturer.    Comment: Performed at [facility], [place] [address] [place], [place] [address]  Urinalysis, Routine w reflex microscopic     Status: Abnormal   Collection Time: 06/22/18  2:55 PM  Result Value Ref Range   Color, Urine YELLOW YELLOW   APPearance HAZY (A) CLEAR   Specific Gravity, Urine 1.009 1.005 - 1.030   pH 7.0 5.0 - 8.0   Glucose, UA NEGATIVE NEGATIVE mg/dL   Hgb urine dipstick NEGATIVE NEGATIVE   Bilirubin Urine NEGATIVE NEGATIVE   Ketones, ur NEGATIVE NEGATIVE mg/dL   Protein, ur NEGATIVE NEGATIVE mg/dL   Nitrite NEGATIVE NEGATIVE  Leukocytes, UA LARGE (A) NEGATIVE   RBC / HPF 11-20 0 - 5 RBC/hpf   WBC, UA 0-5 0 - 5 WBC/hpf   Bacteria,  UA RARE (A) NONE SEEN   Squamous Epithelial / LPF 11-20 0 - 5   Mucus PRESENT     Comment: Performed at [facility], [place] [address] [place], [place] [address]   Ct Head Wo Contrast  Result Date: 06/22/2018 CLINICAL DATA:  Right arm and leg tingling. EXAM: CT HEAD WITHOUT CONTRAST TECHNIQUE: Contiguous axial images were obtained from the base of the skull through the vertex without intravenous contrast. COMPARISON:  None. FINDINGS: Brain: Nonspecific low density within the deep white matter. No acute infarction, hemorrhage or hydrocephalus. Vascular: No hyperdense vessel or unexpected calcification. Skull: No acute calvarial abnormality. Sinuses/Orbits: Visualized paranasal sinuses and mastoids clear. Orbital soft tissues unremarkable. Other: None IMPRESSION: Nonspecific low-density throughout the deep white matter. This could reflect chronic microvascular ischemic changes or other demyelinating process such as vasculitis or multiple sclerosis. This could be further evaluated with MRI if felt clinically indicated. Electronically Signed   By: Rolm Baptise M.D.   On: 06/22/2018 09:49   Mr Brain Wo Contrast (neuro Protocol)  Result Date: 06/22/2018 CLINICAL DATA:  Right arm and leg tingling. EXAM: MRI HEAD WITHOUT CONTRAST TECHNIQUE: Multiplanar, multiecho pulse sequences of the brain and surrounding structures were obtained without intravenous contrast. COMPARISON:  Head CT 06/22/2018 FINDINGS: Brain: There is an acute 6 mm infarct involving the posterior limb of the left internal capsule/lateral margin of the left thalamus. There is a chronic lacunar infarct just superior to this involving the posterior aspect of the left thalamus and adjacent white matter. Chronic lacunar infarcts are also present in the right internal capsule and bilateral cerebellum. Patchy T2 hyperintensities throughout the subcortical and deep cerebral white matter and pons are nonspecific but compatible with  moderate chronic small vessel ischemic disease, markedly advanced for age. The ventricles and sulci are normal. No intracranial hemorrhage, mass, midline shift, or extra-axial fluid collection is identified. Vascular: Major intracranial vascular flow voids are preserved. Skull and upper cervical spine: Unremarkable bone marrow signal. Sinuses/Orbits: Unremarkable orbits. Paranasal sinuses and mastoid air cells are clear. Other: None. IMPRESSION: 1. Acute lacunar infarct in the posterior limb of the left internal capsule/lateral thalamus. 2. Markedly age advanced chronic small vessel ischemic disease with multiple chronic lacunar infarcts as above. Electronically Signed   By: Logan Bores M.D.   On: 06/22/2018 12:45    Pending Labs [employer] (From admission, onward)   Start     Ordered   Signed and Held  HIV antibody (Routine Testing)  Once,   R     Signed and Held   Signed and Held  Hemoglobin A1c  Tomorrow morning,   R     Signed and Held   Signed and Held  Lipid panel  Tomorrow morning,   R    Comments:  Fasting    Signed and Held      Vitals/Pain Today's Vitals   06/22/18 1500 06/22/18 1515 06/22/18 1530 06/22/18 1730  BP: (!) 166/95  (!) 167/99 (!) 161/96  Pulse: 74 74 85 74  Resp: (!) 24 (!) 26 (!) 23 18  Temp:      TempSrc:      SpO2: 100% 100% 100% 100%  Weight:      Height:      PainSc:        Isolation Precautions No active isolations  Medications Medications - No data to display  Mobility walks with person assist

## 2018-06-22 NOTE — ED Provider Notes (Signed)
[facility]-EMERGENCY DEPT Provider Note   CSN: [id number] Arrival date & time: 06/22/18  0749     History   Chief Complaint Chief Complaint  Patient presents with  . Numbness    HPI Catherine Gross is a 47 y.o. female.  47 year old female presents with over 24-hour history of paresthesias to her right lateral upper extremity as well as her right lower medial thigh.  Denies any associated neck pain.  No visual changes.  Has not had any headache.  No recent medication changes.  The symptoms have been persistent and have not been associated with bowel or bladder dysfunction.  Denies any gait instability.  No rashes noted to her skin.  No treatment for this used prior to arrival     Past Medical History:  Diagnosis Date  . Benign essential HTN   . Cardiomyopathy (HCC)   . Mitral regurgitation   . Preeclampsia    first pregnancy, not with susequent pregnancy    Patient Active Problem List   Diagnosis Date Noted  . Dilated cardiomyopathy (HCC) 04/02/2015  . Systolic heart failure (HCC) 04/02/2015  . Accelerated hypertension 03/22/2015  . Sinus tachycardia 03/22/2015  . Normocytic anemia     Past Surgical History:  Procedure Laterality Date  . CESAREAN SECTION    . RIGHT/LEFT HEART CATH AND CORONARY ANGIOGRAPHY N/A 09/02/2017   Procedure: RIGHT/LEFT HEART CATH AND CORONARY ANGIOGRAPHY;  Surgeon: Laurey Morale, MD;  Location: [facility] INVASIVE CV LAB;  Service: Cardiovascular;  Laterality: N/A;  . TEE WITHOUT CARDIOVERSION N/A 09/02/2017   Procedure: TRANSESOPHAGEAL ECHOCARDIOGRAM (TEE);  Surgeon: Laurey Morale, MD;  Location: [facility] ENDOSCOPY;  Service: Cardiovascular;  Laterality: N/A;     OB History   None      Home Medications    Prior to Admission medications   Medication Sig Start Date End Date Taking? Authorizing Provider  carvedilol (COREG) 25 MG tablet Take 1 tablet (25 mg total) by mouth 2 (two) times daily with a meal. 05/19/18   McClung,  Marzella Schlein, PA-C  furosemide (LASIX) 40 MG tablet Take 1 tablet (40 mg total) by mouth daily. 05/19/18 11/15/18  Anders Simmonds, PA-C  hydrALAZINE (APRESOLINE) 25 MG tablet Take 1 tablet (25 mg total) by mouth 3 (three) times daily. 05/19/18   Anders Simmonds, PA-C  ibuprofen (ADVIL,MOTRIN) 200 MG tablet Take 800 mg by mouth every 8 (eight) hours as needed (for pain).    [provider]  potassium chloride SA (K-DUR,KLOR-CON) 20 MEQ tablet Take 1 tablet (20 mEq total) by mouth 2 (two) times daily. 05/19/18   Anders Simmonds, PA-C  sacubitril-valsartan (ENTRESTO) 97-103 MG Take 1 tablet by mouth 2 (two) times daily. 05/19/18   Anders Simmonds, PA-C  simvastatin (ZOCOR) 20 MG tablet Take 1 tablet (20 mg total) by mouth at bedtime. 05/19/18   Anders Simmonds, PA-C    Family History Family History  Problem Relation Age of Onset  . Heart failure Father   . Hypertension Father   . Congestive Heart Failure Father   . Alzheimer's disease Mother   . Asthma Brother     Social History Social History   Tobacco Use  . Smoking status: Former Smoker    Packs/day: 0.50    Years: 20.00    Pack years: 10.00    Last attempt to quit: 03/22/2015    Years since quitting: 3.2  . Smokeless tobacco: Former Engineer, water Use Topics  . Alcohol use: No  .  Drug use: No     Allergies   Patient has no known allergies.   Review of Systems Review of Systems  All other systems reviewed and are negative.    Physical Exam Updated Vital Signs BP (!) 148/88 (BP Location: Right Arm)   Pulse 82   Temp 98 F (36.7 C) (Oral)   Resp 14   Ht 1.651 m (5\' 5" )   Wt 85.7 kg (189 lb)   SpO2 98%   BMI 31.45 kg/m   Physical Exam  Constitutional: She is oriented to person, place, and time. She appears well-developed and well-nourished.  Non-toxic appearance. No distress.  HENT:  Head: Normocephalic and atraumatic.  Eyes: Pupils are equal, round, and reactive to light. Conjunctivae, EOM and  lids are normal.  Neck: Normal range of motion. Neck supple. No tracheal deviation present. No thyroid mass present.  Cardiovascular: Normal rate, regular rhythm and normal heart sounds. Exam reveals no gallop.  No murmur heard. Pulmonary/Chest: Effort normal and breath sounds normal. No stridor. No respiratory distress. She has no decreased breath sounds. She has no wheezes. She has no rhonchi. She has no rales.  Abdominal: Soft. Normal appearance and bowel sounds are normal. She exhibits no distension. There is no tenderness. There is no rebound and no CVA tenderness.  Musculoskeletal: Normal range of motion. She exhibits no edema or tenderness.  Neurological: She is alert and oriented to person, place, and time. She has normal strength. She displays no tremor. No cranial nerve deficit or sensory deficit. Coordination and gait normal. GCS eye subscore is 4. GCS verbal subscore is 5. GCS motor subscore is 6.  Sensation is normal in upper and lower extremities  Skin: Skin is warm and dry. No abrasion and no rash noted.  Psychiatric: She has a normal mood and affect. Her speech is normal and behavior is normal.  Nursing note and vitals reviewed.    ED Treatments / Results  Labs (all labs ordered are listed, but only abnormal results are displayed) Labs Reviewed  CBC WITH DIFFERENTIAL/PLATELET  BASIC METABOLIC PANEL    EKG None  Radiology No results found.  Procedures Procedures (including critical care time)  Medications Ordered in ED Medications - No data to display   Initial Impression / Assessment and Plan / ED Course  I have reviewed the triage vital signs and the nursing notes.  Pertinent labs & imaging results that were available during my care of the patient were reviewed by me and considered in my medical decision making (see chart for details).     Head CT with questionable MS and recommended MRI which showed acute stroke.  Discussed with Dr. Wilford Corner from neurology  who wants patient admitted to [facility] for further management  Final Clinical Impressions(s) / ED Diagnoses   Final diagnoses:  None    ED Discharge Orders    None       Lorre Nick, MD 06/22/18 1320

## 2018-06-22 NOTE — ED Triage Notes (Signed)
Patient reportst hat she is having right arm and right leg tingling. Patient states when she got up from the bed early this AM she began having a tight feeling down her right leg.

## 2018-06-23 ENCOUNTER — Inpatient Hospital Stay (HOSPITAL_COMMUNITY): Payer: BLUE CROSS/BLUE SHIELD

## 2018-06-23 DIAGNOSIS — I351 Nonrheumatic aortic (valve) insufficiency: Secondary | ICD-10-CM

## 2018-06-23 DIAGNOSIS — I639 Cerebral infarction, unspecified: Secondary | ICD-10-CM

## 2018-06-23 DIAGNOSIS — I10 Essential (primary) hypertension: Secondary | ICD-10-CM

## 2018-06-23 DIAGNOSIS — I63312 Cerebral infarction due to thrombosis of left middle cerebral artery: Secondary | ICD-10-CM

## 2018-06-23 DIAGNOSIS — Z716 Tobacco abuse counseling: Secondary | ICD-10-CM

## 2018-06-23 DIAGNOSIS — E785 Hyperlipidemia, unspecified: Secondary | ICD-10-CM

## 2018-06-23 LAB — LIPID PANEL
CHOLESTEROL: 248 mg/dL — AB (ref 0–200)
HDL: 48 mg/dL (ref >40)
LDL Cholesterol: 178 mg/dL — ABNORMAL HIGH (ref 0–99)
Total CHOL/HDL Ratio: 5.2 RATIO
Triglycerides: 111 mg/dL (ref <150)
VLDL: 22 mg/dL (ref 0–40)

## 2018-06-23 LAB — HEMOGLOBIN A1C
Hgb A1c MFr Bld: 5.8 % — ABNORMAL HIGH (ref 4.8–5.6)
MEAN PLASMA GLUCOSE: 119.76 mg/dL

## 2018-06-23 LAB — ECHOCARDIOGRAM COMPLETE
Height: 65 in
WEIGHTICAEL: 3024 [oz_av]

## 2018-06-23 LAB — HIV ANTIBODY (ROUTINE TESTING W REFLEX): HIV SCREEN 4TH GENERATION: NONREACTIVE

## 2018-06-23 MED ORDER — IOPAMIDOL (ISOVUE-300) INJECTION 61%
INTRAVENOUS | Status: AC
  Filled 2018-06-23: qty 30

## 2018-06-23 MED ORDER — CLOPIDOGREL BISULFATE 75 MG PO TABS
75.0000 mg | ORAL_TABLET | Freq: Every day | ORAL | Status: DC
Start: 2018-06-23 — End: 2018-06-24
  Administered 2018-06-23 – 2018-06-24 (×2): 75 mg via ORAL
  Filled 2018-06-23 (×3): qty 1

## 2018-06-23 MED ORDER — NICOTINE 7 MG/24HR TD PT24
7.0000 mg | MEDICATED_PATCH | Freq: Every day | TRANSDERMAL | Status: DC
  Administered 2018-06-23: 7 mg via TRANSDERMAL
  Filled 2018-06-23 (×2): qty 1

## 2018-06-23 MED ORDER — IOPAMIDOL (ISOVUE-370) INJECTION 76%
INTRAVENOUS | Status: AC
  Administered 2018-06-23: 50 mL
  Filled 2018-06-23: qty 50

## 2018-06-23 MED ORDER — ASPIRIN EC 81 MG PO TBEC
81.0000 mg | DELAYED_RELEASE_TABLET | Freq: Every day | ORAL | Status: DC
  Administered 2018-06-23 – 2018-06-24 (×2): 81 mg via ORAL
  Filled 2018-06-23 (×2): qty 1

## 2018-06-23 NOTE — Consult Note (Signed)
Referring Physician: Dr. Ree Kida    Chief Complaint: Right sided numbness and tingling  HPI: Catherine Gross is an 47 y.o. female who presented to the [facility] ED on Tuesday morning with new onset of right arm and right leg tingling. Symptoms first noticed as a tight feeling down her right leg. The symptoms had been ongoing for over 24 hours at the time of presentation. She continues to have the sensory disturbance, but it has gradually improved since admission. She denies vision changes, confusion, speech changes, facial droop, dysphagia, dysarthria, limb weakness, ataxia or gait dysfunction.   MRI was obtained, revealing an acute lacunar infarct in the posterior limb of the left internal capsule/lateral thalamus. Also noted was markedly age advanced chronic small vessel ischemic disease with multiple chronic lacunar infarcts.  She was transferred to [facility] for further stroke work up.   Stroke risk factors: HTN, cardiomyopathy.   Past Medical History:  Diagnosis Date  . Benign essential HTN   . Cardiomyopathy ([place])   . Mitral regurgitation   . Preeclampsia    first pregnancy, not with susequent pregnancy    Past Surgical History:  Procedure Laterality Date  . CESAREAN SECTION    . RIGHT/LEFT HEART CATH AND CORONARY ANGIOGRAPHY N/A 09/02/2017   Procedure: RIGHT/LEFT HEART CATH AND CORONARY ANGIOGRAPHY;  Surgeon: Larey Dresser, MD;  Location: [place] CV LAB;  Service: Cardiovascular;  Laterality: N/A;  . TEE WITHOUT CARDIOVERSION N/A 09/02/2017   Procedure: TRANSESOPHAGEAL ECHOCARDIOGRAM (TEE);  Surgeon: Larey Dresser, MD;  Location: [facility] ENDOSCOPY;  Service: Cardiovascular;  Laterality: N/A;    Family History  Problem Relation Age of Onset  . Heart failure Father   . Hypertension Father   . Congestive Heart Failure Father   . Alzheimer's disease Mother   . Asthma Brother    Social History:  reports that she quit smoking about 3 years ago. She has a 10.00 pack-year smoking history.  She has quit using smokeless tobacco. She reports that she does not drink alcohol or use drugs.  Allergies: No Known Allergies  Medications:  Prior to Admission:  Medications Prior to Admission  Medication Sig Dispense Refill Last Dose  . carvedilol (COREG) 25 MG tablet Take 1 tablet (25 mg total) by mouth 2 (two) times daily with a meal. 180 tablet 3 06/21/2018 at Unknown time  . furosemide (LASIX) 40 MG tablet Take 1 tablet (40 mg total) by mouth daily. 90 tablet 1 06/21/2018 at Unknown time  . hydrALAZINE (APRESOLINE) 25 MG tablet Take 1 tablet (25 mg total) by mouth 3 (three) times daily. 270 tablet 1 06/22/2018 at Unknown time  . ibuprofen (ADVIL,MOTRIN) 200 MG tablet Take 800 mg by mouth every 8 (eight) hours as needed (for pain).   Past Week at Unknown time  . potassium chloride SA (K-DUR,KLOR-CON) 20 MEQ tablet Take 1 tablet (20 mEq total) by mouth 2 (two) times daily. 180 tablet 1 06/21/2018 at Unknown time  . sacubitril-valsartan (ENTRESTO) 97-103 MG Take 1 tablet by mouth 2 (two) times daily. 180 tablet 3 Past Week at Unknown time  . simvastatin (ZOCOR) 20 MG tablet Take 1 tablet (20 mg total) by mouth at bedtime. 90 tablet 1 Past Week at Unknown time   Current Facility-Administered Medications:  .  acetaminophen (TYLENOL) tablet 650 mg, 650 mg, Oral, Q4H PRN **OR** acetaminophen (TYLENOL) solution 650 mg, 650 mg, Per Tube, Q4H PRN **OR** acetaminophen (TYLENOL) suppository 650 mg, 650 mg, Rectal, Q4H PRN, Cristal Ford, DO .  aspirin  suppository 300 mg, 300 mg, Rectal, Daily **OR** aspirin tablet 325 mg, 325 mg, Oral, Daily, Cristal Ford, DO, 325 mg at 06/22/18 2153 .  atorvastatin (LIPITOR) tablet 80 mg, 80 mg, Oral, q1800, Mikhail, Maryann, DO .  enoxaparin (LOVENOX) injection 40 mg, 40 mg, Subcutaneous, Q24H, Mikhail, [place], DO, 40 mg at 06/22/18 2153 .  senna-docusate (Senokot-S) tablet 1 tablet, 1 tablet, Oral, QHS PRN, Mikhail, Velta Addison, DO .  simethicone (MYLICON)  chewable tablet 160 mg, 160 mg, Oral, QID PRN, Schorr, Rhetta Mura, NP, 160 mg at 06/22/18 2153   ROS: No headache at this time, but did have initially when numbness was first noticed.   Physical Examination: Blood pressure 135/86, pulse 79, temperature 98.2 F (36.8 C), temperature source Oral, resp. rate 17, height '5\' 5"'  (1.651 m), weight 85.7 kg (189 lb), last menstrual period 05/11/2018, SpO2 98 %.  HEENT: [place]/AT Lungs: Respirations unlabored Ext: No edema  Neurologic Examination: Mental Status: Alert, oriented, thought content appropriate.  Speech fluent without evidence of aphasia.  Able to follow all commands without difficulty. Cranial Nerves: II:  Visual fields intact without extinction to DSS. PERRL.  III,IV, VI: ptosis not present, EOMI without nystagmus V,VII: Subtle right facial droop. Facial temp sensation subjectively normal bilaterally VIII: hearing intact to conversation IX,X: no hypophonia XI: Symmetric XII: midline tongue extension  Motor: Right : Upper extremity   5/5    Left:     Upper extremity   5/5  Lower extremity   5/5     Lower extremity   5/5 Normal tone throughout; no atrophy noted Sensory: Temp and light touch subjectively normal on right and left, upper and lower extremity. Paresthesias are symptomatic on the right during the exam. No extinction.  Deep Tendon Reflexes:  2+ brachioradialis bilaterally.  2+ patellae bilaterally.  1+ achilles bilaterally.  Plantars: Right: downgoing   Left: downgoing Cerebellar: No ataxia with FNF or H-S bilaterally  Gait: Deferred  Results for orders placed or performed during the hospital encounter of 06/22/18 (from the past 48 hour(s))  CBC with Differential/Platelet     Status: Abnormal   Collection Time: 06/22/18  9:28 AM  Result Value Ref Range   WBC 4.3 4.0 - 10.5 K/uL   RBC 4.04 3.87 - 5.11 MIL/uL   Hemoglobin 10.8 (L) 12.0 - 15.0 g/dL   HCT 34.2 (L) 36.0 - 46.0 %   MCV 84.7 78.0 - 100.0 fL   MCH 26.7  26.0 - 34.0 pg   MCHC 31.6 30.0 - 36.0 g/dL   RDW 15.9 (H) 11.5 - 15.5 %   Platelets 278 150 - 400 K/uL   Neutrophils Relative % 58 %   Neutro Abs 2.5 1.7 - 7.7 K/uL   Lymphocytes Relative 32 %   Lymphs Abs 1.4 0.7 - 4.0 K/uL   Monocytes Relative 8 %   Monocytes Absolute 0.4 0.1 - 1.0 K/uL   Eosinophils Relative 2 %   Eosinophils Absolute 0.1 0.0 - 0.7 K/uL   Basophils Relative 0 %   Basophils Absolute 0.0 0.0 - 0.1 K/uL    Comment: Performed at [facility], [place] [address], [place], [place] [address]  Basic metabolic panel     Status: None   Collection Time: 06/22/18  9:28 AM  Result Value Ref Range   Sodium 142 135 - 145 mmol/L   Potassium 3.7 3.5 - 5.1 mmol/L   Chloride 108 98 - 111 mmol/L    Comment: Please note change in reference range.  CO2 27 22 - 32 mmol/L   Glucose, Bld 96 70 - 99 mg/dL    Comment: Please note change in reference range.   BUN 14 6 - 20 mg/dL    Comment: Please note change in reference range.   Creatinine, Ser 0.78 0.44 - 1.00 mg/dL   Calcium 9.3 8.9 - 10.3 mg/dL   GFR calc non Af Amer >60 >60 mL/min   GFR calc Af Amer >60 >60 mL/min    Comment: (NOTE) The eGFR has been calculated using the CKD EPI equation. This calculation has not been validated in all clinical situations. eGFR's persistently <60 mL/min signify possible Chronic Kidney Disease.    Anion gap 7 5 - 15    Comment: Performed at [facility], [place] [address], [place], [place] [address]  Protime-INR     Status: None   Collection Time: 06/22/18  9:28 AM  Result Value Ref Range   Prothrombin Time 13.1 11.4 - 15.2 seconds   INR 1.00     Comment: Performed at [facility], [place] [address] [place], [place] [address]  APTT     Status: None   Collection Time: 06/22/18  9:28 AM  Result Value Ref Range   aPTT 31 24 - 36 seconds    Comment: Performed at [facility], [place] [address], [place], [place] [address]   I-stat troponin, ED     Status: None   Collection Time: 06/22/18  1:32 PM  Result Value Ref Range   Troponin i, poc 0.00 0.00 - 0.08 ng/mL   Comment 3            Comment: Due to the release kinetics of cTnI, a negative result within the first hours of the onset of symptoms does not rule out myocardial infarction with certainty. If myocardial infarction is still suspected, repeat the test at appropriate intervals.   I-Stat beta hCG blood, ED     Status: None   Collection Time: 06/22/18  1:33 PM  Result Value Ref Range   I-stat hCG, quantitative <5.0 <5 mIU/mL   Comment 3            Comment:   GEST. AGE      CONC.  (mIU/mL)   <=1 WEEK        5 - 50     2 WEEKS       50 - 500     3 WEEKS       100 - 10,000     4 WEEKS     1,000 - 30,000        FEMALE AND NON-PREGNANT FEMALE:     LESS THAN 5 mIU/mL   Urine rapid drug screen (hosp performed)     Status: Abnormal   Collection Time: 06/22/18  2:55 PM  Result Value Ref Range   Opiates NONE DETECTED NONE DETECTED   Cocaine NONE DETECTED NONE DETECTED   Benzodiazepines NONE DETECTED NONE DETECTED   Amphetamines NONE DETECTED NONE DETECTED   Tetrahydrocannabinol NONE DETECTED NONE DETECTED   Barbiturates (A) NONE DETECTED    Result not available. Reagent lot number recalled by manufacturer.    Comment: Performed at [facility], [place] [address], [place],  [address]  Urinalysis, Routine w reflex microscopic     Status: Abnormal   Collection Time: 06/22/18  2:55 PM  Result Value Ref Range   Color, Urine YELLOW YELLOW   APPearance HAZY (A) CLEAR  Specific Gravity, Urine 1.009 1.005 - 1.030   pH 7.0 5.0 - 8.0   Glucose, UA NEGATIVE NEGATIVE mg/dL   Hgb urine dipstick NEGATIVE NEGATIVE   Bilirubin Urine NEGATIVE NEGATIVE   Ketones, ur NEGATIVE NEGATIVE mg/dL   Protein, ur NEGATIVE NEGATIVE mg/dL   Nitrite NEGATIVE NEGATIVE   Leukocytes, UA LARGE (A) NEGATIVE   RBC / HPF 11-20 0 - 5 RBC/hpf   WBC, UA 0-5 0  - 5 WBC/hpf   Bacteria, UA RARE (A) NONE SEEN   Squamous Epithelial / LPF 11-20 0 - 5   Mucus PRESENT     Comment: Performed at [facility], [place] [address], [place], [place] [address]   Dg Chest 2 View  Result Date: 06/22/2018 CLINICAL DATA:  Cardiomyopathy with mitral regurgitation. Leg numbness with suspected stroke. Former smoker. EXAM: CHEST - 2 VIEW COMPARISON:  03/26/2015 FINDINGS: Mild cardiomegaly. Mediastinal contours are within normal limits with minimal aortic atherosclerosis. Possible smoking related bronchitic change of the lungs with increased interstitial lung markings and mild peribronchial thickening. No alveolar consolidation or CHF. No effusion. The visualized skeletal structures are unremarkable. IMPRESSION: Possible chronic smoking related bronchitic change of the lungs. No active pulmonary disease. Electronically Signed   By: Ashley Royalty M.D.   On: 06/22/2018 23:26   Ct Head Wo Contrast  Result Date: 06/22/2018 CLINICAL DATA:  Right arm and leg tingling. EXAM: CT HEAD WITHOUT CONTRAST TECHNIQUE: Contiguous axial images were obtained from the base of the skull through the vertex without intravenous contrast. COMPARISON:  None. FINDINGS: Brain: Nonspecific low density within the deep white matter. No acute infarction, hemorrhage or hydrocephalus. Vascular: No hyperdense vessel or unexpected calcification. Skull: No acute calvarial abnormality. Sinuses/Orbits: Visualized paranasal sinuses and mastoids clear. Orbital soft tissues unremarkable. Other: None IMPRESSION: Nonspecific low-density throughout the deep white matter. This could reflect chronic microvascular ischemic changes or other demyelinating process such as vasculitis or multiple sclerosis. This could be further evaluated with MRI if felt clinically indicated. Electronically Signed   By: Rolm Baptise M.D.   On: 06/22/2018 09:49   Mr Brain Wo Contrast (neuro Protocol)  Result Date: 06/22/2018 CLINICAL  DATA:  Right arm and leg tingling. EXAM: MRI HEAD WITHOUT CONTRAST TECHNIQUE: Multiplanar, multiecho pulse sequences of the brain and surrounding structures were obtained without intravenous contrast. COMPARISON:  Head CT 06/22/2018 FINDINGS: Brain: There is an acute 6 mm infarct involving the posterior limb of the left internal capsule/lateral margin of the left thalamus. There is a chronic lacunar infarct just superior to this involving the posterior aspect of the left thalamus and adjacent white matter. Chronic lacunar infarcts are also present in the right internal capsule and bilateral cerebellum. Patchy T2 hyperintensities throughout the subcortical and deep cerebral white matter and pons are nonspecific but compatible with moderate chronic small vessel ischemic disease, markedly advanced for age. The ventricles and sulci are normal. No intracranial hemorrhage, mass, midline shift, or extra-axial fluid collection is identified. Vascular: Major intracranial vascular flow voids are preserved. Skull and upper cervical spine: Unremarkable bone marrow signal. Sinuses/Orbits: Unremarkable orbits. Paranasal sinuses and mastoid air cells are clear. Other: None. IMPRESSION: 1. Acute lacunar infarct in the posterior limb of the left internal capsule/lateral thalamus. 2. Markedly age advanced chronic small vessel ischemic disease with multiple chronic lacunar infarcts as above. Electronically Signed   By: Logan Bores M.D.   On: 06/22/2018 12:45    Assessment: 47 y.o. female with acute lacunar infarct in the posterior limb  of the left internal capsule/lateral thalamus. 1. MRI also reveals markedly age advanced chronic small vessel ischemic disease with multiple chronic lacunar infarcts 2. Exam nonfocal except for subtle right facial droop. Symptomatic right sided paresthesias but no sensory loss on exam.  3. Stroke Risk Factors - HTN, cardiomyopathy.   Plan: 1. HgbA1c, fasting lipid panel 2. MRA of the brain  without contrast 3. PT consult, OT consult, Speech consult 4. Echocardiogram 5. Carotid dopplers 6. Has been started on ASA and atorvastatin 7. Risk factor modification 8. Telemetry monitoring 9. Frequent neuro checks  '@Electronically'  signed: Dr. Kerney Elbe  06/23/2018, 2:24 AM

## 2018-06-23 NOTE — Care Management Note (Signed)
Case Management Note  Patient Details  Name: Shamikia Juhas MRN: [id number] Date of Birth: 17-Apr-1971  Subjective/Objective:                 Patient from home w spouse. Admitted for stroke work up, no HH recs, no other CM needs identified.    Action/Plan:   Expected Discharge Date:                  Expected Discharge Plan:  Home/Self Care  In-House Referral:     Discharge planning Services  CM Consult  Post Acute Care Choice:    Choice offered to:     DME Arranged:    DME Agency:     HH Arranged:    HH Agency:     Status of Service:  Completed, signed off  If discussed at [employer] of Stay Meetings, dates discussed:    Additional Comments:  Lawerance Sabal, RN 06/23/2018, 5:26 PM

## 2018-06-23 NOTE — Progress Notes (Signed)
  Echocardiogram 2D Echocardiogram has been performed.  Tye Savoy 06/23/2018, 2:08 PM

## 2018-06-23 NOTE — Progress Notes (Addendum)
STROKE TEAM PROGRESS NOTE   INTERVAL HISTORY Her RN is at the bedside.  Patient still with some abnormal sensation on the top of her knee down her shin, otherwise resolved.  Vitals:   06/23/18 0259 06/23/18 0300 06/23/18 0459 06/23/18 0815  BP: 129/74  140/85 (!) 141/83  Pulse: 80  74 74  Resp: 12   18  Temp:  98.1 F (36.7 C) 98.1 F (36.7 C) 98.2 F (36.8 C)  TempSrc:   Oral Oral  SpO2: 98%  98% 100%  Weight:      Height:        CBC:  Recent Labs  Lab 06/22/18 0928  WBC 4.3  NEUTROABS 2.5  HGB 10.8*  HCT 34.2*  MCV 84.7  PLT 278    Basic Metabolic Panel:  Recent Labs  Lab 06/22/18 0928  NA 142  K 3.7  CL 108  CO2 27  GLUCOSE 96  BUN 14  CREATININE 0.78  CALCIUM 9.3   Lipid Panel:     Component Value Date/Time   CHOL 248 (H) 06/23/2018 0421   CHOL 244 (H) 05/19/2018 1024   TRIG 111 06/23/2018 0421   HDL 48 06/23/2018 0421   HDL 40 05/19/2018 1024   CHOLHDL 5.2 06/23/2018 0421   VLDL 22 06/23/2018 0421   LDLCALC 178 (H) 06/23/2018 0421   LDLCALC 186 (H) 05/19/2018 1024   HgbA1c:  Lab Results  Component Value Date   HGBA1C 5.8 (H) 06/23/2018   Urine Drug Screen:     Component Value Date/Time   LABOPIA NONE DETECTED 06/22/2018 1455   COCAINSCRNUR NONE DETECTED 06/22/2018 1455   LABBENZ NONE DETECTED 06/22/2018 1455   AMPHETMU NONE DETECTED 06/22/2018 1455   THCU NONE DETECTED 06/22/2018 1455   LABBARB (A) 06/22/2018 1455    Result not available. Reagent lot number recalled by manufacturer.    Alcohol Level No results found for: [facility]  IMAGING Dg Chest 2 View  Result Date: 06/22/2018 CLINICAL DATA:  Cardiomyopathy with mitral regurgitation. Leg numbness with suspected stroke. Former smoker. EXAM: CHEST - 2 VIEW COMPARISON:  03/26/2015 FINDINGS: Mild cardiomegaly. Mediastinal contours are within normal limits with minimal aortic atherosclerosis. Possible smoking related bronchitic change of the lungs with increased interstitial lung markings  and mild peribronchial thickening. No alveolar consolidation or CHF. No effusion. The visualized skeletal structures are unremarkable. IMPRESSION: Possible chronic smoking related bronchitic change of the lungs. No active pulmonary disease. Electronically Signed   By: Tollie Eth M.D.   On: 06/22/2018 23:26   Ct Head Wo Contrast  Result Date: 06/22/2018 CLINICAL DATA:  Right arm and leg tingling. EXAM: CT HEAD WITHOUT CONTRAST TECHNIQUE: Contiguous axial images were obtained from the base of the skull through the vertex without intravenous contrast. COMPARISON:  None. FINDINGS: Brain: Nonspecific low density within the deep white matter. No acute infarction, hemorrhage or hydrocephalus. Vascular: No hyperdense vessel or unexpected calcification. Skull: No acute calvarial abnormality. Sinuses/Orbits: Visualized paranasal sinuses and mastoids clear. Orbital soft tissues unremarkable. Other: None IMPRESSION: Nonspecific low-density throughout the deep white matter. This could reflect chronic microvascular ischemic changes or other demyelinating process such as vasculitis or multiple sclerosis. This could be further evaluated with MRI if felt clinically indicated. Electronically Signed   By: Charlett Nose M.D.   On: 06/22/2018 09:49   Mr Brain Wo Contrast (neuro Protocol)  Result Date: 06/22/2018 CLINICAL DATA:  Right arm and leg tingling. EXAM: MRI HEAD WITHOUT CONTRAST TECHNIQUE: Multiplanar, multiecho pulse sequences of the brain  and surrounding structures were obtained without intravenous contrast. COMPARISON:  Head CT 06/22/2018 FINDINGS: Brain: There is an acute 6 mm infarct involving the posterior limb of the left internal capsule/lateral margin of the left thalamus. There is a chronic lacunar infarct just superior to this involving the posterior aspect of the left thalamus and adjacent white matter. Chronic lacunar infarcts are also present in the right internal capsule and bilateral cerebellum. Patchy T2  hyperintensities throughout the subcortical and deep cerebral white matter and pons are nonspecific but compatible with moderate chronic small vessel ischemic disease, markedly advanced for age. The ventricles and sulci are normal. No intracranial hemorrhage, mass, midline shift, or extra-axial fluid collection is identified. Vascular: Major intracranial vascular flow voids are preserved. Skull and upper cervical spine: Unremarkable bone marrow signal. Sinuses/Orbits: Unremarkable orbits. Paranasal sinuses and mastoid air cells are clear. Other: None. IMPRESSION: 1. Acute lacunar infarct in the posterior limb of the left internal capsule/lateral thalamus. 2. Markedly age advanced chronic small vessel ischemic disease with multiple chronic lacunar infarcts as above. Electronically Signed   By: Sebastian Ache M.D.   On: 06/22/2018 12:45    PHYSICAL EXAM HEENT: [place]/AT Lungs: Respirations unlabored Ext: No edema  Neurologic Examination: Mental Status: Alert, oriented, thought content appropriate.  Speech fluent without evidence of aphasia.  Able to follow all commands without difficulty. Cranial Nerves: II:  Visual fields intact without extinction to DSS. PERRL.  III,IV, VI: ptosis not present, EOMI without nystagmus V,VII: Subtle right facial droop. Facial temp sensation subjectively normal bilaterally VIII: hearing intact to conversation IX,X: no hypophonia XI: Symmetric XII: midline tongue extension  Motor: Right :  Upper extremity   5/5                                      Left:     Upper extremity   5/5             Lower extremity   5/5                                                  Lower extremity   5/5 Normal tone throughout; no atrophy noted Sensory: Temp and light touch subjectively normal on right and left, upper and lower extremity. Paresthesias are symptomatic on the right knee/shin/toes. Normal R arm and thing. No extinction.  Plantars: Right: downgoing                           Left:  downgoing Cerebellar: No ataxia with FNF or H-S bilaterally  Gait: Deferred   ASSESSMENT/PLAN Ms. Janya Eveland is a 47 y.o. female with history of HTN and CM presenting with R arm tingling.   Stroke:  left thalamic infarct secondary to small vessel disease source  CT head nonspecific density deep white matter  MRI  L IC/thalamic lacunar infarct. Small vessel disease. Atrophy. Mult old lacunar infarcts  CTA head & neck advanced atherosclerosis for age. B cervical ICAs w beaded appearance (FMD). Fusiform aneurysmal L ICA bulb and L ECA trunk  2D Echo  pending   LDL 178  HgbA1c 5.8  Lovenox 40 mg sq daily for VTE prophylaxis  No antithrombotic prior to admission, now on aspirin 81 mg  daily and clopidogrel 75 mg daily. Given mild stroke, will place on aspirin 81 mg and plavix 75 mg daily x 3 weeks, then aspirin alone.   Therapy recommendations:  pending   Disposition:  pending   Hypertension  Stable . Permissive hypertension (OK if < 220/120) but gradually normalize in 5-7 days . Long-term BP goal normotensive  Hyperlipidemia  Home meds:  zocor 20  Now on lipitor 80  LDL 178, goal < 70  Continue statin at discharge  Other Stroke Risk Factors  Former Cigarette smoker  Obesity, Body mass index is 31.45 kg/m., recommend weight loss, diet and exercise as appropriate   Hx combined systolic and diastolic CHF  Other Active Problems  Chronic normocytic anemia  Hospital day # 1  Annie Main, MSN, APRN, ANVP-BC, AGPCNP-BC Advanced Practice Stroke Nurse [facility] See Amion for Schedule & Pager information 06/23/2018 10:39 AM   ATTENDING NOTE: I reviewed above note and agree with the assessment and plan. I have made any additions or clarifications directly to the above note. Pt was seen and examined.   47 year old female with history hypertension, cardiomyopathy admitted for right arm leg tingling.  MRI showed left thalamic/internal capsule small  infarct.  However, there are old bilateral cerebellum, right BG, left thalamus and left CR old infarcts. All consistent with small vessel disease.  LDL 178 and A1c 5.8.  CTA head neck showed diffuse atherosclerosis including aortic arch, right cervical carotid artery and bilateral ICA siphons.  Also bilateral ICA proximal fibromuscular dysplasia appearance including ectasia/fusiform aneurysm left ICA bulb and left ECA trunk.  EF 55 to 60%.  UDS negative.  Patient stroke consistent with small vessel disease given locations and her stroke risk factors.  Currently on aspirin 81 and Plavix 75.  Continue DAPT for 3 weeks and then either aspirin or Plavix alone.  Continue Lipitor 80 for stroke prevention.  PT/OT no recommendation.  Continue abstinence from smoking.  Neurology will sign off. Please call with questions. Pt will follow up with stroke clinic NP at [facility] in about 4 weeks. Thanks for the consult.   Marvel Plan, MD PhD Stroke Neurology 06/23/2018 9:09 PM     To contact Stroke Continuity provider, please refer to [email or web address]. After hours, contact General Neurology

## 2018-06-23 NOTE — Progress Notes (Signed)
PROGRESS NOTE                                                                                                                                                                                                             Patient Demographics:    Catherine Gross, is a 47 y.o. female, DOB - 13-Oct-1971, FTD:[id number]  Admit date - 06/22/2018   Admitting Physician Edsel Petrin, DO  Outpatient Primary MD for the patient is Dessa Phi, MD  LOS - 1  Outpatient Specialists: none  Chief Complaint  Patient presents with  . Numbness       Brief Narrative  47 year old female with history of hypertension, systolic CHF with normal EF on cardiac cath from 08/2017, mitral regurgitation, ongoing tobacco use who presented to the ED with acute onset of right upper and lower extremity numbness.  Head CT showed chronic microvascular ischemic changes.  MRI of the brain showed acute lacunar infarct in the posterior limb of the left internal capsule/lateral thalamus. Patient initially presented to [facility] hospital and was transferred to [facility] for further stroke work-up.    Subjective:   Patient this morning reports some numbness in her right lower leg.  Right upper extremity numbness has resolved.  No new weakness.  No speech impairment.   Assessment  & Plan :   Principal problem Acute ischemic stroke Acute lacunar infarct in the left internal capsule/lateral thalamus.  Risk factors include hypertension, cardiomyopathy and ongoing tobacco use. Neuro hospitalist consult appreciated.  Obtain CT angiogram of the neck, 2D echo.  LDL of 178.  Aspirin and statin.  Needs aggressive secondary risk factor modification. Counseled strongly on smoking cessation and ordered nicotine patch. Also has prediabetes with A1c of 5.8. PT/OT eval.  Further recommendation per stroke service.  Essential hypertension Holding home meds to allow  permissive blood pressure.  Hyperlipidemia LDL of 178.  Was on Zocor 20 mg daily and placed on Lipitor 80 mg.  History of cardiomyopathy/combined systolic and diastolic CHF Currently euvolemic.  Follow 2D echo.  Monitor strict I's/O and daily weight.  Chronic normocytic anemia Hemoglobin stable.   Tobacco abuse Counseled strongly on cessation.  Nicotine patch applied  Code Status : Full code  Family Communication  : None at bedside  Disposition Plan  : Pending further work-up (echo, CT angiogram, PT/OT eval and  further stroke team recommendation).  Home with home health versus SNF on 7/18  Barriers For Discharge : Active symptoms  Consults  : Stroke  Procedures  : CT head, MRI brain, 2D echo, CT angiogram of the head and neck  DVT Prophylaxis  :  Lovenox -   Lab Results  Component Value Date   PLT 278 06/22/2018    Antibiotics  :    Anti-infectives (From admission, onward)   None        Objective:   Vitals:   06/23/18 0259 06/23/18 0300 06/23/18 0459 06/23/18 0815  BP: 129/74  140/85 (!) 141/83  Pulse: 80  74 74  Resp: 12   18  Temp:  98.1 F (36.7 C) 98.1 F (36.7 C) 98.2 F (36.8 C)  TempSrc:   Oral Oral  SpO2: 98%  98% 100%  Weight:      Height:        Wt Readings from Last 3 Encounters:  06/22/18 85.7 kg (189 lb)  05/19/18 86.2 kg (190 lb)  12/09/17 88.4 kg (194 lb 12.8 oz)    No intake or output data in the 24 hours ending 06/23/18 1140   Physical Exam  Gen: not in distress HEENT: moist mucosa, supple neck Chest: clear b/l, no added sounds CVS: N S1&S2, no murmurs, rubs or gallop GI: soft, NT, ND, BS+ Musculoskeletal: warm, no edema CNS: Alert and oriented, normal power and tone in all extremities, residual numbness in the right lower extremity    Data Review:    CBC Recent Labs  Lab 06/22/18 0928  WBC 4.3  HGB 10.8*  HCT 34.2*  PLT 278  MCV 84.7  MCH 26.7  MCHC 31.6  RDW 15.9*  LYMPHSABS 1.4  MONOABS 0.4  EOSABS 0.1    BASOSABS 0.0    Chemistries  Recent Labs  Lab 06/22/18 0928  NA 142  K 3.7  CL 108  CO2 27  GLUCOSE 96  BUN 14  CREATININE 0.78  CALCIUM 9.3   ------------------------------------------------------------------------------------------------------------------ Recent Labs    06/23/18 0421  CHOL 248*  HDL 48  LDLCALC 178*  TRIG 111  CHOLHDL 5.2    Lab Results  Component Value Date   HGBA1C 5.8 (H) 06/23/2018   ------------------------------------------------------------------------------------------------------------------ No results for input(s): TSH, T4TOTAL, T3FREE, THYROIDAB in the last 72 hours.  Invalid input(s): FREET3 ------------------------------------------------------------------------------------------------------------------ No results for input(s): VITAMINB12, FOLATE, FERRITIN, TIBC, IRON, RETICCTPCT in the last 72 hours.  Coagulation profile Recent Labs  Lab 06/22/18 0928  INR 1.00    No results for input(s): DDIMER in the last 72 hours.  Cardiac Enzymes No results for input(s): CKMB, TROPONINI, MYOGLOBIN in the last 168 hours.  Invalid input(s): CK ------------------------------------------------------------------------------------------------------------------    Component Value Date/Time   BNP 373.9 (H) 03/22/2015 0040    Inpatient Medications  Scheduled Meds: . aspirin EC  81 mg Oral Daily  . atorvastatin  80 mg Oral q1800  . clopidogrel  75 mg Oral Daily  . enoxaparin (LOVENOX) injection  40 mg Subcutaneous Q24H  . nicotine  7 mg Transdermal Daily   Continuous Infusions: PRN Meds:.acetaminophen **OR** acetaminophen (TYLENOL) oral liquid 160 mg/5 mL **OR** acetaminophen, senna-docusate, simethicone  Micro Results No results found for this or any previous visit (from the past 240 hour(s)).  Radiology Reports Dg Chest 2 View  Result Date: 06/22/2018 CLINICAL DATA:  Cardiomyopathy with mitral regurgitation. Leg numbness with  suspected stroke. Former smoker. EXAM: CHEST - 2 VIEW COMPARISON:  03/26/2015 FINDINGS: Mild  cardiomegaly. Mediastinal contours are within normal limits with minimal aortic atherosclerosis. Possible smoking related bronchitic change of the lungs with increased interstitial lung markings and mild peribronchial thickening. No alveolar consolidation or CHF. No effusion. The visualized skeletal structures are unremarkable. IMPRESSION: Possible chronic smoking related bronchitic change of the lungs. No active pulmonary disease. Electronically Signed   By: Tollie Eth M.D.   On: 06/22/2018 23:26   Ct Head Wo Contrast  Result Date: 06/22/2018 CLINICAL DATA:  Right arm and leg tingling. EXAM: CT HEAD WITHOUT CONTRAST TECHNIQUE: Contiguous axial images were obtained from the base of the skull through the vertex without intravenous contrast. COMPARISON:  None. FINDINGS: Brain: Nonspecific low density within the deep white matter. No acute infarction, hemorrhage or hydrocephalus. Vascular: No hyperdense vessel or unexpected calcification. Skull: No acute calvarial abnormality. Sinuses/Orbits: Visualized paranasal sinuses and mastoids clear. Orbital soft tissues unremarkable. Other: None IMPRESSION: Nonspecific low-density throughout the deep white matter. This could reflect chronic microvascular ischemic changes or other demyelinating process such as vasculitis or multiple sclerosis. This could be further evaluated with MRI if felt clinically indicated. Electronically Signed   By: Charlett Nose M.D.   On: 06/22/2018 09:49   Mr Brain Wo Contrast (neuro Protocol)  Result Date: 06/22/2018 CLINICAL DATA:  Right arm and leg tingling. EXAM: MRI HEAD WITHOUT CONTRAST TECHNIQUE: Multiplanar, multiecho pulse sequences of the brain and surrounding structures were obtained without intravenous contrast. COMPARISON:  Head CT 06/22/2018 FINDINGS: Brain: There is an acute 6 mm infarct involving the posterior limb of the left internal  capsule/lateral margin of the left thalamus. There is a chronic lacunar infarct just superior to this involving the posterior aspect of the left thalamus and adjacent white matter. Chronic lacunar infarcts are also present in the right internal capsule and bilateral cerebellum. Patchy T2 hyperintensities throughout the subcortical and deep cerebral white matter and pons are nonspecific but compatible with moderate chronic small vessel ischemic disease, markedly advanced for age. The ventricles and sulci are normal. No intracranial hemorrhage, mass, midline shift, or extra-axial fluid collection is identified. Vascular: Major intracranial vascular flow voids are preserved. Skull and upper cervical spine: Unremarkable bone marrow signal. Sinuses/Orbits: Unremarkable orbits. Paranasal sinuses and mastoid air cells are clear. Other: None. IMPRESSION: 1. Acute lacunar infarct in the posterior limb of the left internal capsule/lateral thalamus. 2. Markedly age advanced chronic small vessel ischemic disease with multiple chronic lacunar infarcts as above. Electronically Signed   By: Sebastian Ache M.D.   On: 06/22/2018 12:45    Time Spent in minutes  25   Jamile Rekowski M.D on 06/23/2018 at 11:40 AM  Between 7am to 7pm - Pager - [phone]  After 7pm go to [email or web address] - password [facility]  [facility] -  Office  [phone]

## 2018-06-23 NOTE — Evaluation (Signed)
Speech Language Pathology Evaluation Patient Details Name: Catherine Gross MRN: [id number] DOB: 1971/02/29 Today's Date: 06/23/2018 Time: 0910-0920 SLP Time Calculation (min) (ACUTE ONLY): 10 min  Problem List:  Patient Active Problem List   Diagnosis Date Noted  . Acute CVA (cerebrovascular accident) (HCC) 06/22/2018  . Dilated cardiomyopathy (HCC) 04/02/2015  . Systolic heart failure (HCC) 04/02/2015  . Essential hypertension 04/02/2015  . Accelerated hypertension 03/22/2015  . Sinus tachycardia 03/22/2015  . Normocytic anemia    Past Medical History:  Past Medical History:  Diagnosis Date  . Benign essential HTN   . Cardiomyopathy (HCC)   . Mitral regurgitation   . Preeclampsia    first pregnancy, not with susequent pregnancy   Past Surgical History:  Past Surgical History:  Procedure Laterality Date  . CESAREAN SECTION    . RIGHT/LEFT HEART CATH AND CORONARY ANGIOGRAPHY N/A 09/02/2017   Procedure: RIGHT/LEFT HEART CATH AND CORONARY ANGIOGRAPHY;  Surgeon: Laurey Morale, MD;  Location: [facility] INVASIVE CV LAB;  Service: Cardiovascular;  Laterality: N/A;  . TEE WITHOUT CARDIOVERSION N/A 09/02/2017   Procedure: TRANSESOPHAGEAL ECHOCARDIOGRAM (TEE);  Surgeon: Laurey Morale, MD;  Location: [facility] ENDOSCOPY;  Service: Cardiovascular;  Laterality: N/A;   HPI:  Catherine Gross is an 47 y.o. female who presented to the [facility] ED on Tuesday morning with new onset of right arm and right leg tingling. Symptoms first noticed as a tight feeling down her right leg. The symptoms had been ongoing for over 24 hours at the time of presentation. She continues to have the sensory disturbance, but it has gradually improved since admission. She denies vision changes, confusion, speech changes, facial droop, dysphagia, dysarthria, limb weakness, ataxia or gait dysfunction. MRI was obtained, revealing an acute lacunar infarct in the posterior limb of the left internal capsule/lateral thalamus. Also noted was markedly  age advanced chronic small vessel ischemic disease with multiple chronic lacunar infarcts.   Assessment / Plan / Recommendation Clinical Impression  Pt presents with functional cognitive linguistic abilities. She is able to perform higher level tasks without any assistance. Skilled ST is not indicated at this time. ST to sign off.     SLP Assessment  SLP Recommendation/Assessment: Patient does not need any further Speech Lanaguage Pathology Services SLP Visit Diagnosis: Cognitive communication deficit (R41.841)    Follow Up Recommendations  None    Frequency and Duration           SLP Evaluation Cognition  Overall Cognitive Status: Within Functional Limits for tasks assessed Arousal/Alertness: Awake/alert Orientation Level: Oriented X4       Comprehension  Auditory Comprehension Overall Auditory Comprehension: Appears within functional limits for tasks assessed Visual Recognition/Discrimination Discrimination: Within Function Limits Reading Comprehension Reading Status: Within funtional limits    Expression Expression Primary Mode of Expression: Verbal Verbal Expression Overall Verbal Expression: Appears within functional limits for tasks assessed Pragmatics: No impairment Written Expression Dominant Hand: Right Written Expression: Not tested   Oral / Motor  Oral Motor/Sensory Function Overall Oral Motor/Sensory Function: Within functional limits Motor Speech Overall Motor Speech: Appears within functional limits for tasks assessed Respiration: Within functional limits Phonation: Normal Resonance: Within functional limits Articulation: Within functional limitis Intelligibility: Intelligible Motor Planning: Witnin functional limits Motor Speech Errors: Not applicable   GO                    Johnpatrick Jenny 06/23/2018, 10:05 AM

## 2018-06-23 NOTE — Evaluation (Signed)
Occupational Therapy Evaluation Patient Details Name: Catherine Gross MRN: [id number] DOB: 11/01/1971 Today's Date: 06/23/2018    History of Present Illness 47 yo female admitted with R lacunar posterior limb with L internal capsule lateral thalamus.    Clinical Impression   Patient evaluated by Occupational Therapy with no further acute OT needs identified. All education has been completed and the patient has no further questions. See below for any follow-up Occupational Therapy or equipment needs. OT to sign off. Thank you for referral.      Follow Up Recommendations  No OT follow up    Equipment Recommendations  None recommended by OT    Recommendations for Other Services       Precautions / Restrictions Precautions Precautions: None      Mobility Bed Mobility Overal bed mobility: Independent                Transfers Overall transfer level: Independent                    Balance Overall balance assessment: Independent                               Standardized Balance Assessment Standardized Balance Assessment : Dynamic Gait Index   Dynamic Gait Index Level Surface: Normal Change in Gait Speed: Normal Steps: Normal     ADL either performed or assessed with clinical judgement   ADL Overall ADL's : Independent                                             Vision Baseline Vision/History: No visual deficits       Perception     Praxis      Pertinent Vitals/Pain Pain Assessment: No/denies pain     Hand Dominance Left   Extremity/Trunk Assessment Upper Extremity Assessment Upper Extremity Assessment: Overall WFL for tasks assessed   Lower Extremity Assessment Lower Extremity Assessment: Overall WFL for tasks assessed   Cervical / Trunk Assessment Cervical / Trunk Assessment: Normal   Communication Communication Communication: No difficulties   Cognition Arousal/Alertness: Awake/alert Behavior  During Therapy: WFL for tasks assessed/performed Overall Cognitive Status: Within Functional Limits for tasks assessed                                     General Comments       Exercises     Shoulder Instructions      Home Living Family/patient expects to be discharged to:: Private residence Living Arrangements: Spouse/significant other Available Help at Discharge: Family Type of Home: Apartment Home Access: Stairs to enter Secretary/administrator of Steps: 15 Entrance Stairs-Rails: Can reach both Home Layout: One level     Bathroom Shower/Tub: Producer, television/film/video: Standard     Home Equipment: None   Additional Comments: work as [employer] daytime and part time [employer]      Prior Functioning/Environment Level of Independence: Independent        Comments: driving        OT Problem List:        OT Treatment/Interventions:      OT Goals(Current goals can be found in the care plan section) Acute Rehab OT Goals Potential to Achieve Goals:  Good  OT Frequency:     Barriers to D/C:            Co-evaluation              AM-PAC PT "6 Clicks" Daily Activity     Outcome Measure Help from another person eating meals?: None Help from another person taking care of personal grooming?: None Help from another person toileting, which includes using toliet, bedpan, or urinal?: None Help from another person bathing (including washing, rinsing, drying)?: None Help from another person to put on and taking off regular upper body clothing?: None Help from another person to put on and taking off regular lower body clothing?: None 6 Click Score: 24   End of Session Equipment Utilized During Treatment: Gait belt  Activity Tolerance: Patient tolerated treatment well Patient left: in bed;with call bell/phone within reach                   Time: 5176-1607 OT Time Calculation (min): 9 min Charges:  OT General Charges $OT Visit: 1  Visit OT Evaluation $OT Eval Low Complexity: 1 Low G-CodesMateo Flow   OTR/L Pager: [phone] Office: [phone] .   Boone Master B 06/23/2018, 4:11 PM

## 2018-06-23 NOTE — Progress Notes (Signed)
PT Cancellation Note  Patient Details Name: Catherine Gross MRN: [id number] DOB: [birth year]   Cancelled Treatment:    Reason Eval/Treat Not Completed: PT screened, no needs identified, will sign off  Patient seen by OT with PT observing. Ambulating independently. PT speaking with patient reporting feeling as if she is at baseline. No acute PT needs identified at this time. PT to sign off.    Kipp Laurence, PT, DPT 06/23/18 4:00 PM Pager: [phone]

## 2018-06-24 MED ORDER — CLOPIDOGREL BISULFATE 75 MG PO TABS: 75.0000 mg | ORAL_TABLET | Freq: Every day | ORAL | 0 refills | Status: DC

## 2018-06-24 MED ORDER — ATORVASTATIN CALCIUM 80 MG PO TABS: 80.0000 mg | ORAL_TABLET | Freq: Every day | ORAL | 0 refills | Status: DC

## 2018-06-24 MED ORDER — NICOTINE 7 MG/24HR TD PT24: 7.0000 mg | MEDICATED_PATCH | Freq: Every day | TRANSDERMAL | 0 refills | Status: DC

## 2018-06-24 MED ORDER — ASPIRIN 81 MG PO TBEC: 81.0000 mg | DELAYED_RELEASE_TABLET | Freq: Every day | ORAL | 0 refills | Status: AC

## 2018-06-24 NOTE — Discharge Summary (Signed)
Physician Discharge Summary  Catherine Gross ZOX:[id number] DOB: [birth year] DOA: 06/22/2018  PCP: Dessa Phi, MD  Admit date: 06/22/2018 Discharge date: 06/24/2018  Admitted From: Home  Disposition: Home   Recommendations for Outpatient Follow-up:  1. Follow up with PCP in 1-2 weeks 2. Please obtain BMP/CBC in one week    Discharge Condition: stable.  CODE STATUS: full code.  Diet recommendation: Heart Healthy   Brief/Interim Summary:  Brief Narrative  47 year old female with history of hypertension, systolic CHF with normal EF on cardiac cath from 08/2017, mitral regurgitation, ongoing tobacco use who presented to the ED with acute onset of right upper and lower extremity numbness.  Head CT showed chronic microvascular ischemic changes.  MRI of the brain showed acute lacunar infarct in the posterior limb of the left internal capsule/lateral thalamus. Patient initially presented to [facility] long hospital and was transferred to [facility] for further stroke work-up  Acute ischemic stroke Acute lacunar infarct in the left internal capsule/lateral thalamus.  Risk factors include hypertension, cardiomyopathy and ongoing tobacco use. Neuro hospitalist consult appreciated.  Obtain CT angiogram of the neck with plaque, reviewed by neurology. , 2D echo no thrombus. .  LDL of 178. Aspirin and Plavix. For 3 weeks, then aspirin alone. On  statin.  Needs aggressive secondary risk factor modification. Counseling provided to patient.  Counseled strongly on smoking cessation and ordered nicotine patch.  A1c of 5.8. PT/OT eval. Does not required PT.  Stable for discharge    Essential hypertension Resume lasix and carvedilol at discharge   Hyperlipidemia LDL of 178.  Was on Zocor 20 mg daily and placed on Lipitor 80 mg. Provided prescription for lipitor.   History of cardiomyopathy/combined systolic and diastolic CHF Currently euvolemic.  Follow 2D echo.  Monitor strict I's/O and daily  weight. resume lasix and carvedilol at discharge.  Advised to hold entresto. Needs to follow up with HF team, resume entreston if BP allows it.  Holding entresto and hydralazine in setting of acute stroke to avoid hypotension.   Chronic normocytic anemia Hemoglobin stable.   Tobacco abuse Counseled strongly on cessation.  Nicotine patch applied    Discharge Diagnoses:  Active Problems:   Normocytic anemia   Systolic heart failure (HCC)   Essential hypertension   Cerebrovascular accident (CVA) [facility])   Hyperlipidemia    Discharge Instructions  Discharge Instructions    Ambulatory referral to Neurology   Complete by:  As directed    Follow up with stroke clinic NP (Jessica Vanschaick or Darrol Angel, if both not available, consider Manson Allan, or Ahern) at [facility] in about 4 weeks. Thanks.   Diet - low sodium heart healthy   Complete by:  As directed    Increase activity slowly   Complete by:  As directed      Allergies as of 06/24/2018   No Known Allergies     Medication List    STOP taking these medications   hydrALAZINE 25 MG tablet Commonly known as:  APRESOLINE   ibuprofen 200 MG tablet Commonly known as:  ADVIL,MOTRIN   sacubitril-valsartan 97-103 MG Commonly known as:  ENTRESTO   simvastatin 20 MG tablet Commonly known as:  ZOCOR     TAKE these medications   aspirin 81 MG EC tablet Take 1 tablet (81 mg total) by mouth daily. Start taking on:  06/25/2018   atorvastatin 80 MG tablet Commonly known as:  LIPITOR Take 1 tablet (80 mg total) by mouth daily at 6 PM.   carvedilol  25 MG tablet Commonly known as:  COREG Take 1 tablet (25 mg total) by mouth 2 (two) times daily with a meal.   clopidogrel 75 MG tablet Commonly known as:  PLAVIX Take 1 tablet (75 mg total) by mouth daily. Start taking on:  06/25/2018   furosemide 40 MG tablet Commonly known as:  LASIX Take 1 tablet (40 mg total) by mouth daily.   nicotine 7 mg/24hr  patch Commonly known as:  NICODERM CQ - dosed in mg/24 hr Place 1 patch (7 mg total) onto the skin daily. Start taking on:  06/25/2018   potassium chloride SA 20 MEQ tablet Commonly known as:  K-DUR,KLOR-CON Take 1 tablet (20 mEq total) by mouth 2 (two) times daily.      Follow-up Information    [facility]. Schedule an appointment as soon as possible for a visit in 4 week(s).   Specialty:  Radiology Contact information: [address] [place] [address] [phone]       Dessa Phi, MD Follow up in 1 week(s).   Specialty:  Family Medicine Contact information: [address] [place] [address] [phone]          No Known Allergies  Consultations:  Neurology    Procedures/Studies: Ct Angio Head W Or Wo Contrast  Result Date: 06/23/2018 CLINICAL DATA:  47 year old female with an acute lacunar infarct of the posterior limb left internal capsule superimposed on evidence of advanced generalized cerebral small vessel disease on brain MRI yesterday. Patient presented with right extremity tingling. EXAM: CT ANGIOGRAPHY HEAD AND NECK TECHNIQUE: Multidetector CT imaging of the head and neck was performed using the standard protocol during bolus administration of intravenous contrast. Multiplanar CT image reconstructions and MIPs were obtained to evaluate the vascular anatomy. Carotid stenosis measurements (when applicable) are obtained utilizing NASCET criteria, using the distal internal carotid diameter as the denominator. CONTRAST:  50mL ISOVUE-370 IOPAMIDOL (ISOVUE-370) INJECTION 76% COMPARISON:  Brain MRI and noncontrast head CT 06/22/2018 FINDINGS: 9CTA NECK Skeleton: Absent dentition. No acute osseous abnormality identified. Mild cervical spine degeneration. Upper chest: Negative lung apices. No superior mediastinal lymphadenopathy. Other neck: Negative.  No neck mass or lymphadenopathy. Aortic arch: Soft and  calcified arch atherosclerosis. The great vessel origins are relatively spared. Three vessel arch configuration. Right carotid system: No significant right CCA atherosclerosis or stenosis. Soft and calcified plaque at the posterior right ICA origin and bulb but no associated stenosis. There is a beaded and irregular appearance of the right ICA at the skull base seen on series 5, image 75, sagittal image 75, coronal image 110. Questionable faint carotid web in that segment. No stenosis results. Left carotid system: No significant left CCA atherosclerosis or stenosis. Capacious and widely patent left carotid bifurcation with an ectatic, fusiform enlarged left ICA bulb (10-11 millimeters diameter). The left ECA trunk is also dilated/fusiform (8 millimeters diameter). Widely patent left ICA is tortuous but without stenosis or vessel irregularity to the skull base. Vertebral arteries: Minimal proximal right subclavian artery plaque without stenosis. Normal right vertebral artery origin. The right vertebral artery is patent and normal to the skull base. Mild proximal left subclavian artery plaque without stenosis. Normal left vertebral artery origin. Tortuous left V1 segment with a mildly kinked appearance. Mildly non dominant left vertebral artery is otherwise normal to the skull base. CTA HEAD Posterior circulation: Fairly codominant distal vertebral arteries are normal to the vertebrobasilar junction. Both PICA origins are patent. Patent basilar artery without  stenosis. Patent SCA origins. Normal right PCA origin with a fetal left PCA origin. Small right posterior communicating artery is present. Bilateral PCA branches are within normal limits. Anterior circulation: Both ICA siphons are patent and appear normal aside from mild calcified atherosclerosis, greater on the left. Normal ophthalmic artery origins. Normal left posterior communicating artery origin. Normal carotid termini, MCA and ACA origins. Mildly dominant  left ACA. Anterior communicating artery and bilateral ACA branches are within normal limits. Left MCA M1 segment, bifurcation, and left MCA branches are within normal limits. Right MCA M1 segment, trifurcation, and right MCA branches are within normal limits. Venous sinuses: Patent. Anatomic variants: Fetal left PCA origin. Delayed phase: Subtle increased hypodensity at the posterior limb of the left internal capsule since the CT yesterday. Otherwise Stable gray-white matter differentiation throughout the brain; multifocal hypodensity in the white matter, deep gray matter, and chronic right inferior cerebellar infarct. No abnormal enhancement identified. Review of the MIP images confirms the above findings IMPRESSION: 1. Aortic arch, right cervical carotid, and bilateral ICA siphon atherosclerosis seems advanced for age. But there is no arterial stenosis, and no large vessel or circle-of-Willis branch occlusion. 2. Positive for abnormal appearance of both cervical carotid arteries favored to reflect a spectrum of fibromuscular dysplasia (FMD): - irregular beaded appearance plus possible faint carotid web in the right ICA just below the skull base (series 8, image 110). - ectatic/fusiform aneurysmal left ICA bulb and left ECA trunk (up to 11 mm diameter). 3. Negative anterior circulation branches. Negative vertebral arteries and posterior circulation. 4. No new intracranial abnormality. Electronically Signed   By: Odessa Fleming M.D.   On: 06/23/2018 11:43   Dg Chest 2 View  Result Date: 06/22/2018 CLINICAL DATA:  Cardiomyopathy with mitral regurgitation. Leg numbness with suspected stroke. Former smoker. EXAM: CHEST - 2 VIEW COMPARISON:  03/26/2015 FINDINGS: Mild cardiomegaly. Mediastinal contours are within normal limits with minimal aortic atherosclerosis. Possible smoking related bronchitic change of the lungs with increased interstitial lung markings and mild peribronchial thickening. No alveolar consolidation or  CHF. No effusion. The visualized skeletal structures are unremarkable. IMPRESSION: Possible chronic smoking related bronchitic change of the lungs. No active pulmonary disease. Electronically Signed   By: Tollie Eth M.D.   On: 06/22/2018 23:26   Ct Head Wo Contrast  Result Date: 06/22/2018 CLINICAL DATA:  Right arm and leg tingling. EXAM: CT HEAD WITHOUT CONTRAST TECHNIQUE: Contiguous axial images were obtained from the base of the skull through the vertex without intravenous contrast. COMPARISON:  None. FINDINGS: Brain: Nonspecific low density within the deep white matter. No acute infarction, hemorrhage or hydrocephalus. Vascular: No hyperdense vessel or unexpected calcification. Skull: No acute calvarial abnormality. Sinuses/Orbits: Visualized paranasal sinuses and mastoids clear. Orbital soft tissues unremarkable. Other: None IMPRESSION: Nonspecific low-density throughout the deep white matter. This could reflect chronic microvascular ischemic changes or other demyelinating process such as vasculitis or multiple sclerosis. This could be further evaluated with MRI if felt clinically indicated. Electronically Signed   By: Charlett Nose M.D.   On: 06/22/2018 09:49   Ct Angio Neck W Or Wo Contrast  Result Date: 06/23/2018 CLINICAL DATA:  47 year old female with an acute lacunar infarct of the posterior limb left internal capsule superimposed on evidence of advanced generalized cerebral small vessel disease on brain MRI yesterday. Patient presented with right extremity tingling. EXAM: CT ANGIOGRAPHY HEAD AND NECK TECHNIQUE: Multidetector CT imaging of the head and neck was performed using the standard protocol during bolus administration of intravenous  contrast. Multiplanar CT image reconstructions and MIPs were obtained to evaluate the vascular anatomy. Carotid stenosis measurements (when applicable) are obtained utilizing NASCET criteria, using the distal internal carotid diameter as the denominator.  CONTRAST:  106mL ISOVUE-370 IOPAMIDOL (ISOVUE-370) INJECTION 76% COMPARISON:  Brain MRI and noncontrast head CT 06/22/2018 FINDINGS: 9CTA NECK Skeleton: Absent dentition. No acute osseous abnormality identified. Mild cervical spine degeneration. Upper chest: Negative lung apices. No superior mediastinal lymphadenopathy. Other neck: Negative.  No neck mass or lymphadenopathy. Aortic arch: Soft and calcified arch atherosclerosis. The great vessel origins are relatively spared. Three vessel arch configuration. Right carotid system: No significant right CCA atherosclerosis or stenosis. Soft and calcified plaque at the posterior right ICA origin and bulb but no associated stenosis. There is a beaded and irregular appearance of the right ICA at the skull base seen on series 5, image 75, sagittal image 75, coronal image 110. Questionable faint carotid web in that segment. No stenosis results. Left carotid system: No significant left CCA atherosclerosis or stenosis. Capacious and widely patent left carotid bifurcation with an ectatic, fusiform enlarged left ICA bulb (10-11 millimeters diameter). The left ECA trunk is also dilated/fusiform (8 millimeters diameter). Widely patent left ICA is tortuous but without stenosis or vessel irregularity to the skull base. Vertebral arteries: Minimal proximal right subclavian artery plaque without stenosis. Normal right vertebral artery origin. The right vertebral artery is patent and normal to the skull base. Mild proximal left subclavian artery plaque without stenosis. Normal left vertebral artery origin. Tortuous left V1 segment with a mildly kinked appearance. Mildly non dominant left vertebral artery is otherwise normal to the skull base. CTA HEAD Posterior circulation: Fairly codominant distal vertebral arteries are normal to the vertebrobasilar junction. Both PICA origins are patent. Patent basilar artery without stenosis. Patent SCA origins. Normal right PCA origin with a fetal  left PCA origin. Small right posterior communicating artery is present. Bilateral PCA branches are within normal limits. Anterior circulation: Both ICA siphons are patent and appear normal aside from mild calcified atherosclerosis, greater on the left. Normal ophthalmic artery origins. Normal left posterior communicating artery origin. Normal carotid termini, MCA and ACA origins. Mildly dominant left ACA. Anterior communicating artery and bilateral ACA branches are within normal limits. Left MCA M1 segment, bifurcation, and left MCA branches are within normal limits. Right MCA M1 segment, trifurcation, and right MCA branches are within normal limits. Venous sinuses: Patent. Anatomic variants: Fetal left PCA origin. Delayed phase: Subtle increased hypodensity at the posterior limb of the left internal capsule since the CT yesterday. Otherwise Stable gray-white matter differentiation throughout the brain; multifocal hypodensity in the white matter, deep gray matter, and chronic right inferior cerebellar infarct. No abnormal enhancement identified. Review of the MIP images confirms the above findings IMPRESSION: 1. Aortic arch, right cervical carotid, and bilateral ICA siphon atherosclerosis seems advanced for age. But there is no arterial stenosis, and no large vessel or circle-of-Willis branch occlusion. 2. Positive for abnormal appearance of both cervical carotid arteries favored to reflect a spectrum of fibromuscular dysplasia (FMD): - irregular beaded appearance plus possible faint carotid web in the right ICA just below the skull base (series 8, image 110). - ectatic/fusiform aneurysmal left ICA bulb and left ECA trunk (up to 11 mm diameter). 3. Negative anterior circulation branches. Negative vertebral arteries and posterior circulation. 4. No new intracranial abnormality. Electronically Signed   By: Odessa Fleming M.D.   On: 06/23/2018 11:43   Mr Brain Wo Contrast (neuro Protocol)  Result  Date: 06/22/2018 CLINICAL  DATA:  Right arm and leg tingling. EXAM: MRI HEAD WITHOUT CONTRAST TECHNIQUE: Multiplanar, multiecho pulse sequences of the brain and surrounding structures were obtained without intravenous contrast. COMPARISON:  Head CT 06/22/2018 FINDINGS: Brain: There is an acute 6 mm infarct involving the posterior limb of the left internal capsule/lateral margin of the left thalamus. There is a chronic lacunar infarct just superior to this involving the posterior aspect of the left thalamus and adjacent white matter. Chronic lacunar infarcts are also present in the right internal capsule and bilateral cerebellum. Patchy T2 hyperintensities throughout the subcortical and deep cerebral white matter and pons are nonspecific but compatible with moderate chronic small vessel ischemic disease, markedly advanced for age. The ventricles and sulci are normal. No intracranial hemorrhage, mass, midline shift, or extra-axial fluid collection is identified. Vascular: Major intracranial vascular flow voids are preserved. Skull and upper cervical spine: Unremarkable bone marrow signal. Sinuses/Orbits: Unremarkable orbits. Paranasal sinuses and mastoid air cells are clear. Other: None. IMPRESSION: 1. Acute lacunar infarct in the posterior limb of the left internal capsule/lateral thalamus. 2. Markedly age advanced chronic small vessel ischemic disease with multiple chronic lacunar infarcts as above. Electronically Signed   By: Sebastian Ache M.D.   On: 06/22/2018 12:45      Subjective: symptoms stable. Feels better   Discharge Exam: Vitals:   06/24/18 0401 06/24/18 0740  BP: 133/77 140/77  Pulse: 72 63  Resp: 20 16  Temp: 97.9 F (36.6 C) (!) 97.5 F (36.4 C)  SpO2: 100% 96%   Vitals:   06/23/18 2021 06/23/18 2352 06/24/18 0401 06/24/18 0740  BP: (!) 144/81 137/77 133/77 140/77  Pulse: 72 76 72 63  Resp: 18 18 20 16   Temp: 98.4 F (36.9 C) 98.8 F (37.1 C) 97.9 F (36.6 C) (!) 97.5 F (36.4 C)  TempSrc: Oral Oral  Oral Oral  SpO2: 96% 100% 100% 96%  Weight:      Height:        General: Pt is alert, awake, not in acute distress Cardiovascular: RRR, S1/S2 +, no rubs, no gallops Respiratory: CTA bilaterally, no wheezing, no rhonchi Abdominal: Soft, NT, ND, bowel sounds + Extremities: no edema, no cyanosis    The results of significant diagnostics from this hospitalization (including imaging, microbiology, ancillary and laboratory) are listed below for reference.     Microbiology: No results found for this or any previous visit (from the past 240 hour(s)).   Labs: BNP (last 3 results) No results for input(s): BNP in the last 8760 hours. Basic Metabolic Panel: Recent Labs  Lab 06/22/18 0928  NA 142  K 3.7  CL 108  CO2 27  GLUCOSE 96  BUN 14  CREATININE 0.78  CALCIUM 9.3   Liver Function Tests: No results for input(s): AST, ALT, ALKPHOS, BILITOT, PROT, ALBUMIN in the last 168 hours. No results for input(s): LIPASE, AMYLASE in the last 168 hours. No results for input(s): AMMONIA in the last 168 hours. CBC: Recent Labs  Lab 06/22/18 0928  WBC 4.3  NEUTROABS 2.5  HGB 10.8*  HCT 34.2*  MCV 84.7  PLT 278   Cardiac Enzymes: No results for input(s): CKTOTAL, CKMB, CKMBINDEX, TROPONINI in the last 168 hours. BNP: Invalid input(s): POCBNP CBG: No results for input(s): GLUCAP in the last 168 hours. D-Dimer No results for input(s): DDIMER in the last 72 hours. Hgb A1c Recent Labs    06/23/18 0421  HGBA1C 5.8*   Lipid Profile Recent Labs  06/23/18 0421  CHOL 248*  HDL 48  LDLCALC 178*  TRIG 111  CHOLHDL 5.2   Thyroid function studies No results for input(s): TSH, T4TOTAL, T3FREE, THYROIDAB in the last 72 hours.  Invalid input(s): FREET3 Anemia work up No results for input(s): VITAMINB12, FOLATE, FERRITIN, TIBC, IRON, RETICCTPCT in the last 72 hours. Urinalysis    Component Value Date/Time   COLORURINE YELLOW 06/22/2018 1455   APPEARANCEUR HAZY (A) 06/22/2018  1455   LABSPEC 1.009 06/22/2018 1455   PHURINE 7.0 06/22/2018 1455   GLUCOSEU NEGATIVE 06/22/2018 1455   HGBUR NEGATIVE 06/22/2018 1455   BILIRUBINUR NEGATIVE 06/22/2018 1455   KETONESUR NEGATIVE 06/22/2018 1455   PROTEINUR NEGATIVE 06/22/2018 1455   NITRITE NEGATIVE 06/22/2018 1455   LEUKOCYTESUR LARGE (A) 06/22/2018 1455   Sepsis Labs Invalid input(s): PROCALCITONIN,  WBC,  LACTICIDVEN Microbiology No results found for this or any previous visit (from the past 240 hour(s)).   Time coordinating discharge: 35 minutes.   SIGNED:   Alba Cory, MD  [facility] 06/24/2018, 12:06 PM Pager   If 7PM-7AM, please contact night-coverage [email or web address] Password [email or web address]

## 2018-06-24 NOTE — Progress Notes (Signed)
Patient given discharge summary and teaching. Able to verbalize understanding and teach back. No new concerns. IV and telemetry removed. D/c from unit in wheelchair by staff, transported home per family

## 2018-07-05 ENCOUNTER — Encounter (HOSPITAL_COMMUNITY): Payer: Self-pay

## 2018-07-05 ENCOUNTER — Ambulatory Visit (HOSPITAL_COMMUNITY)
Admission: RE | Admit: 2018-07-05 | Discharge: 2018-07-05 | Disposition: A | Discharge status: Home / Self Care | Payer: BLUE CROSS/BLUE SHIELD | Source: Ambulatory Visit | Attending: Internal Medicine | Admitting: Internal Medicine

## 2018-07-05 VITALS — BP 138/82 | HR 86 | Wt 184.4 lb

## 2018-07-05 DIAGNOSIS — I10 Essential (primary) hypertension: Secondary | ICD-10-CM | POA: Diagnosis not present

## 2018-07-05 DIAGNOSIS — Z7982 Long term (current) use of aspirin: Secondary | ICD-10-CM | POA: Diagnosis not present

## 2018-07-05 DIAGNOSIS — I5022 Chronic systolic (congestive) heart failure: Secondary | ICD-10-CM

## 2018-07-05 DIAGNOSIS — Z79899 Other long term (current) drug therapy: Secondary | ICD-10-CM | POA: Diagnosis not present

## 2018-07-05 DIAGNOSIS — Z87891 Personal history of nicotine dependence: Secondary | ICD-10-CM | POA: Insufficient documentation

## 2018-07-05 DIAGNOSIS — I340 Nonrheumatic mitral (valve) insufficiency: Secondary | ICD-10-CM | POA: Diagnosis not present

## 2018-07-05 DIAGNOSIS — Z8249 Family history of ischemic heart disease and other diseases of the circulatory system: Secondary | ICD-10-CM | POA: Diagnosis not present

## 2018-07-05 DIAGNOSIS — I429 Cardiomyopathy, unspecified: Secondary | ICD-10-CM | POA: Diagnosis not present

## 2018-07-05 DIAGNOSIS — Z8673 Personal history of transient ischemic attack (TIA), and cerebral infarction without residual deficits: Secondary | ICD-10-CM | POA: Diagnosis not present

## 2018-07-05 DIAGNOSIS — I110 Hypertensive heart disease with heart failure: Secondary | ICD-10-CM | POA: Diagnosis present

## 2018-07-05 DIAGNOSIS — E785 Hyperlipidemia, unspecified: Secondary | ICD-10-CM | POA: Insufficient documentation

## 2018-07-05 DIAGNOSIS — F17200 Nicotine dependence, unspecified, uncomplicated: Secondary | ICD-10-CM | POA: Diagnosis not present

## 2018-07-05 MED ORDER — LOSARTAN POTASSIUM 25 MG PO TABS: 12.5000 mg | ORAL_TABLET | Freq: Every day | ORAL | 3 refills | Status: DC

## 2018-07-05 NOTE — Patient Instructions (Signed)
START Losartan 12.5 mg (1/2 tablet) once daily. Can start at bedtime to minimize fatigue commonly experienced first week of initiation of medication.  Have labs drawn next week--Take Rx paper with you.  Follow up 8 weeks with Dr. Shirlee Latch.  Take all medication as prescribed the day of your appointment. Bring all medications with you to your appointment.  Do the following things EVERYDAY: 1) Weigh yourself in the morning before breakfast. Write it down and keep it in a log. 2) Take your medicines as prescribed 3) Eat low salt foods-Limit salt (sodium) to 2000 mg per day.  4) Stay as active as you can everyday 5) Limit all fluids for the day to less than 2 liters

## 2018-07-05 NOTE — Progress Notes (Signed)
Advanced Heart Failure Clinic Note   PCP: Dr. Armen Pickup Referring: Norma Fredrickson HF Cardiology: Dr. Shirlee Latch  47 yo with history of cardiomyopathy of uncertain etiology, HTN, and severe mitral regurgitation returns for followup of CHF and MR.  She has had a history of HTN for about 5 years or so.  She had pre-eclampsia with a pregnancy.  In 2016, she had an echo showing EF 35% with diffuse hypokinesis and severe MR.  Workup for secondary hypertension at that time showed elevated urinary metanephrines.  There was concern for pheochromocytoma, but it appears that this was not followed up at the time.  She had a repeat echo in 2/18 with EF 35-40% and again, severe MR.  Cardiac MRI in 7/18 showed EF 44%, severe MR, and no myocardial late gadolinium enhancement.  She had repeat urinary metanephrines => this actually was normal (8/18), serum metanephrines in 9/18 were also normal.   She had right and left heart cath in 9/18, showing no significant CAD and filling pressures were optimized.  TEE in 9/18 showed improvement in EF to 50% with moderate MR by PISA and vena contracta, moderate-severe visually.   Admitted 06/22/18 with acute ischemia stroke. CT left thalamic CVA infarct secondary to small vessel disease. Placed on high dose statin and asa and plavix. Plan to continue plavix for 3 weeks.  Entresto and hyrdralazine held and stopped on discharge. Discharged home  06/24/2018.   Today she returns for post hospital follow up. No deficits from stroke. Overall feeling fine. Denies SOB/PND/Orthopnea. Appetite ok. No fever or chills. No bleeding problems.  Weight at home has been stable. Taking all medications.    Labs (4/16): Elevated urinary normetanephrine and total metanephrines.  Labs (5/16): HIV negative Labs (4/18): K 3.7, creatinine 0.76, LDL 194, TSH normal Labs (5/36): Normal urinary metanephrines Labs (9/18): serum metanephrines normal, hgb 11.1, K 3.6, creatinine 0.74 Labs (06/22/2018): K 3.7  Creatinine 0.78   PMH: 1. HTN: Evaluation in 2016 was concerning for pheochromocytoma with elevated urinary metanephrines but no followup.  8/18 24 hour urine collection actually showed normal urinary metanephrines.  2. Hyperlipidemia 3. Chronic systolic CHF: Nonischemic cardiomyopathy - Echo (4/16): EF 35%, mild LV dilation, mild LVH, mild AI, PASP 49 mmHg, severe MR.  - Echo (2/18): EF 35-40%, moderate LV dilation, diffuse hypokinesis, moderate AI, severe MR, normal RV size and systolic function, moderate TR, moderate PI.  -Echo 06/23/2018 EF 55-60% Grade II DD Moerate MR  - Cardiac MRI (7/18): EF 44% with mild LV dilation, mild LVH, severe central MR, no late gadolinium enhancement noted.  - LHC/RHC (9/18): No significant CAD; mean RA 6, PA 28/12, mean PCWP 10, 3+ MR.  - TEE (9/18): TEE 50%, mild LV dilation, visually moderate to severe MR but moderate by PISA (ERO 0.34) and vena contracta (0.55 cm), normal RV size and systolic function, peak RV-RA gradient 46 mmHg.  4. Mitral regurgitation: Severe by echo and cardiac MRI, appears to be central.  - TEE 9/18 with probably moderate MR (by PISA and vena contracta). MR central, likely functional.  5. CVA 06/22/18 due small vessel disease.   Social History   Socioeconomic History  . Marital status: Single    Spouse name: Not on file  . Number of children: Not on file  . Years of education: Not on file  . Highest education level: Not on file  Occupational History  . Not on file  Social Needs  . Financial resource strain: Not on file  .  Food insecurity:    Worry: Not on file    Inability: Not on file  . Transportation needs:    Medical: Not on file    Non-medical: Not on file  Tobacco Use  . Smoking status: Former Smoker    Packs/day: 0.50    Years: 20.00    Pack years: 10.00    Last attempt to quit: 03/22/2015    Years since quitting: 3.2  . Smokeless tobacco: Former Engineer, water and Sexual Activity  . Alcohol use: No  . Drug  use: No  . Sexual activity: Not on file  Lifestyle  . Physical activity:    Days per week: Not on file    Minutes per session: Not on file  . Stress: Not on file  Relationships  . Social connections:    Talks on phone: Not on file    Gets together: Not on file    Attends religious service: Not on file    Active member of club or organization: Not on file    Attends meetings of clubs or organizations: Not on file    Relationship status: Not on file  . Intimate partner violence:    Fear of current or ex partner: Not on file    Emotionally abused: Not on file    Physically abused: Not on file    Forced sexual activity: Not on file  Other Topics Concern  . Not on file  Social History Narrative  . Not on file   Family History  Problem Relation Age of Onset  . Heart failure Father   . Hypertension Father   . Congestive Heart Failure Father   . Alzheimer's disease Mother   . Asthma Brother    Review of systems complete and found to be negative unless listed in HPI.    Current Outpatient Medications  Medication Sig Dispense Refill  . aspirin EC 81 MG EC tablet Take 1 tablet (81 mg total) by mouth daily. 30 tablet 0  . atorvastatin (LIPITOR) 80 MG tablet Take 1 tablet (80 mg total) by mouth daily at 6 PM. 30 tablet 0  . carvedilol (COREG) 25 MG tablet Take 1 tablet (25 mg total) by mouth 2 (two) times daily with a meal. 180 tablet 3  . clopidogrel (PLAVIX) 75 MG tablet Take 1 tablet (75 mg total) by mouth daily. 30 tablet 0  . furosemide (LASIX) 40 MG tablet Take 1 tablet (40 mg total) by mouth daily. 90 tablet 1  . nicotine (NICODERM CQ - DOSED IN MG/24 HR) 7 mg/24hr patch Place 1 patch (7 mg total) onto the skin daily. 28 patch 0  . potassium chloride SA (K-DUR,KLOR-CON) 20 MEQ tablet Take 1 tablet (20 mEq total) by mouth 2 (two) times daily. 180 tablet 1   No current facility-administered medications for this encounter.    Vitals:   07/05/18 1006  BP: 138/82  Pulse: 86    SpO2: 99%  Weight: 184 lb 6.4 oz (83.6 kg)   Wt Readings from Last 3 Encounters:  07/05/18 184 lb 6.4 oz (83.6 kg)  06/22/18 189 lb (85.7 kg)  05/19/18 190 lb (86.2 kg)    General:  Well appearing. No resp difficulty HEENT: normal Neck: supple. no JVD. Carotids 2+ bilat; no bruits. No lymphadenopathy or thryomegaly appreciated. Cor: PMI nondisplaced. Regular rate & rhythm. No rubs, gallops. 2/6 HSM at apex. Lungs: clear Abdomen: soft, nontender, nondistended. No hepatosplenomegaly. No bruits or masses. Good bowel sounds. Extremities: no cyanosis, clubbing,  rash, edema Neuro: alert & orientedx3, cranial nerves grossly intact. moves all 4 extremities w/o difficulty. Affect pleasant   Assessment/Plan: 1. HTN:  -. Restart losartan today.  May need to restart hydralazine. Would like to keep SBP < 130 - Repeat workup for pheochromocytoma is negative.  2. Chronic systolic CHF: Nonischemic cardiomyopathy (no significant CAD on 9/18 cath).  EF 35-40% by 2/18 echo and 44% by 7/18 cMRI. No late gadolinium enhancement on MRI. Etiology uncertain.  It is possible that she could have a cardiomyopathy due to long-standing severe MR (was present on initial echo in 4/16), but evaluation has not appeared to show structural problems with the MV so MR may be functional and and related to the cardiomyopathy. Possible hypertensive cardiomyopathy.   ECHO 7/16 EF 55-60%.  NYHA I. Volume status stable. Continue lasix 40 mg daily.  - Continue lasix 40 mg daily.  - Off entresto and hydralazine after stroke. I am going to resume entresto with concern for hypotension. Today I will start back 12.5 mg losartan daily. BMET in 7-10 days.  -Continue Coreg 25 mg BID 3. Mitral regurgitation: Severe MR noted on imaging back to 2016.  TEE 9/18 with probably moderate MR in the setting of better BP control => moderate by PISA and vena contracta.  The MV appears structurally normal so suspect functional MR likely due to  cardiomyopathy.   - Goal and hope is that MR will improved with good BP control.  4. CVA 06/22/2018 du to small vessel disease. On high dose statin, asa 81 mg, and plavix,. Per neurology she will stop plavix after 3 weeks. She has follow up with Neurology next month. 5. SMoker She has not smoked since recent hospitalization. Congratulated.     Follow up in 8 weeks with Dr Shirlee Latch.     Tonye Becket, NP  07/05/2018

## 2018-07-08 ENCOUNTER — Other Ambulatory Visit (HOSPITAL_COMMUNITY): Payer: Self-pay | Admitting: Adult Health

## 2018-07-08 ENCOUNTER — Encounter: Payer: Self-pay | Admitting: Internal Medicine

## 2018-07-08 ENCOUNTER — Ambulatory Visit: Payer: BLUE CROSS/BLUE SHIELD | Attending: Internal Medicine | Admitting: Internal Medicine

## 2018-07-08 VITALS — BP 130/78 | HR 73 | Temp 98.3°F | Resp 16 | Ht 65.0 in | Wt 184.8 lb

## 2018-07-08 DIAGNOSIS — I420 Dilated cardiomyopathy: Secondary | ICD-10-CM | POA: Diagnosis not present

## 2018-07-08 DIAGNOSIS — Z79899 Other long term (current) drug therapy: Secondary | ICD-10-CM | POA: Diagnosis not present

## 2018-07-08 DIAGNOSIS — Z9889 Other specified postprocedural states: Secondary | ICD-10-CM | POA: Insufficient documentation

## 2018-07-08 DIAGNOSIS — Z8673 Personal history of transient ischemic attack (TIA), and cerebral infarction without residual deficits: Secondary | ICD-10-CM | POA: Insufficient documentation

## 2018-07-08 DIAGNOSIS — I5020 Unspecified systolic (congestive) heart failure: Secondary | ICD-10-CM | POA: Diagnosis not present

## 2018-07-08 DIAGNOSIS — K5900 Constipation, unspecified: Secondary | ICD-10-CM | POA: Insufficient documentation

## 2018-07-08 DIAGNOSIS — Z87891 Personal history of nicotine dependence: Secondary | ICD-10-CM | POA: Diagnosis not present

## 2018-07-08 DIAGNOSIS — I10 Essential (primary) hypertension: Secondary | ICD-10-CM

## 2018-07-08 DIAGNOSIS — F17200 Nicotine dependence, unspecified, uncomplicated: Secondary | ICD-10-CM

## 2018-07-08 DIAGNOSIS — I428 Other cardiomyopathies: Secondary | ICD-10-CM

## 2018-07-08 DIAGNOSIS — Z7982 Long term (current) use of aspirin: Secondary | ICD-10-CM | POA: Diagnosis not present

## 2018-07-08 DIAGNOSIS — I6381 Other cerebral infarction due to occlusion or stenosis of small artery: Secondary | ICD-10-CM | POA: Diagnosis not present

## 2018-07-08 DIAGNOSIS — I110 Hypertensive heart disease with heart failure: Secondary | ICD-10-CM | POA: Insufficient documentation

## 2018-07-08 DIAGNOSIS — I429 Cardiomyopathy, unspecified: Secondary | ICD-10-CM | POA: Diagnosis not present

## 2018-07-08 DIAGNOSIS — E785 Hyperlipidemia, unspecified: Secondary | ICD-10-CM | POA: Diagnosis not present

## 2018-07-08 DIAGNOSIS — D649 Anemia, unspecified: Secondary | ICD-10-CM

## 2018-07-08 DIAGNOSIS — Z8249 Family history of ischemic heart disease and other diseases of the circulatory system: Secondary | ICD-10-CM | POA: Insufficient documentation

## 2018-07-08 MED ORDER — POLYETHYLENE GLYCOL 3350 17 G PO PACK: 17.0000 g | PACK | Freq: Every day | ORAL | 3 refills | Status: DC | PRN

## 2018-07-08 NOTE — Progress Notes (Signed)
Pt states she is having some discomfort in her stomach she forgot she can't eat leafy food while on the blood thinner and she drunk a smoothie on Sunday and she has been having gas

## 2018-07-08 NOTE — Progress Notes (Signed)
Patient ID: Catherine Gross, female    DOB: August 11, 1971  MRN: [id number]  CC: re-establish and Hospitalization Follow-up   Subjective: Catherine Gross is a 47 y.o. female who presents for hospital follow-up and to establish with me as PCP. Her concerns today include:  Patient with history of nonischemic cardiomyopathy with recent EF of 55 to 60%, HTN, severe mitral regurg which has improved over the past 3 years, tobacco dependence, chronic anemia   Patient hospitalized 7/16-18/2019 with acute lacunar infarct involving the left internal capsule/lateral thalamus.  Presented with right upper and lower extremity numbness.  The symptoms have since resolved.  CT angios head/neck revealed atherosclerosis of the aortic arch, right cervical carotid and bilateral internal carotid.she also had irregular beaded appearance of the cervical carotid arteries favoring fibromuscular dysplasia.  LDL 178.  Patient working on smoking cessation.  She is using nicotine patches.  Compliant with aspirin and Plavix.  Has appointment with neurology later this month.  Tobacco dependence: Using patch. Still cravings in a.m. Smoked since age 42.  Stopped 3 yrs ago for 1 yr. Restarted after mother died.  Chronic anemia:  Irregular menses where she skips periods for 1 to 2 months intermittently over the past 1 year. Last was beginning of June and last 4-5 days.  Usually heavy for 2nd-3rd day then light.  On the 2 heavy days she has to change feminine products every Q 3-4 hrs. Never been on iron  NICM: Saw cardiology recently in follow-up posthospitalization.  Entresto and hydralazine were held during hospitalization for CVA.  She was restarted on losartan.  She is also on carvedilol.  Complains of having a lot of gas over the past several days.  Drank a green smoothie 4 days ago which she felt caused bloating and abdominal pain later that evening.  She was not able to have a bowel movement or pass gas that night.  However she  subsequently had bowel movement the next day and one this morning.  Normally has bowel movements twice a day for the past 4 days have been unusual for her.  Denies any abdominal pain or nausea at this time.  Patient Active Problem List   Diagnosis Date Noted  . Left sided lacunar stroke (HCC) 07/08/2018  . Hyperlipidemia   . Cerebrovascular accident (CVA) (HCC) 06/22/2018  . Dilated cardiomyopathy (HCC) 04/02/2015  . Systolic heart failure (HCC) 04/02/2015  . Essential hypertension 04/02/2015  . Accelerated hypertension 03/22/2015  . Sinus tachycardia 03/22/2015  . Normocytic anemia      Current Outpatient Medications on File Prior to Visit  Medication Sig Dispense Refill  . aspirin EC 81 MG EC tablet Take 1 tablet (81 mg total) by mouth daily. 30 tablet 0  . atorvastatin (LIPITOR) 80 MG tablet Take 1 tablet (80 mg total) by mouth daily at 6 PM. 30 tablet 0  . carvedilol (COREG) 25 MG tablet Take 1 tablet (25 mg total) by mouth 2 (two) times daily with a meal. 180 tablet 3  . clopidogrel (PLAVIX) 75 MG tablet Take 1 tablet (75 mg total) by mouth daily. 30 tablet 0  . furosemide (LASIX) 40 MG tablet Take 1 tablet (40 mg total) by mouth daily. 90 tablet 1  . losartan (COZAAR) 25 MG tablet Take 0.5 tablets (12.5 mg total) by mouth daily. 45 tablet 3  . nicotine (NICODERM CQ - DOSED IN MG/24 HR) 7 mg/24hr patch Place 1 patch (7 mg total) onto the skin daily. 28 patch 0  .  potassium chloride SA (K-DUR,KLOR-CON) 20 MEQ tablet Take 1 tablet (20 mEq total) by mouth 2 (two) times daily. 180 tablet 1   No current facility-administered medications on file prior to visit.     No Known Allergies  Social History   Socioeconomic History  . Marital status: Single    Spouse name: Not on file  . Number of children: Not on file  . Years of education: Not on file  . Highest education level: Not on file  Occupational History  . Not on file  Social Needs  . Financial resource strain: Not on file    . Food insecurity:    Worry: Not on file    Inability: Not on file  . Transportation needs:    Medical: Not on file    Non-medical: Not on file  Tobacco Use  . Smoking status: Former Smoker    Packs/day: 0.50    Years: 20.00    Pack years: 10.00    Last attempt to quit: 03/22/2015    Years since quitting: 3.2  . Smokeless tobacco: Former Engineer, water and Sexual Activity  . Alcohol use: No  . Drug use: No  . Sexual activity: Not on file  Lifestyle  . Physical activity:    Days per week: Not on file    Minutes per session: Not on file  . Stress: Not on file  Relationships  . Social connections:    Talks on phone: Not on file    Gets together: Not on file    Attends religious service: Not on file    Active member of club or organization: Not on file    Attends meetings of clubs or organizations: Not on file    Relationship status: Not on file  . Intimate partner violence:    Fear of current or ex partner: Not on file    Emotionally abused: Not on file    Physically abused: Not on file    Forced sexual activity: Not on file  Other Topics Concern  . Not on file  Social History Narrative  . Not on file    Family History  Problem Relation Age of Onset  . Heart failure Father   . Hypertension Father   . Congestive Heart Failure Father   . Alzheimer's disease Mother   . Asthma Brother     Past Surgical History:  Procedure Laterality Date  . CESAREAN SECTION    . RIGHT/LEFT HEART CATH AND CORONARY ANGIOGRAPHY N/A 09/02/2017   Procedure: RIGHT/LEFT HEART CATH AND CORONARY ANGIOGRAPHY;  Surgeon: Laurey Morale, MD;  Location: [facility] INVASIVE CV LAB;  Service: Cardiovascular;  Laterality: N/A;  . TEE WITHOUT CARDIOVERSION N/A 09/02/2017   Procedure: TRANSESOPHAGEAL ECHOCARDIOGRAM (TEE);  Surgeon: Laurey Morale, MD;  Location: [facility] ENDOSCOPY;  Service: Cardiovascular;  Laterality: N/A;    ROS: Review of Systems Negative except as above PHYSICAL EXAM: BP (!) 155/75    Pulse 73   Temp 98.3 F (36.8 C) (Oral)   Resp 16   Ht 5\' 5"  (1.651 m)   Wt 184 lb 12.8 oz (83.8 kg)   SpO2 95%   BMI 30.75 kg/m   Repeat BP 130/78 Physical Exam  General appearance - alert, well appearing, and in no distress Mental status - normal mood, behavior, speech, dress, motor activity, and thought processes Neck - supple, no significant adenopathy Chest - clear to auscultation, no wheezes, rales or rhonchi, symmetric air entry Heart - normal rate, regular rhythm, normal  S1, S2, no murmurs, rubs, clicks or gallops Abdomen -nondistended.  Bowel sounds slightly increased.  No organomegaly.  Nontender. Extremities -no lower extremity edema.  Lab Results  Component Value Date   WBC 4.3 06/22/2018   HGB 10.8 (L) 06/22/2018   HCT 34.2 (L) 06/22/2018   MCV 84.7 06/22/2018   PLT 278 06/22/2018     Chemistry      Component Value Date/Time   NA 142 06/22/2018 0928   NA 141 05/19/2018 1024   K 3.7 06/22/2018 0928   CL 108 06/22/2018 0928   CO2 27 06/22/2018 0928   BUN 14 06/22/2018 0928   BUN 13 05/19/2018 1024   CREATININE 0.78 06/22/2018 0928   CREATININE 0.90 01/20/2017 1052      Component Value Date/Time   CALCIUM 9.3 06/22/2018 0928   ALKPHOS 54 05/19/2018 1024   AST 11 05/19/2018 1024   ALT 8 05/19/2018 1024   BILITOT 0.3 05/19/2018 1024     Lab Results  Component Value Date   CHOL 248 (H) 06/23/2018   HDL 48 06/23/2018   LDLCALC 178 (H) 06/23/2018   TRIG 111 06/23/2018   CHOLHDL 5.2 06/23/2018   ASSESSMENT AND PLAN: 1. Left sided lacunar stroke (HCC) -Stressed the importance of secondary prevention including good blood pressure control, tobacco dependence and use of aspirin/Plavix.  Plan is for 3 weeks of Plavix then stop.  She will keep appointment with neurology later this month.  2. Constipation, unspecified constipation type Encourage increased fiber in the diet. - polyethylene glycol (MIRALAX / GLYCOLAX) packet; Take 17 g by mouth daily as  needed.  Dispense: 14 each; Refill: 3  3. Anemia, unspecified type - Iron, TIBC and Ferritin Panel  4. NICM (nonischemic cardiomyopathy) (HCC) Followed by cardiology.  She will continue on Cozaar and carvedilol.    5. Essential hypertension At goal of 130/80 or lower.  DASH diet discussed.  6. Tobacco dependence Advised to quit.  She is actively working on doing so with use of the nicotine patches.   Patient was given the opportunity to ask questions.  Patient verbalized understanding of the plan and was able to repeat key elements of the plan.   Orders Placed This Encounter  Procedures  . Iron, TIBC and Ferritin Panel     Requested Prescriptions   Signed Prescriptions Disp Refills  . polyethylene glycol (MIRALAX / GLYCOLAX) packet 14 each 3    Sig: Take 17 g by mouth daily as needed.    Return in about 3 months (around 10/08/2018).  Jonah Blue, MD, FACP

## 2018-07-09 ENCOUNTER — Other Ambulatory Visit: Payer: Self-pay | Admitting: Internal Medicine

## 2018-07-09 LAB — IRON,TIBC AND FERRITIN PANEL
FERRITIN: 66 ng/mL (ref 15–150)
IRON: 32 ug/dL (ref 27–159)
Iron Saturation: 10 % — ABNORMAL LOW (ref 15–55)
TIBC: 313 ug/dL (ref 250–450)
UIBC: 281 ug/dL (ref 131–425)

## 2018-07-09 LAB — BASIC METABOLIC PANEL
BUN / CREAT RATIO: 10 (ref 9–23)
BUN: 9 mg/dL (ref 6–24)
CO2: 26 mmol/L (ref 20–29)
CREATININE: 0.94 mg/dL (ref 0.57–1.00)
Calcium: 9.6 mg/dL (ref 8.7–10.2)
Chloride: 104 mmol/L (ref 96–106)
GFR calc non Af Amer: 74 mL/min/{1.73_m2} (ref >59)
GFR, EST AFRICAN AMERICAN: 85 mL/min/{1.73_m2} (ref >59)
Glucose: 96 mg/dL (ref 65–99)
Potassium: 4.1 mmol/L (ref 3.5–5.2)
Sodium: 143 mmol/L (ref 134–144)

## 2018-07-09 MED ORDER — FERROUS SULFATE 325 (65 FE) MG PO TABS: 325.0000 mg | ORAL_TABLET | Freq: Every day | ORAL | 1 refills | Status: DC

## 2018-07-16 ENCOUNTER — Other Ambulatory Visit: Payer: Self-pay

## 2018-07-16 ENCOUNTER — Telehealth: Payer: Self-pay

## 2018-07-16 MED ORDER — FERROUS SULFATE 325 (65 FE) MG PO TABS: 325.0000 mg | ORAL_TABLET | Freq: Every day | ORAL | 1 refills | Status: DC

## 2018-07-16 NOTE — Telephone Encounter (Signed)
Contacted pt to go over lab results pt is aware of results and doesn't have any questions or concerns  Will be resending ferrous sulfate to [facility] on [address]

## 2018-07-22 NOTE — Progress Notes (Signed)
[facility] [address] [place]. [place] [address] [phone]       OFFICE FOLLOW UP NOTE  Ms. Courtenay Hirth Date of Birth:  [birth year] Medical Record Number:  [id number]   Reason for Referral:  hospital stroke follow up  CHIEF COMPLAINT:  Chief Complaint  Patient presents with  . Follow-up    CVA follow up room 9 pt is alone seen in hospital by Dr. Roda Shutters    HPI: Catherine Gross is being seen today for initial visit in the office for left thalamic infarct secondary to small vessel disease on 06/22/2018. History obtained from patient and chart review. Reviewed all radiology images and labs personally.  Ms. Doxie Augenstein is a 47 y.o. female with history of HTN and CM  who presented with R arm tingling.  CT head reviewed and showed nonspecific density deep white matter.  MRI head reviewed and showed left ICA/thalamic lacunar infarct along with small vessel disease and multiple old lacunar infarcts.  CTA head and neck showed advanced arthrosclerosis for age along with bilateral cervical ICAs with beaded appearance and fusiform aneurysmal left ICA bulb and left ECA trunk. 2D echo showed EF of 55 to 60%. LDL 178 and recommended change to lipitor 80.  A1c stable at 5.8.  Patient was not on antithrombotic prior to admission and recommended DAPT for 3 weeks and then aspirin alone.  Patient was discharged home in satisfactory condition without further needs from PT/OT.  Patient is being seen today for hospital follow-up and states she has completely improved without residual deficits.  She has since returned to work on all previous activities without complications.  She continues to take both Plavix and aspirin without side effects of bleeding or bruising.  Continues to take Lipitor without side effects of myalgias.  Blood pressure today satisfactory 124/77.  Patient does have complaints of bilateral shoulder and neck pain but she believes this is due to sitting at a computer and bad  posture.  Recommended neck exercises and these were provided in AVS paperwork.  Recommended follow-up with PCP for repeat lipid panel at follow-up appointment to ensure LDL decreasing as this was greatly elevated during hospitalization.  Denies new or worsening stroke/TIA symptoms.   ROS:   14 system review of systems performed and negative with exception of feeling hot and itching  PMH:  Past Medical History:  Diagnosis Date  . Benign essential HTN   . Cardiomyopathy (HCC)   . Mitral regurgitation   . Preeclampsia    first pregnancy, not with susequent pregnancy  . Stroke [facility])     PSH:  Past Surgical History:  Procedure Laterality Date  . CESAREAN SECTION    . RIGHT/LEFT HEART CATH AND CORONARY ANGIOGRAPHY N/A 09/02/2017   Procedure: RIGHT/LEFT HEART CATH AND CORONARY ANGIOGRAPHY;  Surgeon: Laurey Morale, MD;  Location: [facility] INVASIVE CV LAB;  Service: Cardiovascular;  Laterality: N/A;  . TEE WITHOUT CARDIOVERSION N/A 09/02/2017   Procedure: TRANSESOPHAGEAL ECHOCARDIOGRAM (TEE);  Surgeon: Laurey Morale, MD;  Location: [facility] ENDOSCOPY;  Service: Cardiovascular;  Laterality: N/A;    Social History:  Social History   Socioeconomic History  . Marital status: Single    Spouse name: Not on file  . Number of children: Not on file  . Years of education: Not on file  . Highest education level: Not on file  Occupational History  . Not on file  Social Needs  . Financial resource strain: Not on file  . Food insecurity:  Worry: Not on file    Inability: Not on file  . Transportation needs:    Medical: Not on file    Non-medical: Not on file  Tobacco Use  . Smoking status: Former Smoker    Packs/day: 0.50    Years: 20.00    Pack years: 10.00    Last attempt to quit: 03/22/2015    Years since quitting: 3.3  . Smokeless tobacco: Never Used  Substance and Sexual Activity  . Alcohol use: No  . Drug use: No  . Sexual activity: Not on file  Lifestyle  . Physical activity:     Days per week: Not on file    Minutes per session: Not on file  . Stress: Not on file  Relationships  . Social connections:    Talks on phone: Not on file    Gets together: Not on file    Attends religious service: Not on file    Active member of club or organization: Not on file    Attends meetings of clubs or organizations: Not on file    Relationship status: Not on file  . Intimate partner violence:    Fear of current or ex partner: Not on file    Emotionally abused: Not on file    Physically abused: Not on file    Forced sexual activity: Not on file  Other Topics Concern  . Not on file  Social History Narrative  . Not on file    Family History:  Family History  Problem Relation Age of Onset  . Heart failure Father   . Hypertension Father   . Congestive Heart Failure Father   . Alzheimer's disease Mother   . Asthma Brother     Medications:   Current Outpatient Medications on File Prior to Visit  Medication Sig Dispense Refill  . aspirin EC 81 MG EC tablet Take 1 tablet (81 mg total) by mouth daily. 30 tablet 0  . carvedilol (COREG) 25 MG tablet Take 1 tablet (25 mg total) by mouth 2 (two) times daily with a meal. 180 tablet 3  . ferrous sulfate (FERROUSUL) 325 (65 FE) MG tablet Take 1 tablet (325 mg total) by mouth daily with breakfast. 100 tablet 1  . furosemide (LASIX) 40 MG tablet Take 1 tablet (40 mg total) by mouth daily. 90 tablet 1  . losartan (COZAAR) 25 MG tablet Take 0.5 tablets (12.5 mg total) by mouth daily. 45 tablet 3  . nicotine (NICODERM CQ - DOSED IN MG/24 HR) 7 mg/24hr patch Place 1 patch (7 mg total) onto the skin daily. 28 patch 0  . potassium chloride SA (K-DUR,KLOR-CON) 20 MEQ tablet Take 1 tablet (20 mEq total) by mouth 2 (two) times daily. 180 tablet 1   No current facility-administered medications on file prior to visit.     Allergies:  No Known Allergies   Physical Exam  Vitals:   07/23/18 0806  BP: 124/77  Pulse: 74  Weight: 184 lb  (83.5 kg)  Height: 5\' 5"  (1.651 m)   Body mass index is 30.62 kg/m. No exam data present  General: well developed, well nourished, pleasant middle-aged African-American female, seated, in no evident distress Head: head normocephalic and atraumatic.   Neck: supple with no carotid or supraclavicular bruits Cardiovascular: regular rate and rhythm, no murmurs Musculoskeletal: no deformity Skin:  no rash/petichiae Vascular:  Normal pulses all extremities  Neurologic Exam Mental Status: Awake and fully alert. Oriented to place and time. Recent and remote memory  intact. Attention span, concentration and fund of knowledge appropriate. Mood and affect appropriate.  Cranial Nerves: Fundoscopic exam reveals sharp disc margins. Pupils equal, briskly reactive to light. Extraocular movements full without nystagmus. Visual fields full to confrontation. Hearing intact. Facial sensation intact. Face, tongue, palate moves normally and symmetrically.  Motor: Normal bulk and tone. Normal strength in all tested extremity muscles. Sensory.: intact to touch , pinprick , position and vibratory sensation.  Coordination: Rapid alternating movements normal in all extremities. Finger-to-nose and heel-to-shin performed accurately bilaterally. Gait and Station: Arises from chair without difficulty. Stance is normal. Gait demonstrates normal stride length and balance . Able to heel, toe and tandem walk without difficulty.  Reflexes: 1+ and symmetric. Toes downgoing.    NIHSS  0 Modified Rankin  0  Diagnostic Data (Labs, Imaging, Testing)  CT head without contrast 06/22/2018 IMPRESSION: Nonspecific low-density throughout the deep white matter. This could reflect chronic microvascular ischemic changes or other demyelinating process such as vasculitis or multiple sclerosis. This could be further evaluated with MRI if felt clinically indicated.  MRI brain without contrast 06/22/2018 IMPRESSION: 1. Acute lacunar  infarct in the posterior limb of the left internal capsule/lateral thalamus. 2. Markedly age advanced chronic small vessel ischemic disease with multiple chronic lacunar infarcts as above.  CTA head/neck with and without contrast 06/23/2018 IMPRESSION: 1. Aortic arch, right cervical carotid, and bilateral ICA siphon atherosclerosis seems advanced for age. But there is no arterial stenosis, and no large vessel or circle-of-Willis branch occlusion. 2. Positive for abnormal appearance of both cervical carotid arteries favored to reflect a spectrum of fibromuscular dysplasia (FMD): - irregular beaded appearance plus possible faint carotid web in the right ICA just below the skull base (series 8, image 110). - ectatic/fusiform aneurysmal left ICA bulb and left ECA trunk (up to 11 mm diameter). 3. Negative anterior circulation branches. Negative vertebral arteries and posterior circulation. 4. No new intracranial abnormality.   ASSESSMENT: Catherine Gross is a 47 y.o. year old female here with left ICA/lateral thalamic infarct on 06/22/2018 secondary to small vessel disease. Vascular risk factors include HTN and HLD.  Patient returns today for hospital stroke follow-up and overall is doing well without residual deficits.    PLAN: -Continue aspirin 81 mg daily  and Lipitor 80 mg for secondary stroke prevention  -refill placed for Lipitor 80 mg advised to have PCP refill in the future -Advised to stop Plavix at this time as 3-week DAPT completed -Due to shoulder and neck pain, recommended neck exercises which were provided in AVS along with use of CoQ10 if these pains are related to statin use -F/u with PCP regarding your HLD and HTN management -recommend repeat lipid panel at follow-up appointment to ensure LDL decreasing -if remains elevated, consider starting Repatha for better HLD management -continue to monitor BP at home -Advised to continue to stay active and maintain a healthy  diet -Maintain strict control of hypertension with blood pressure goal below 130/90, diabetes with hemoglobin A1c goal below 6.5% and cholesterol with LDL cholesterol (bad cholesterol) goal below 70 mg/dL. I also advised the patient to eat a healthy diet with plenty of whole grains, cereals, fruits and vegetables, exercise regularly and maintain ideal body weight.  Follow up in 3 months or call earlier if needed   Greater than 50% of time during this 25 minute visit was spent on counseling,explanation of diagnosis of left ICA/lateral thalamic infarct, reviewing risk factor management of HLD and HTN, planning of further management, discussion with  patient and family and coordination of care    George Hugh, [facility]  [facility] [address] [place], [place] [address]  Phone [phone] Fax [phone]

## 2018-07-23 ENCOUNTER — Ambulatory Visit: Payer: BLUE CROSS/BLUE SHIELD | Admitting: Adult Health

## 2018-07-23 ENCOUNTER — Encounter: Payer: Self-pay | Admitting: Adult Health

## 2018-07-23 VITALS — BP 124/77 | HR 74 | Ht 65.0 in | Wt 184.0 lb

## 2018-07-23 DIAGNOSIS — I6381 Other cerebral infarction due to occlusion or stenosis of small artery: Secondary | ICD-10-CM | POA: Diagnosis not present

## 2018-07-23 DIAGNOSIS — I10 Essential (primary) hypertension: Secondary | ICD-10-CM

## 2018-07-23 DIAGNOSIS — E785 Hyperlipidemia, unspecified: Secondary | ICD-10-CM | POA: Diagnosis not present

## 2018-07-23 MED ORDER — ATORVASTATIN CALCIUM 80 MG PO TABS: 80.0000 mg | ORAL_TABLET | Freq: Every day | ORAL | 3 refills | Status: DC

## 2018-07-23 NOTE — Progress Notes (Signed)
I agree with the above plan 

## 2018-07-23 NOTE — Patient Instructions (Addendum)
Continue aspirin 81 mg daily  and lipitor  for secondary stroke prevention  Stop plavix at this time  Continue to follow up with PCP regarding cholesterol and blood pressure management   Lipitor 80mg  prescription refilled - please have PCP refill in the future  You can try CoQ10 200mg  daily to help with muscle aches or pains  Continue to stay active and maintain a healthy diet   Continue to monitor blood pressure at home  Maintain strict control of hypertension with blood pressure goal below 130/90, diabetes with hemoglobin A1c goal below 6.5% and cholesterol with LDL cholesterol (bad cholesterol) goal below 70 mg/dL. I also advised the patient to eat a healthy diet with plenty of whole grains, cereals, fruits and vegetables, exercise regularly and maintain ideal body weight.  Followup in the future with me in 3 months or call earlier if needed       Thank you for coming to see [place] at [facility]. I hope we have been able to provide you high quality care today.  You may receive a patient satisfaction survey over the next few weeks. We would appreciate your feedback and comments so that we may continue to improve ourselves and the health of our patients.       Neck Exercises Neck exercises can be important for many reasons:  They can help you to improve and maintain flexibility in your neck. This can be especially important as you age.  They can help to make your neck stronger. This can make movement easier.  They can reduce or prevent neck pain.  They may help your upper back.  Ask your health care provider which neck exercises would be best for you. Exercises Neck Press Repeat this exercise 10 times. Do it first thing in the morning and right before bed or as told by your health care provider. 1. Lie on your back on a firm bed or on the floor with a pillow under your head. 2. Use your neck muscles to push your head down on the pillow and straighten  your spine. 3. Hold the position as well as you can. Keep your head facing up and your chin tucked. 4. Slowly count to 5 while holding this position. 5. Relax for a few seconds. Then repeat.  Isometric Strengthening Do a full set of these exercises 2 times a day or as told by your health care provider. 1. Sit in a supportive chair and place your hand on your forehead. 2. Push forward with your head and neck while pushing back with your hand. Hold for 10 seconds. 3. Relax. Then repeat the exercise 3 times. 4. Next, do thesequence again, this time putting your hand against the back of your head. Use your head and neck to push backward against the hand pressure. 5. Finally, do the same exercise on either side of your head, pushing sideways against the pressure of your hand.  Prone Head Lifts Repeat this exercise 5 times. Do this 2 times a day or as told by your health care provider. 1. Lie face-down, resting on your elbows so that your chest and upper back are raised. 2. Start with your head facing downward, near your chest. Position your chin either on or near your chest. 3. Slowly lift your head upward. Lift until you are looking straight ahead. Then continue lifting your head as far back as you can stretch. 4. Hold your head up for 5 seconds. Then slowly lower it to your starting  position.  Supine Head Lifts Repeat this exercise 8-10 times. Do this 2 times a day or as told by your health care provider. 1. Lie on your back, bending your knees to point to the ceiling and keeping your feet flat on the floor. 2. Lift your head slowly off the floor, raising your chin toward your chest. 3. Hold for 5 seconds. 4. Relax and repeat.  Scapular Retraction Repeat this exercise 5 times. Do this 2 times a day or as told by your health care provider. 1. Stand with your arms at your sides. Look straight ahead. 2. Slowly pull both shoulders backward and downward until you feel a stretch between your  shoulder blades in your upper back. 3. Hold for 10-30 seconds. 4. Relax and repeat.  Contact a health care provider if:  Your neck pain or discomfort gets much worse when you do an exercise.  Your neck pain or discomfort does not improve within 2 hours after you exercise. If you have any of these problems, stop exercising right away. Do not do the exercises again unless your health care provider says that you can. Get help right away if:  You develop sudden, severe neck pain. If this happens, stop exercising right away. Do not do the exercises again unless your health care provider says that you can. Exercises Neck Stretch  Repeat this exercise 3-5 times. 1. Do this exercise while standing or while sitting in a chair. 2. Place your feet flat on the floor, shoulder-width apart. 3. Slowly turn your head to the right. Turn it all the way to the right so you can look over your right shoulder. Do not tilt or tip your head. 4. Hold this position for 10-30 seconds. 5. Slowly turn your head to the left, to look over your left shoulder. 6. Hold this position for 10-30 seconds.  Neck Retraction Repeat this exercise 8-10 times. Do this 3-4 times a day or as told by your health care provider. 1. Do this exercise while standing or while sitting in a sturdy chair. 2. Look straight ahead. Do not bend your neck. 3. Use your fingers to push your chin backward. Do not bend your neck for this movement. Continue to face straight ahead. If you are doing the exercise properly, you will feel a slight sensation in your throat and a stretch at the back of your neck. 4. Hold the stretch for 1-2 seconds. Relax and repeat.  This information is not intended to replace advice given to you by your health care provider. Make sure you discuss any questions you have with your health care provider. Document Released: 11/05/2015 Document Revised: 05/01/2016 Document Reviewed: 06/04/2015 Elsevier Interactive Patient  Education  [employer].

## 2018-08-20 ENCOUNTER — Ambulatory Visit: Payer: BLUE CROSS/BLUE SHIELD | Admitting: Nurse Practitioner

## 2018-08-31 ENCOUNTER — Ambulatory Visit (HOSPITAL_COMMUNITY)
Admission: RE | Admit: 2018-08-31 | Discharge: 2018-08-31 | Disposition: A | Discharge status: Home / Self Care | Payer: BLUE CROSS/BLUE SHIELD | Source: Ambulatory Visit | Attending: Cardiology | Admitting: Cardiology

## 2018-08-31 VITALS — BP 154/80 | HR 66 | Wt 188.8 lb

## 2018-08-31 DIAGNOSIS — Z79899 Other long term (current) drug therapy: Secondary | ICD-10-CM | POA: Diagnosis not present

## 2018-08-31 DIAGNOSIS — Z87891 Personal history of nicotine dependence: Secondary | ICD-10-CM | POA: Diagnosis not present

## 2018-08-31 DIAGNOSIS — I340 Nonrheumatic mitral (valve) insufficiency: Secondary | ICD-10-CM

## 2018-08-31 DIAGNOSIS — I5022 Chronic systolic (congestive) heart failure: Secondary | ICD-10-CM | POA: Insufficient documentation

## 2018-08-31 DIAGNOSIS — E785 Hyperlipidemia, unspecified: Secondary | ICD-10-CM | POA: Insufficient documentation

## 2018-08-31 DIAGNOSIS — I110 Hypertensive heart disease with heart failure: Secondary | ICD-10-CM | POA: Diagnosis not present

## 2018-08-31 DIAGNOSIS — Z8249 Family history of ischemic heart disease and other diseases of the circulatory system: Secondary | ICD-10-CM | POA: Diagnosis not present

## 2018-08-31 DIAGNOSIS — Z8673 Personal history of transient ischemic attack (TIA), and cerebral infarction without residual deficits: Secondary | ICD-10-CM | POA: Insufficient documentation

## 2018-08-31 DIAGNOSIS — Z09 Encounter for follow-up examination after completed treatment for conditions other than malignant neoplasm: Secondary | ICD-10-CM | POA: Insufficient documentation

## 2018-08-31 DIAGNOSIS — Z7982 Long term (current) use of aspirin: Secondary | ICD-10-CM | POA: Diagnosis not present

## 2018-08-31 DIAGNOSIS — Z825 Family history of asthma and other chronic lower respiratory diseases: Secondary | ICD-10-CM | POA: Insufficient documentation

## 2018-08-31 LAB — BASIC METABOLIC PANEL
ANION GAP: 7 (ref 5–15)
BUN: 10 mg/dL (ref 6–20)
CALCIUM: 9.1 mg/dL (ref 8.9–10.3)
CHLORIDE: 108 mmol/L (ref 98–111)
CO2: 27 mmol/L (ref 22–32)
Creatinine, Ser: 0.77 mg/dL (ref 0.44–1.00)
GFR calc non Af Amer: >60 mL/min (ref >60)
Glucose, Bld: 106 mg/dL — ABNORMAL HIGH (ref 70–99)
Potassium: 3.9 mmol/L (ref 3.5–5.1)
Sodium: 142 mmol/L (ref 135–145)

## 2018-08-31 LAB — LIPID PANEL
Cholesterol: 167 mg/dL (ref 0–200)
HDL: 43 mg/dL (ref >40)
LDL Cholesterol: 111 mg/dL — ABNORMAL HIGH (ref 0–99)
Total CHOL/HDL Ratio: 3.9 RATIO
Triglycerides: 67 mg/dL (ref <150)
VLDL: 13 mg/dL (ref 0–40)

## 2018-08-31 MED ORDER — LOSARTAN POTASSIUM 25 MG PO TABS: 25.0000 mg | ORAL_TABLET | Freq: Every day | ORAL | 3 refills | Status: DC

## 2018-08-31 NOTE — Progress Notes (Signed)
Advanced Heart Failure Clinic Note   PCP: Marcine Matar, MD HF Cardiology: Dr. Shirlee Latch  47 y.o. with history of cardiomyopathy of uncertain etiology, HTN, and severe mitral regurgitation returns for followup of CHF and MR.  She has had a history of HTN for about 5 years or so.  She had pre-eclampsia with a pregnancy.  In 2016, she had an echo showing EF 35% with diffuse hypokinesis and severe MR.  Workup for secondary hypertension at that time showed elevated urinary metanephrines.  There was concern for pheochromocytoma, but it appears that this was not followed up at the time.  She had a repeat echo in 2/18 with EF 35-40% and again, severe MR.  Cardiac MRI in 7/18 showed EF 44%, severe MR, and no myocardial late gadolinium enhancement.  She had repeat urinary metanephrines => this actually was normal (8/18), serum metanephrines in 9/18 were also normal.   She had right and left heart cath in 9/18, showing no significant CAD and filling pressures were optimized.  TEE in 9/18 showed improvement in EF to 50% with moderate MR by PISA and vena contracta, moderate-severe visually.  Echo in 7/19 showed EF 55-60%, mild to moderate AI, moderate MR.   In 7/19, she was admitted with acute lacunar infarct.  CTA neck showed pattern in the carotid arteries that was concerning for fibromuscular dysplasia.   She presents today for followup of CHF and HTN.  Weight is up 4 lbs.  No chest pain.  She is short of breath after walking up 2 flights of stairs.  No problems walking on flat ground.  No orthopnea/PND.  No lightheadedness.   ECG (7/19, personally reviewed): NSR normal.   Labs (4/16): Elevated urinary normetanephrine and total metanephrines.  Labs (5/16): HIV negative Labs (4/18): K 3.7, creatinine 0.76, LDL 194, TSH normal Labs (8/26): Normal urinary metanephrines Labs (9/18): serum metanephrines normal, hgb 11.1, K 3.6, creatinine 0.74 Labs (7/19): LDL 178 Labs (8/19): creatinine 0.94  PMH: 1.  HTN: Evaluation in 2016 was concerning for pheochromocytoma with elevated urinary metanephrines but no followup.  8/18 24 hour urine collection actually showed normal urinary metanephrines.  2. Hyperlipidemia 3. Chronic systolic CHF: Nonischemic cardiomyopathy - Echo (4/16): EF 35%, mild LV dilation, mild LVH, mild AI, PASP 49 mmHg, severe MR.  - Echo (2/18): EF 35-40%, moderate LV dilation, diffuse hypokinesis, moderate AI, severe MR, normal RV size and systolic function, moderate TR, moderate PI.  - Cardiac MRI (7/18): EF 44% with mild LV dilation, mild LVH, severe central MR, no late gadolinium enhancement noted.  - LHC/RHC (9/18): No significant CAD; mean RA 6, PA 28/12, mean PCWP 10, 3+ MR.  - TEE (9/18): TEE 50%, mild LV dilation, visually moderate to severe MR but moderate by PISA (ERO 0.34) and vena contracta (0.55 cm), normal RV size and systolic function, peak RV-RA gradient 46 mmHg.  - Echo (7/19): EF 55-60%, mild LV dilation, no AS, mild-moderate AI, moderate MR, normal RV, severe LAE.  4. Mitral regurgitation: Severe by echo and cardiac MRI, appears to be central.  - TEE 9/18 with probably moderate MR (by PISA and vena contracta). MR central, likely functional. - Echo (7/19): Moderate MR.   5. Lacunar CVA: 7/19.  6. Fibromuscular dysplasia: CTA head/neck 7/19 showed pattern in the carotid arteries concerning for FMD.   Social History   Socioeconomic History  . Marital status: Single    Spouse name: Not on file  . Number of children: Not on file  .  Years of education: Not on file  . Highest education level: Not on file  Occupational History  . Not on file  Social Needs  . Financial resource strain: Not on file  . Food insecurity:    Worry: Not on file    Inability: Not on file  . Transportation needs:    Medical: Not on file    Non-medical: Not on file  Tobacco Use  . Smoking status: Former Smoker    Packs/day: 0.50    Years: 20.00    Pack years: 10.00    Last  attempt to quit: 03/22/2015    Years since quitting: 3.4  . Smokeless tobacco: Never Used  Substance and Sexual Activity  . Alcohol use: No  . Drug use: No  . Sexual activity: Not on file  Lifestyle  . Physical activity:    Days per week: Not on file    Minutes per session: Not on file  . Stress: Not on file  Relationships  . Social connections:    Talks on phone: Not on file    Gets together: Not on file    Attends religious service: Not on file    Active member of club or organization: Not on file    Attends meetings of clubs or organizations: Not on file    Relationship status: Not on file  . Intimate partner violence:    Fear of current or ex partner: Not on file    Emotionally abused: Not on file    Physically abused: Not on file    Forced sexual activity: Not on file  Other Topics Concern  . Not on file  Social History Narrative  . Not on file   Family History  Problem Relation Age of Onset  . Heart failure Father   . Hypertension Father   . Congestive Heart Failure Father   . Alzheimer's disease Mother   . Asthma Brother    Review of systems complete and found to be negative unless listed in HPI.    Current Outpatient Medications  Medication Sig Dispense Refill  . aspirin EC 81 MG EC tablet Take 1 tablet (81 mg total) by mouth daily. 30 tablet 0  . atorvastatin (LIPITOR) 80 MG tablet Take 1 tablet (80 mg total) by mouth daily at 6 PM. 90 tablet 3  . carvedilol (COREG) 25 MG tablet Take 1 tablet (25 mg total) by mouth 2 (two) times daily with a meal. 180 tablet 3  . ferrous sulfate (FERROUSUL) 325 (65 FE) MG tablet Take 1 tablet (325 mg total) by mouth daily with breakfast. 100 tablet 1  . furosemide (LASIX) 40 MG tablet Take 1 tablet (40 mg total) by mouth daily. 90 tablet 1  . losartan (COZAAR) 25 MG tablet Take 1 tablet (25 mg total) by mouth daily. 90 tablet 3  . potassium chloride SA (K-DUR,KLOR-CON) 20 MEQ tablet Take 1 tablet (20 mEq total) by mouth 2 (two)  times daily. 180 tablet 1   No current facility-administered medications for this encounter.    Vitals:   08/31/18 0956  BP: (!) 154/80  Pulse: 66  SpO2: 98%  Weight: 85.6 kg (188 lb 12.8 oz)   Wt Readings from Last 3 Encounters:  08/31/18 85.6 kg (188 lb 12.8 oz)  07/23/18 83.5 kg (184 lb)  07/08/18 83.8 kg (184 lb 12.8 oz)    General: NAD Neck: No JVD, no thyromegaly or thyroid nodule.  Lungs: Clear to auscultation bilaterally with normal respiratory effort. CV:  Nondisplaced PMI.  Heart regular S1/S2, +S4, 1/6 systolic SEM RUSB.  No peripheral edema.  No carotid bruit.  Normal pedal pulses.  Abdomen: Soft, nontender, no hepatosplenomegaly, no distention.  Skin: Intact without lesions or rashes.  Neurologic: Alert and oriented x 3.  Psych: Normal affect. Extremities: No clubbing or cyanosis.  HEENT: Normal.   Assessment/Plan: 1. HTN:  Long standing.  I am concerned that her hypertension could be related to fibromuscular dysplasia involving the renal arteries (carotid pattern was consistent with FMD).   - Repeat workup for pheochromocytoma is negative.  - Continue Coreg 25 mg bid .  - Increase losartan to 25 mg bid. BMET 10 days.  2. Chronic systolic CHF: Nonischemic cardiomyopathy (no significant CAD on 9/18 cath).  EF 35-40% by 2/18 echo and 44% by 7/18 cMRI. No late gadolinium enhancement on MRI. Etiology uncertain.  It is possible that she could have a cardiomyopathy due to long-standing severe MR (was present on initial echo in 4/16), but evaluation has not appeared to show structural problems with the MV so MR may be functional and and related to the cardiomyopathy. Possible hypertensive cardiomyopathy.  EF improved to 50% on 9/18 TEE, 55-60% on 7/19 echo.  NYHA class I-II symptoms.  He is not volume overloaded on exam.  - Increase losartan to 25 mg bid. BMET 10 days.   - Continue lasix 40 mg daily.  - Continue Coreg 25 mg bid.  3. Mitral regurgitation: Severe MR noted on  imaging back to 2016.  TEE 9/18 with probably moderate MR in the setting of better BP control => moderate by PISA and vena contracta.  The MV appears structurally normal so suspect functional MR likely due to cardiomyopathy.  Most recent echo in 7/19 with moderate MR.    4. Lacunar infarct in 7/19.   5.Possible fibromuscular dysplasia: Appearance of carotids on CTA was highly suggesitive of FMD.  - CT chest/abdomen/pelvis for screening purposes (aneurysm, dissection)  in the setting of suspected FMD. - Renal artery dopplers as noted above.    Followup with CHF pharmacist in 2 wks for BP check and med titration.  I will see her back in 3 months.    Marca Ancona 08/31/2018

## 2018-08-31 NOTE — Patient Instructions (Addendum)
Labs today (will call for abnormal results, otherwise no news is good news)  INCREASE Losartan to 25 mg 1 Tablet Daily  Renal artery dopplers have been ordered for you.  CTA Chest/pelvic/abdomin has been ordered for you.  Labs in 10 days (bmet)  Follow up in our pharmacy clinic in 3 weeks.   Follow up in 3 months

## 2018-09-08 ENCOUNTER — Ambulatory Visit (HOSPITAL_COMMUNITY)
Admission: RE | Admit: 2018-09-08 | Discharge: 2018-09-08 | Disposition: A | Discharge status: Home / Self Care | Payer: BLUE CROSS/BLUE SHIELD | Source: Ambulatory Visit | Attending: Cardiology | Admitting: Cardiology

## 2018-09-08 DIAGNOSIS — I700 Atherosclerosis of aorta: Secondary | ICD-10-CM | POA: Diagnosis not present

## 2018-09-08 DIAGNOSIS — I714 Abdominal aortic aneurysm, without rupture: Secondary | ICD-10-CM | POA: Diagnosis not present

## 2018-09-08 DIAGNOSIS — N852 Hypertrophy of uterus: Secondary | ICD-10-CM | POA: Diagnosis not present

## 2018-09-08 DIAGNOSIS — I708 Atherosclerosis of other arteries: Secondary | ICD-10-CM | POA: Insufficient documentation

## 2018-09-08 DIAGNOSIS — K8020 Calculus of gallbladder without cholecystitis without obstruction: Secondary | ICD-10-CM | POA: Diagnosis not present

## 2018-09-08 DIAGNOSIS — I5022 Chronic systolic (congestive) heart failure: Secondary | ICD-10-CM | POA: Diagnosis not present

## 2018-09-08 DIAGNOSIS — I63531 Cerebral infarction due to unspecified occlusion or stenosis of right posterior cerebral artery: Secondary | ICD-10-CM | POA: Diagnosis not present

## 2018-09-08 DIAGNOSIS — K5790 Diverticulosis of intestine, part unspecified, without perforation or abscess without bleeding: Secondary | ICD-10-CM | POA: Insufficient documentation

## 2018-09-08 MED ORDER — IOPAMIDOL (ISOVUE-370) INJECTION 76%
INTRAVENOUS | Status: AC
  Filled 2018-09-08: qty 100

## 2018-09-08 MED ORDER — IOPAMIDOL (ISOVUE-370) INJECTION 76%
100.0000 mL | Freq: Once | INTRAVENOUS | Status: AC | PRN
  Administered 2018-09-08: 100 mL via INTRAVENOUS

## 2018-09-10 ENCOUNTER — Ambulatory Visit (HOSPITAL_COMMUNITY)
Admission: RE | Admit: 2018-09-10 | Discharge: 2018-09-10 | Disposition: A | Discharge status: Home / Self Care | Payer: BLUE CROSS/BLUE SHIELD | Source: Ambulatory Visit | Attending: Cardiology | Admitting: Cardiology

## 2018-09-10 DIAGNOSIS — I5022 Chronic systolic (congestive) heart failure: Secondary | ICD-10-CM | POA: Diagnosis not present

## 2018-09-10 LAB — BASIC METABOLIC PANEL
Anion gap: 4 — ABNORMAL LOW (ref 5–15)
BUN: 7 mg/dL (ref 6–20)
CHLORIDE: 111 mmol/L (ref 98–111)
CO2: 25 mmol/L (ref 22–32)
Calcium: 9 mg/dL (ref 8.9–10.3)
Creatinine, Ser: 0.86 mg/dL (ref 0.44–1.00)
GFR calc Af Amer: >60 mL/min (ref >60)
GLUCOSE: 121 mg/dL — AB (ref 70–99)
POTASSIUM: 3.4 mmol/L — AB (ref 3.5–5.1)
Sodium: 140 mmol/L (ref 135–145)

## 2018-09-13 ENCOUNTER — Other Ambulatory Visit (HOSPITAL_COMMUNITY): Payer: Self-pay | Admitting: Surgery

## 2018-09-13 ENCOUNTER — Other Ambulatory Visit (HOSPITAL_COMMUNITY): Payer: Self-pay | Admitting: Cardiology

## 2018-09-13 DIAGNOSIS — I10 Essential (primary) hypertension: Secondary | ICD-10-CM

## 2018-09-13 DIAGNOSIS — G7102 Facioscapulohumeral muscular dystrophy: Secondary | ICD-10-CM

## 2018-09-13 MED ORDER — POTASSIUM CHLORIDE CRYS ER 20 MEQ PO TBCR: EXTENDED_RELEASE_TABLET | ORAL | 1 refills | Status: DC

## 2018-09-17 ENCOUNTER — Ambulatory Visit (HOSPITAL_COMMUNITY)
Admission: RE | Admit: 2018-09-17 | Discharge: 2018-09-17 | Disposition: A | Discharge status: Home / Self Care | Payer: BLUE CROSS/BLUE SHIELD | Source: Ambulatory Visit | Attending: Cardiovascular Disease | Admitting: Cardiovascular Disease

## 2018-09-17 ENCOUNTER — Other Ambulatory Visit (HOSPITAL_COMMUNITY): Payer: Self-pay | Admitting: Cardiology

## 2018-09-17 ENCOUNTER — Telehealth (HOSPITAL_COMMUNITY): Payer: Self-pay

## 2018-09-17 DIAGNOSIS — I773 Arterial fibromuscular dysplasia: Secondary | ICD-10-CM

## 2018-09-17 NOTE — Telephone Encounter (Signed)
Pt called with results No evidence for renal or mesenteric artery stenosis, pt verbalized understanding

## 2018-09-21 ENCOUNTER — Ambulatory Visit (HOSPITAL_COMMUNITY)
Admission: RE | Admit: 2018-09-21 | Discharge: 2018-09-21 | Disposition: A | Discharge status: Home / Self Care | Payer: BLUE CROSS/BLUE SHIELD | Source: Ambulatory Visit | Attending: Cardiology | Admitting: Cardiology

## 2018-09-21 VITALS — BP 164/90 | HR 76 | Wt 183.4 lb

## 2018-09-21 DIAGNOSIS — I429 Cardiomyopathy, unspecified: Secondary | ICD-10-CM | POA: Insufficient documentation

## 2018-09-21 DIAGNOSIS — I340 Nonrheumatic mitral (valve) insufficiency: Secondary | ICD-10-CM | POA: Diagnosis not present

## 2018-09-21 DIAGNOSIS — Z79899 Other long term (current) drug therapy: Secondary | ICD-10-CM | POA: Insufficient documentation

## 2018-09-21 DIAGNOSIS — I6381 Other cerebral infarction due to occlusion or stenosis of small artery: Secondary | ICD-10-CM | POA: Diagnosis not present

## 2018-09-21 DIAGNOSIS — I5022 Chronic systolic (congestive) heart failure: Secondary | ICD-10-CM | POA: Diagnosis not present

## 2018-09-21 DIAGNOSIS — Z5181 Encounter for therapeutic drug level monitoring: Secondary | ICD-10-CM | POA: Insufficient documentation

## 2018-09-21 DIAGNOSIS — I110 Hypertensive heart disease with heart failure: Secondary | ICD-10-CM | POA: Diagnosis not present

## 2018-09-21 DIAGNOSIS — I10 Essential (primary) hypertension: Secondary | ICD-10-CM

## 2018-09-21 LAB — BASIC METABOLIC PANEL
ANION GAP: 8 (ref 5–15)
BUN: 10 mg/dL (ref 6–20)
CHLORIDE: 106 mmol/L (ref 98–111)
CO2: 25 mmol/L (ref 22–32)
Calcium: 9.3 mg/dL (ref 8.9–10.3)
Creatinine, Ser: 0.97 mg/dL (ref 0.44–1.00)
GFR calc non Af Amer: >60 mL/min (ref >60)
Glucose, Bld: 111 mg/dL — ABNORMAL HIGH (ref 70–99)
POTASSIUM: 3.9 mmol/L (ref 3.5–5.1)
SODIUM: 139 mmol/L (ref 135–145)

## 2018-09-21 MED ORDER — LOSARTAN POTASSIUM 50 MG PO TABS: 50.0000 mg | ORAL_TABLET | Freq: Every day | ORAL | 5 refills | Status: DC

## 2018-09-21 NOTE — Patient Instructions (Signed)
It was great to see you today!  Please INCREASE your losartan to 50 mg DAILY. You may take your current losartan 25 mg tablets #2 DAILY until you pick up the higher strength.   Blood work today. We will call you with any changes.   You are scheduled with the pharmacist again on 10/29 at 3:00 PM.   Please keep your appointment with Dr. Shirlee Latch on 12/06/18.

## 2018-09-21 NOTE — Progress Notes (Signed)
HF MD: [facility]  HPI:  47yo with history of cardiomyopathy of uncertain etiology, HTN, and severe mitral regurgitation returns for followup of CHF and MR. She has had a history of HTN for about 5 years or so. She had pre-eclampsia with a pregnancy. In 2016, she had an echo showing EF 35% with diffuse hypokinesis and severe MR. Workup for secondary hypertension at that time showed elevated urinary metanephrines. There was concern for pheochromocytoma, but it appears that this was not followed up at the time. She had a repeat echo in 2/18 with EF 35-40% and again, severe MR. Cardiac MRI in 7/18 showed EF 44%, severe MR, and no myocardial late gadolinium enhancement. She had repeat urinary metanephrines =>this actually was normal (8/18), serum metanephrines in 9/18 were also normal.   She had right and left heart cath in 9/18, showing no significant CAD and filling pressures were optimized. TEE in 9/18 showed improvement in EF to 50% with moderate MR by PISA and vena contracta, moderate-severe visually.   She returns today for pharmacist-led HF medication titration. At last HF clinic visit on 9/24, her losartan was increased to 25 mg daily.BP remains elevated. No significant exertional dyspnea. No chest pain. No orthopnea/PND. No lightheadedness. 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 She does not get any regular exercise but would like to start walking.    Shortness of breath/dyspnea on exertion? No   Orthopnea/PND? No  Edema? No  Lightheadedness/dizziness? No  Daily weights at home? No  Blood pressure/heart rate monitoring at home? No  Following low-sodium/fluid-restricted diet? Yes - tries to stay away from high sodium foods, 1 cup of coffee per day  HF Medications: Carvedilol 25 mg PO BID Furosemide 40 mg PO daily  KCl 40 qam and 20 mEq QPM Losartan 25 mg daily  Has the patient been experiencing any side  effects to the medications prescribed?  no  Does the patient have any problems obtaining medications due to transportation or finances?   No - [employer] commercial - pays $158/3 month supply of Entresto bc they will only allow her to use mail order so cannot use copay card with mail order   Understanding of regimen: good Understanding of indications: good Potential of compliance: good Patient understands to avoid NSAIDs. Patient understands to avoid decongestants.   Pertinent Lab Values:  09/21/18: Serum creatinine 0.97, BUN 10, Potassium 3.9, Sodium 139  Vital Signs:  Weight: 183.4 lb (dry weight: 188 lb)  Blood pressure: 164/90 mmHg   Heart rate: 76 bpm    Assessment: 1. Chronicsystolic CHF (EF 16-10%>>96% on 09/02/17), due to NICM (?severe MR v HTN). NYHA class Isymptoms.  - Volume status stable  - Increase losartan to 50 mg daily  - Continue carvedilol 25 mg BID, furosemide 40 mg daily, KCl 40 meq QAM and 20 meq QPM  - Off Entresto since CVA in July d/t concern for hypotension - Basic disease state pathophysiology, medication indication, mechanism and side effects reviewed at length with patient and he verbalized understanding 2. HTN: Long-standing.   - BP elevated above goal of <130/90 mmHg  - Increase losartan to 50 mg daily  - Repeat workup for pheochromocytoma is negative 3. Mitral regurgitation: Severe MR noted on imaging back to 2016. TEE 9/18 with probably moderate MR in the setting of better BP control =>moderate by PISA and vena contracta. The MV appears structurally normal so suspect functional MR likely due to  cardiomyopathy. Hopefully, MR will improve with good BP control. 4. Lacunar infarct in 7/19  Plan: 1) Medication changes: Based on clinical presentation, vital signs and recent labs will increase losartan to 50 mg daily 2) Labs: BMET in 2 weeks  3) Follow-up: Pharmacy visit on 10/05/18 and Dr. Shirlee Latch on 12/06/18   Tyler Deis. Bonnye Fava, PharmD,  BCPS, CPP Clinical Pharmacist Pager: [phone] Phone: [phone]

## 2018-10-04 NOTE — Progress Notes (Signed)
HF MD: [facility]  HPI: 47yo with history of cardiomyopathy of uncertain etiology, HTN, and severe mitral regurgitation returns for followup of CHF and MR. She has had a history of HTN for about 5 years or so. She had pre-eclampsia with a pregnancy. In 2016, she had an echo showing EF 35% with diffuse hypokinesis and severe MR. Workup for secondary hypertension at that time showed elevated urinary metanephrines. There was concern for pheochromocytoma, but it appears that this was not followed up at the time. She had a repeat echo in 2/18 with EF 35-40% and again, severe MR. Cardiac MRI in 7/18 showed EF 44%, severe MR, and no myocardial late gadolinium enhancement. She had repeat urinary metanephrines =>this actually was normal (8/18), serum metanephrines in 9/18 were also normal.   She had right and left heart cath in 9/18, showing no significant CAD and filling pressures were optimized. TEE in 9/18 showed improvement in EF to 50% with moderate MR by PISA and vena contracta, moderate-severe visually.   Shereturns today for pharmacist-led HF medication titration.At last HF clinic visit on 10/15, her losartan was increased to 50 mg daily.BP remains elevated. No significant exertional dyspnea. No chest pain. No orthopnea/PND. 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 She does not get any regular exercise but would like to start walking.   Shortness of breath/dyspnea on exertion?No  Orthopnea/PND?No  Edema?No  Lightheadedness/dizziness?No  Daily weights at home?No  Blood pressure/heart rate monitoring at home?No  Following low-sodium/fluid-restricteddiet? Yes- tries to stayaway from high sodium foods, 1 cup of coffee per day  HF Medications: Carvedilol 25 mg PO BID Furosemide 40 mg PO daily  KCl 40 qam and 20 mEq QPM Losartan 50 mg daily  Has the patient been experiencing any side  effects to the medications prescribed?no  Does the patient have any problems obtaining medications due to transportation or finances?No- [employer] commercial   Understanding of regimen:good Understanding of indications:good Potential of compliance:good Patient understands to avoid NSAIDs. Patient understands to avoid decongestants.   Pertinent Lab Values:  10/06/18:Serum creatinine0.79,BUN 8, Potassium4.0, Sodium139  Vital Signs:  Weight:189 lb(dry weight: 183 lb)  Blood pressure:156/98 mmHg  Heart rate:62 bpm  ReDS Vest: 37%  Assessment: 1. Chronicsystolic CHF (EF35-40%>>50% on 09/02/17), due toNICM (?severe MR v HTN). NYHA classIsymptoms. - Volume status may be slightly elevated with weight gain and slightly elevated vest reading   - Switch from losartan 50 mg daily to Entresto 49-51 mg PO BID - Continue carvedilol 25 mg BID, furosemide 40 mg daily, KCl 40 meq QAM and 20 meq QPM - Off Entresto since CVA in July d/t concern for hypotension - Basic disease state pathophysiology, medication indication, mechanism and side effects reviewed at length with patient and he verbalized understanding 2. ZOX:[id number].  -BP elevated above goal of <130/90 mmHg - Switch to Entresto as above - Repeat workup for pheochromocytoma is negative 3. Mitral regurgitation:Severe MR noted on imaging back to 2016. TEE 9/18 with probably moderate MR in the setting of better BP control =>moderate by PISA and vena contracta. The MV appears structurally normal so suspect functional MR likely due to cardiomyopathy. Hopefully, MR will improve with good BP control. 4. Lacunar infarct in 7/19  Plan: 1) Medication changes: Based on clinical presentation, vital signs and recent labs willswitch from losartan to Entresto 49-51 mg BID 2) Labs:BMET today and in 2 weeks 3) Follow-up:Pharmacy visit on 10/20/18 and Dr. Shirlee Latch on  12/06/18   Tyler Deis. Bonnye Fava,  PharmD, BCPS, CPP Clinical Pharmacist Pager: [phone] Phone: [phone]

## 2018-10-05 ENCOUNTER — Other Ambulatory Visit (HOSPITAL_COMMUNITY): Payer: BLUE CROSS/BLUE SHIELD

## 2018-10-06 ENCOUNTER — Ambulatory Visit (HOSPITAL_COMMUNITY)
Admission: RE | Admit: 2018-10-06 | Discharge: 2018-10-06 | Disposition: A | Discharge status: Home / Self Care | Payer: BLUE CROSS/BLUE SHIELD | Source: Ambulatory Visit | Attending: Internal Medicine | Admitting: Internal Medicine

## 2018-10-06 VITALS — BP 156/98 | HR 62 | Wt 189.0 lb

## 2018-10-06 DIAGNOSIS — I429 Cardiomyopathy, unspecified: Secondary | ICD-10-CM | POA: Diagnosis not present

## 2018-10-06 DIAGNOSIS — I5022 Chronic systolic (congestive) heart failure: Secondary | ICD-10-CM | POA: Diagnosis not present

## 2018-10-06 DIAGNOSIS — I110 Hypertensive heart disease with heart failure: Secondary | ICD-10-CM | POA: Diagnosis not present

## 2018-10-06 DIAGNOSIS — I340 Nonrheumatic mitral (valve) insufficiency: Secondary | ICD-10-CM | POA: Insufficient documentation

## 2018-10-06 DIAGNOSIS — Z8673 Personal history of transient ischemic attack (TIA), and cerebral infarction without residual deficits: Secondary | ICD-10-CM | POA: Insufficient documentation

## 2018-10-06 LAB — BASIC METABOLIC PANEL
Anion gap: 3 — ABNORMAL LOW (ref 5–15)
BUN: 8 mg/dL (ref 6–20)
CO2: 28 mmol/L (ref 22–32)
CREATININE: 0.79 mg/dL (ref 0.44–1.00)
Calcium: 9.5 mg/dL (ref 8.9–10.3)
Chloride: 108 mmol/L (ref 98–111)
GFR calc Af Amer: >60 mL/min (ref >60)
GFR calc non Af Amer: >60 mL/min (ref >60)
Glucose, Bld: 99 mg/dL (ref 70–99)
Potassium: 4 mmol/L (ref 3.5–5.1)
Sodium: 139 mmol/L (ref 135–145)

## 2018-10-06 MED ORDER — SACUBITRIL-VALSARTAN 49-51 MG PO TABS: 1.0000 | ORAL_TABLET | Freq: Two times a day (BID) | ORAL | 5 refills | Status: DC

## 2018-10-06 NOTE — Patient Instructions (Signed)
It was great to see you today!  Please STOP losartan.   Please START Entresto 49-51 mg (1 tablet) TWICE DAILY.   You are scheduled with the pharmacist again on 10/20/18 at 3:00 PM.   Please keep your appointment with Dr. Shirlee Latch on 12/06/18.

## 2018-10-08 ENCOUNTER — Ambulatory Visit: Payer: BLUE CROSS/BLUE SHIELD | Attending: Internal Medicine | Admitting: Internal Medicine

## 2018-10-08 ENCOUNTER — Encounter: Payer: Self-pay | Admitting: Internal Medicine

## 2018-10-08 VITALS — BP 110/74 | HR 78 | Temp 98.4°F | Resp 16 | Wt 186.6 lb

## 2018-10-08 DIAGNOSIS — I10 Essential (primary) hypertension: Secondary | ICD-10-CM

## 2018-10-08 DIAGNOSIS — Z87891 Personal history of nicotine dependence: Secondary | ICD-10-CM

## 2018-10-08 DIAGNOSIS — Z825 Family history of asthma and other chronic lower respiratory diseases: Secondary | ICD-10-CM | POA: Diagnosis not present

## 2018-10-08 DIAGNOSIS — I428 Other cardiomyopathies: Secondary | ICD-10-CM | POA: Diagnosis not present

## 2018-10-08 DIAGNOSIS — I340 Nonrheumatic mitral (valve) insufficiency: Secondary | ICD-10-CM | POA: Diagnosis not present

## 2018-10-08 DIAGNOSIS — E785 Hyperlipidemia, unspecified: Secondary | ICD-10-CM | POA: Insufficient documentation

## 2018-10-08 DIAGNOSIS — D649 Anemia, unspecified: Secondary | ICD-10-CM

## 2018-10-08 DIAGNOSIS — Z8249 Family history of ischemic heart disease and other diseases of the circulatory system: Secondary | ICD-10-CM | POA: Diagnosis not present

## 2018-10-08 DIAGNOSIS — Z8673 Personal history of transient ischemic attack (TIA), and cerebral infarction without residual deficits: Secondary | ICD-10-CM | POA: Diagnosis not present

## 2018-10-08 DIAGNOSIS — Z6831 Body mass index (BMI) 31.0-31.9, adult: Secondary | ICD-10-CM | POA: Insufficient documentation

## 2018-10-08 DIAGNOSIS — Z7982 Long term (current) use of aspirin: Secondary | ICD-10-CM | POA: Diagnosis not present

## 2018-10-08 DIAGNOSIS — Z79899 Other long term (current) drug therapy: Secondary | ICD-10-CM | POA: Insufficient documentation

## 2018-10-08 DIAGNOSIS — E669 Obesity, unspecified: Secondary | ICD-10-CM | POA: Diagnosis not present

## 2018-10-08 DIAGNOSIS — I5020 Unspecified systolic (congestive) heart failure: Secondary | ICD-10-CM | POA: Insufficient documentation

## 2018-10-08 DIAGNOSIS — Z820 Family history of epilepsy and other diseases of the nervous system: Secondary | ICD-10-CM | POA: Diagnosis not present

## 2018-10-08 DIAGNOSIS — I110 Hypertensive heart disease with heart failure: Secondary | ICD-10-CM | POA: Insufficient documentation

## 2018-10-08 NOTE — Patient Instructions (Signed)
Follow a Healthy Eating Plan - You can do it! Limit sugary drinks.  Avoid sodas, sweet tea, sport or energy drinks, or fruit drinks.  Drink water, lo-fat milk, or diet drinks. Limit snack foods.   Cut back on candy, cake, cookies, chips, ice cream.  These are a special treat, only in small amounts. Eat plenty of vegetables.  Especially dark green, red, and orange vegetables. Aim for at least 3 servings a day. More is better! Include fruit in your daily diet.  Whole fruit is much healthier than fruit juice! Limit "white" bread, "white" pasta, "white" rice.   Choose "100% whole grain" products, Tiggs or wild rice. Avoid fatty meats. Try "Meatless Monday" and choose eggs or beans one day a week.  When eating meat, choose lean meats like chicken, turkey, and fish.  Grill, broil, or bake meats instead of frying, and eat poultry without the skin. Eat less salt.  Avoid frozen pizzas, frozen dinners and salty foods.  Use seasonings other than salt in cooking.  This can help blood pressure and keep you from swelling Beer, wine and liquor have calories.  If you can safely drink alcohol, limit to 1 drink per day for women, 2 drinks for men  

## 2018-10-08 NOTE — Progress Notes (Signed)
Patient ID: Catherine Gross, female    DOB: 08-Jan-1971  MRN: [id number]  CC: Hypertension   Subjective: Catherine Gross is a 47 y.o. female who presents for chronic ds management Her concerns today include:  Patient with history of nonischemic cardiomyopathy with recent EF of 55 to 60%, HTN, severe mitral regurg which has improved over the past 3 years, tobacco dependence, chronic anemia, CVA 2019  Anemia: Iron studies c/w IDA.  Menses infrequent.  She is perimenopausal.  Last 2 were June and October 2019.  Menses lasted 5 days.  Tob dep:  She quit in August.    HTN/NICM:  restarted on Entresto by her cardiologist No CP/SOB/LE edema Limits salt in the foods She admits that she is not very active.  She would like to get her weight down.  HM: She declines flu shot.  Last Pap was in 2016.  That was normal and HPV was negative. Patient Active Problem List   Diagnosis Date Noted  . Left sided lacunar stroke (HCC) 07/08/2018  . Hyperlipidemia   . Cerebrovascular accident (CVA) (HCC) 06/22/2018  . Dilated cardiomyopathy (HCC) 04/02/2015  . Systolic heart failure (HCC) 04/02/2015  . Essential hypertension 04/02/2015  . Normocytic anemia      Current Outpatient Medications on File Prior to Visit  Medication Sig Dispense Refill  . aspirin EC 81 MG EC tablet Take 1 tablet (81 mg total) by mouth daily. 30 tablet 0  . atorvastatin (LIPITOR) 80 MG tablet Take 1 tablet (80 mg total) by mouth daily at 6 PM. 90 tablet 3  . carvedilol (COREG) 25 MG tablet Take 1 tablet (25 mg total) by mouth 2 (two) times daily with a meal. 180 tablet 3  . ferrous sulfate (FERROUSUL) 325 (65 FE) MG tablet Take 1 tablet (325 mg total) by mouth daily with breakfast. 100 tablet 1  . furosemide (LASIX) 40 MG tablet Take 1 tablet (40 mg total) by mouth daily. 90 tablet 1  . potassium chloride SA (K-DUR,KLOR-CON) 20 MEQ tablet Take 40 meq in AM and 20 meq in PM. 180 tablet 1  . sacubitril-valsartan (ENTRESTO) 49-51 MG  Take 1 tablet by mouth 2 (two) times daily. 60 tablet 5   No current facility-administered medications on file prior to visit.     No Known Allergies  Social History   Socioeconomic History  . Marital status: Single    Spouse name: Not on file  . Number of children: Not on file  . Years of education: Not on file  . Highest education level: Not on file  Occupational History  . Not on file  Social Needs  . Financial resource strain: Not on file  . Food insecurity:    Worry: Not on file    Inability: Not on file  . Transportation needs:    Medical: Not on file    Non-medical: Not on file  Tobacco Use  . Smoking status: Former Smoker    Packs/day: 0.50    Years: 20.00    Pack years: 10.00    Last attempt to quit: 03/22/2015    Years since quitting: 3.5  . Smokeless tobacco: Never Used  Substance and Sexual Activity  . Alcohol use: No  . Drug use: No  . Sexual activity: Not on file  Lifestyle  . Physical activity:    Days per week: Not on file    Minutes per session: Not on file  . Stress: Not on file  Relationships  . Social connections:  Talks on phone: Not on file    Gets together: Not on file    Attends religious service: Not on file    Active member of club or organization: Not on file    Attends meetings of clubs or organizations: Not on file    Relationship status: Not on file  . Intimate partner violence:    Fear of current or ex partner: Not on file    Emotionally abused: Not on file    Physically abused: Not on file    Forced sexual activity: Not on file  Other Topics Concern  . Not on file  Social History Narrative  . Not on file    Family History  Problem Relation Age of Onset  . Heart failure Father   . Hypertension Father   . Congestive Heart Failure Father   . Alzheimer's disease Mother   . Asthma Brother     Past Surgical History:  Procedure Laterality Date  . CESAREAN SECTION    . RIGHT/LEFT HEART CATH AND CORONARY ANGIOGRAPHY N/A  09/02/2017   Procedure: RIGHT/LEFT HEART CATH AND CORONARY ANGIOGRAPHY;  Surgeon: Laurey Morale, MD;  Location: [facility] INVASIVE CV LAB;  Service: Cardiovascular;  Laterality: N/A;  . TEE WITHOUT CARDIOVERSION N/A 09/02/2017   Procedure: TRANSESOPHAGEAL ECHOCARDIOGRAM (TEE);  Surgeon: Laurey Morale, MD;  Location: [facility] ENDOSCOPY;  Service: Cardiovascular;  Laterality: N/A;    ROS: Review of Systems Negative except as above. PHYSICAL EXAM: BP 110/74   Pulse 78   Temp 98.4 F (36.9 C) (Oral)   Resp 16   Wt 186 lb 9.6 oz (84.6 kg)   SpO2 100%   BMI 31.05 kg/m   Wt Readings from Last 3 Encounters:  10/08/18 186 lb 9.6 oz (84.6 kg)  10/06/18 189 lb (85.7 kg)  09/21/18 183 lb 6.4 oz (83.2 kg)   Physical Exam  General appearance - alert, well appearing, and in no distress Mental status - normal mood, behavior, speech, dress, motor activity, and thought processes Neck - supple, no significant adenopathy Chest - clear to auscultation, no wheezes, rales or rhonchi, symmetric air entry Heart - normal rate, regular rhythm, normal S1, S2, no murmurs, rubs, clicks or gallops Extremities - peripheral pulses normal, no pedal edema, no clubbing or cyanosis  Results for orders placed or performed during the hospital encounter of 10/06/18  Basic Metabolic Panel (BMET)  Result Value Ref Range   Sodium 139 135 - 145 mmol/L   Potassium 4.0 3.5 - 5.1 mmol/L   Chloride 108 98 - 111 mmol/L   CO2 28 22 - 32 mmol/L   Glucose, Bld 99 70 - 99 mg/dL   BUN 8 6 - 20 mg/dL   Creatinine, Ser 1.91 0.44 - 1.00 mg/dL   Calcium 9.5 8.9 - 47.8 mg/dL   GFR calc non Af Amer >60 >60 mL/min   GFR calc Af Amer >60 >60 mL/min   Anion gap 3 (L) 5 - 15   Lab Results  Component Value Date   WBC 4.3 06/22/2018   HGB 10.8 (L) 06/22/2018   HCT 34.2 (L) 06/22/2018   MCV 84.7 06/22/2018   PLT 278 06/22/2018   Lab Results  Component Value Date   IRON 32 07/08/2018   TIBC 313 07/08/2018   FERRITIN 66 07/08/2018     ASSESSMENT AND PLAN:  1. Anemia, unspecified type Patient declined CBC checked today.  We will plan to do so on next visit.  Continue iron supplement.  2. Former  smoker Commended her on quitting.  Encouraged her to remain tobacco free  3. Essential hypertension Control.  Continue current medications and DASH diet  4. NICM (nonischemic cardiomyopathy) (HCC) Compensated  5. Obesity (BMI 30-39.9) Discussed healthy eating habits.  Encourage some form of aerobic exercise at least 3 days a week for 30 minutes We will plan to do a Pap smear on next visit.  Patient was given the opportunity to ask questions.  Patient verbalized understanding of the plan and was able to repeat key elements of the plan.   No orders of the defined types were placed in this encounter.    Requested Prescriptions    No prescriptions requested or ordered in this encounter    Return in about 3 months (around 01/08/2019) for PAP.  Jonah Blue, MD, FACP

## 2018-10-20 ENCOUNTER — Inpatient Hospital Stay (HOSPITAL_COMMUNITY)
Admission: RE | Admit: 2018-10-20 | Discharge: 2018-10-20 | Disposition: A | Discharge status: Home / Self Care | Payer: BLUE CROSS/BLUE SHIELD | Source: Ambulatory Visit

## 2018-10-22 NOTE — Progress Notes (Signed)
[facility] [address] [place]. [place] [address] [phone]       OFFICE FOLLOW UP NOTE  Ms. Erion Hermans Date of Birth:  [birth year] Medical Record Number:  [id number]   Reason for visit: Stroke follow up  CHIEF COMPLAINT:  Chief Complaint  Patient presents with  . Follow-up    RM 9, alone. Doing well, no new complaints.    HPI: Felix Pratt is being seen today in the office for left thalamic infarct secondary to small vessel disease on 06/22/2018. History obtained from patient and chart review. Reviewed all radiology images and labs personally.  Ms. Ramandeep Arington is a 47 y.o. female with history of HTN and CM  who presented with R arm tingling.  CT head reviewed and showed nonspecific density deep white matter.  MRI head reviewed and showed left ICA/thalamic lacunar infarct along with small vessel disease and multiple old lacunar infarcts.  CTA head and neck showed advanced arthrosclerosis for age along with bilateral cervical ICAs with beaded appearance and fusiform aneurysmal left ICA bulb and left ECA trunk. 2D echo showed EF of 55 to 60%. LDL 178 and recommended change to lipitor 80.  A1c stable at 5.8.  Patient was not on antithrombotic prior to admission and recommended DAPT for 3 weeks and then aspirin alone.  Patient was discharged home in satisfactory condition without further needs from PT/OT.  07/23/2018 visit: Patient is being seen today for hospital follow-up and states she has completely improved without residual deficits.  She has since returned to work on all previous activities without complications.  She continues to take both Plavix and aspirin without side effects of bleeding or bruising.  Continues to take Lipitor without side effects of myalgias.  Blood pressure today satisfactory 124/77.  Patient does have complaints of bilateral shoulder and neck pain but she believes this is due to sitting at a computer and bad posture.  Recommended neck exercises  and these were provided in AVS paperwork.  Recommended follow-up with PCP for repeat lipid panel at follow-up appointment to ensure LDL decreasing as this was greatly elevated during hospitalization.  Denies new or worsening stroke/TIA symptoms.  Interval history 10/22/2018: Patient is being seen today for follow-up visit.  She states overall she is been doing well but residual deficits or recurring of symptoms.  Continues to take aspirin 81 mg without bleeding or bruising.  Continues to take atorvastatin 80 mg without side effects of myalgias.  Recent lipid panel on 08/31/2018 showed LDL decreased at 111 (prior 178).  Blood pressure today 146/91 which is slightly elevated for patient but she does endorse not taking her morning blood pressure medications.  No further concerns at this time.  Denies new or worsening stroke/TIA symptoms.   ROS:   14 system review of systems performed and negative with exception of no complaints  PMH:  Past Medical History:  Diagnosis Date  . Benign essential HTN   . Cardiomyopathy (HCC)   . Mitral regurgitation   . Preeclampsia    first pregnancy, not with susequent pregnancy  . Stroke [facility])     PSH:  Past Surgical History:  Procedure Laterality Date  . CESAREAN SECTION    . RIGHT/LEFT HEART CATH AND CORONARY ANGIOGRAPHY N/A 09/02/2017   Procedure: RIGHT/LEFT HEART CATH AND CORONARY ANGIOGRAPHY;  Surgeon: Laurey Morale, MD;  Location: [facility] INVASIVE CV LAB;  Service: Cardiovascular;  Laterality: N/A;  . TEE WITHOUT CARDIOVERSION N/A 09/02/2017   Procedure: TRANSESOPHAGEAL ECHOCARDIOGRAM (TEE);  Surgeon: Laurey Morale, MD;  Location: [facility] ENDOSCOPY;  Service: Cardiovascular;  Laterality: N/A;    Social History:  Social History   Socioeconomic History  . Marital status: Single    Spouse name: Not on file  . Number of children: Not on file  . Years of education: Not on file  . Highest education level: Not on file  Occupational History  . Not on file    Social Needs  . Financial resource strain: Not on file  . Food insecurity:    Worry: Not on file    Inability: Not on file  . Transportation needs:    Medical: Not on file    Non-medical: Not on file  Tobacco Use  . Smoking status: Former Smoker    Packs/day: 0.50    Years: 20.00    Pack years: 10.00    Last attempt to quit: 03/22/2015    Years since quitting: 3.5  . Smokeless tobacco: Never Used  Substance and Sexual Activity  . Alcohol use: No  . Drug use: No  . Sexual activity: Not on file  Lifestyle  . Physical activity:    Days per week: Not on file    Minutes per session: Not on file  . Stress: Not on file  Relationships  . Social connections:    Talks on phone: Not on file    Gets together: Not on file    Attends religious service: Not on file    Active member of club or organization: Not on file    Attends meetings of clubs or organizations: Not on file    Relationship status: Not on file  . Intimate partner violence:    Fear of current or ex partner: Not on file    Emotionally abused: Not on file    Physically abused: Not on file    Forced sexual activity: Not on file  Other Topics Concern  . Not on file  Social History Narrative  . Not on file    Family History:  Family History  Problem Relation Age of Onset  . Heart failure Father   . Hypertension Father   . Congestive Heart Failure Father   . Alzheimer's disease Mother   . Asthma Brother     Medications:   Current Outpatient Medications on File Prior to Visit  Medication Sig Dispense Refill  . aspirin EC 81 MG EC tablet Take 1 tablet (81 mg total) by mouth daily. 30 tablet 0  . atorvastatin (LIPITOR) 80 MG tablet Take 1 tablet (80 mg total) by mouth daily at 6 PM. 90 tablet 3  . carvedilol (COREG) 25 MG tablet Take 1 tablet (25 mg total) by mouth 2 (two) times daily with a meal. 180 tablet 3  . ferrous sulfate (FERROUSUL) 325 (65 FE) MG tablet Take 1 tablet (325 mg total) by mouth daily with  breakfast. 100 tablet 1  . furosemide (LASIX) 40 MG tablet Take 1 tablet (40 mg total) by mouth daily. 90 tablet 1  . potassium chloride SA (K-DUR,KLOR-CON) 20 MEQ tablet Take 40 meq in AM and 20 meq in PM. 180 tablet 1  . sacubitril-valsartan (ENTRESTO) 49-51 MG Take 1 tablet by mouth 2 (two) times daily. 60 tablet 5   No current facility-administered medications on file prior to visit.     Allergies:  No Known Allergies   Physical Exam  Vitals:   10/25/18 0733  BP: (!) 146/91  Pulse: 82  Weight: 188 lb (85.3 kg)  Height: 5\' 5"  (1.651 m)   Body mass index is 31.28 kg/m. No exam data present  General: well developed, well nourished, pleasant middle-aged African-American female, seated, in no evident distress Head: head normocephalic and atraumatic.   Neck: supple with no carotid or supraclavicular bruits Cardiovascular: regular rate and rhythm, no murmurs Musculoskeletal: no deformity Skin:  no rash/petichiae Vascular:  Normal pulses all extremities  Neurologic Exam Mental Status: Awake and fully alert. Oriented to place and time. Recent and remote memory intact. Attention span, concentration and fund of knowledge appropriate. Mood and affect appropriate.  Cranial Nerves: Fundoscopic exam deferred. Pupils equal, briskly reactive to light. Extraocular movements full without nystagmus. Visual fields full to confrontation. Hearing intact. Facial sensation intact. Face, tongue, palate moves normally and symmetrically.  Motor: Normal bulk and tone. Normal strength in all tested extremity muscles. Sensory.: intact to touch , pinprick , position and vibratory sensation.  Coordination: Rapid alternating movements normal in all extremities. Finger-to-nose and heel-to-shin performed accurately bilaterally. Gait and Station: Arises from chair without difficulty. Stance is normal. Gait demonstrates normal stride length and balance . Able to heel, toe and tandem walk without difficulty.    Reflexes: 1+ and symmetric. Toes downgoing.     Diagnostic Data (Labs, Imaging, Testing)  CT head without contrast 06/22/2018 IMPRESSION: Nonspecific low-density throughout the deep white matter. This could reflect chronic microvascular ischemic changes or other demyelinating process such as vasculitis or multiple sclerosis. This could be further evaluated with MRI if felt clinically indicated.  MRI brain without contrast 06/22/2018 IMPRESSION: 1. Acute lacunar infarct in the posterior limb of the left internal capsule/lateral thalamus. 2. Markedly age advanced chronic small vessel ischemic disease with multiple chronic lacunar infarcts as above.  CTA head/neck with and without contrast 06/23/2018 IMPRESSION: 1. Aortic arch, right cervical carotid, and bilateral ICA siphon atherosclerosis seems advanced for age. But there is no arterial stenosis, and no large vessel or circle-of-Willis branch occlusion. 2. Positive for abnormal appearance of both cervical carotid arteries favored to reflect a spectrum of fibromuscular dysplasia (FMD): - irregular beaded appearance plus possible faint carotid web in the right ICA just below the skull base (series 8, image 110). - ectatic/fusiform aneurysmal left ICA bulb and left ECA trunk (up to 11 mm diameter). 3. Negative anterior circulation branches. Negative vertebral arteries and posterior circulation. 4. No new intracranial abnormality.   ASSESSMENT: Bellah Schollenberger is a 47 y.o. year old female here with left ICA/lateral thalamic infarct on 06/22/2018 secondary to small vessel disease. Vascular risk factors include HTN and HLD.  Patient is being seen today for follow-up visit and overall doing well from a stroke standpoint without residual deficits or recurring of symptoms.    PLAN: -Continue aspirin 81 mg daily  and Lipitor 80 mg for secondary stroke prevention   -F/u with PCP regarding your HLD and HTN management  -continue to  monitor BP at home -Advised to continue to stay active and maintain a healthy diet -Maintain strict control of hypertension with blood pressure goal below 130/90, diabetes with hemoglobin A1c goal below 6.5% and cholesterol with LDL cholesterol (bad cholesterol) goal below 70 mg/dL. I also advised the patient to eat a healthy diet with plenty of whole grains, cereals, fruits and vegetables, exercise regularly and maintain ideal body weight.  Follow up in 6 months or call earlier if needed   Greater than 50% of time during this 25 minute visit was spent on counseling,explanation of diagnosis of left ICA/lateral thalamic infarct, reviewing  risk factor management of HLD and HTN, planning of further management, discussion with patient and family and coordination of care    George Hugh, [facility]  [facility] [address] [place], [place] [address]  Phone [phone] Fax [phone]

## 2018-10-25 ENCOUNTER — Encounter: Payer: Self-pay | Admitting: Adult Health

## 2018-10-25 ENCOUNTER — Ambulatory Visit (INDEPENDENT_AMBULATORY_CARE_PROVIDER_SITE_OTHER): Payer: BLUE CROSS/BLUE SHIELD | Admitting: Adult Health

## 2018-10-25 VITALS — BP 146/91 | HR 82 | Ht 65.0 in | Wt 188.0 lb

## 2018-10-25 DIAGNOSIS — I10 Essential (primary) hypertension: Secondary | ICD-10-CM

## 2018-10-25 DIAGNOSIS — I6381 Other cerebral infarction due to occlusion or stenosis of small artery: Secondary | ICD-10-CM

## 2018-10-25 DIAGNOSIS — E785 Hyperlipidemia, unspecified: Secondary | ICD-10-CM | POA: Diagnosis not present

## 2018-10-25 NOTE — Progress Notes (Signed)
I agree with the above plan 

## 2018-10-25 NOTE — Patient Instructions (Signed)
Continue aspirin 81 mg daily  and lipitor 80mg   for secondary stroke prevention  Continue to follow up with PCP regarding cholesterol and blood pressure management   Continue to stay active and maintain a healthy diet  Continue to monitor blood pressure at home  Maintain strict control of hypertension with blood pressure goal below 130/90, diabetes with hemoglobin A1c goal below 6.5% and cholesterol with LDL cholesterol (bad cholesterol) goal below 70 mg/dL. I also advised the patient to eat a healthy diet with plenty of whole grains, cereals, fruits and vegetables, exercise regularly and maintain ideal body weight.  Followup in the future with me in 6 months or call earlier if needed       Thank you for coming to see [place] at [facility]. I hope we have been able to provide you high quality care today.  You may receive a patient satisfaction survey over the next few weeks. We would appreciate your feedback and comments so that we may continue to improve ourselves and the health of our patients.

## 2018-10-28 ENCOUNTER — Ambulatory Visit (HOSPITAL_COMMUNITY)
Admission: RE | Admit: 2018-10-28 | Discharge: 2018-10-28 | Disposition: A | Discharge status: Home / Self Care | Payer: BLUE CROSS/BLUE SHIELD | Source: Ambulatory Visit | Attending: Internal Medicine | Admitting: Internal Medicine

## 2018-10-28 VITALS — BP 140/84 | HR 77 | Wt 188.0 lb

## 2018-10-28 DIAGNOSIS — Z79899 Other long term (current) drug therapy: Secondary | ICD-10-CM | POA: Diagnosis not present

## 2018-10-28 DIAGNOSIS — I6381 Other cerebral infarction due to occlusion or stenosis of small artery: Secondary | ICD-10-CM | POA: Diagnosis not present

## 2018-10-28 DIAGNOSIS — I5022 Chronic systolic (congestive) heart failure: Secondary | ICD-10-CM | POA: Diagnosis not present

## 2018-10-28 DIAGNOSIS — I340 Nonrheumatic mitral (valve) insufficiency: Secondary | ICD-10-CM | POA: Diagnosis not present

## 2018-10-28 DIAGNOSIS — I110 Hypertensive heart disease with heart failure: Secondary | ICD-10-CM | POA: Insufficient documentation

## 2018-10-28 DIAGNOSIS — I10 Essential (primary) hypertension: Secondary | ICD-10-CM

## 2018-10-28 LAB — BASIC METABOLIC PANEL
Anion gap: 9 (ref 5–15)
BUN: 9 mg/dL (ref 6–20)
CHLORIDE: 102 mmol/L (ref 98–111)
CO2: 23 mmol/L (ref 22–32)
CREATININE: 0.88 mg/dL (ref 0.44–1.00)
Calcium: 9.2 mg/dL (ref 8.9–10.3)
GFR calc non Af Amer: >60 mL/min (ref >60)
Glucose, Bld: 129 mg/dL — ABNORMAL HIGH (ref 70–99)
POTASSIUM: 3.5 mmol/L (ref 3.5–5.1)
SODIUM: 134 mmol/L — AB (ref 135–145)

## 2018-10-28 MED ORDER — SPIRONOLACTONE 25 MG PO TABS: 25.0000 mg | ORAL_TABLET | Freq: Every day | ORAL | 3 refills | Status: DC

## 2018-10-28 MED ORDER — POTASSIUM CHLORIDE CRYS ER 20 MEQ PO TBCR: 40.0000 meq | EXTENDED_RELEASE_TABLET | Freq: Every day | ORAL | 5 refills | Status: DC

## 2018-10-28 NOTE — Progress Notes (Signed)
HF MD: [facility]  HPI:  47yo with history of cardiomyopathy of uncertain etiology, HTN, and severe mitral regurgitation returns for followup of CHF and MR. She has had a history of HTN for about 5 years or so. She had pre-eclampsia with a pregnancy. In 2016, she had an echo showing EF 35% with diffuse hypokinesis and severe MR. Workup for secondary hypertension at that time showed elevated urinary metanephrines. There was concern for pheochromocytoma, but it appears that this was not followed up at the time. She had a repeat echo in 2/18 with EF 35-40% and again, severe MR. Cardiac MRI in 7/18 showed EF 44%, severe MR, and no myocardial late gadolinium enhancement. She had repeat urinary metanephrines =>this actually was normal (8/18), serum metanephrines in 9/18 were also normal.   She had right and left heart cath in 9/18, showing no significant CAD and filling pressures were optimized. TEE in 9/18 showed improvement in EF to 50% with moderate MR by PISA and vena contracta, moderate-severe visually.   Shereturns today for pharmacist-led HF medication titration. At last pharmacy HF clinic visit on 10/28, her losartan was switched to Entresto 49-51 mg BID after it was d/c'd in July due to concern for hypotension after CVA. BPremains elevated. Denies SOB, CP, orthopnea/PND. No lightheadedness. 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 She does not get any regular exercise but would like to start walking.    . Shortness of breath/dyspnea on exertion? no  . Orthopnea/PND? yes . Edema? no . Lightheadedness/dizziness? no . Daily weights at home? yes . Blood pressure/heart rate monitoring at home? no . Following low-sodium/fluid-restricted diet? yes  HF Medications: Carvedilol 25 mg BID Furosemide 40 mg daily Entresto 49-51 mg BID  Has the patient been experiencing any side effects to the medications prescribed?  no  Does the  patient have any problems obtaining medications due to transportation or finances?   No - [employer]  Understanding of regimen: good Understanding of indications: good Potential of compliance: good Patient understands to avoid NSAIDs. Patient understands to avoid decongestants.    Pertinent Lab Values: . 10/28/18: Serum creatinine 0.88, BUN 9, Potassium 3.5, Sodium 134  10/06/18: Serum creatinine 0.79, BUN 8, Potassium 4.0, Sodium 139  Vital Signs: . Weight: 188 lb (dry weight: 185 lb) . Blood pressure: 140/84 mmHg  . Heart rate: 77 bpm  ReDS Vest: 37%>>40%  Assessment: 1. Chronicsystolic CHF (EF 16-10%>>96-04% on 06/23/18), due to NICM (?severe MR v HTN). NYHA class Isymptoms. - Volume status slightly elevated based on increase in ReDS Vest reading.  - Start Spironolactone 25 mg daily - Decrease Potassium to 40 mEq daily - Continue Carvedilol 25 mg BID, Furosemide 40 mg daily, and Entresto 49-51 mg BID - Basic disease state pathophysiology, medication indication, mechanism and side effects reviewed at length with patient and he verbalized understanding  2. VWU:[id number].  -BPelevated abovegoalof<130/90 mmHg -Start spironolactone and continue Entresto as above - Repeat workup for pheochromocytoma is negative  3. Mitral regurgitation:Severe MR noted on imaging back to 2016. TEE 9/18 with probably moderate MR in the setting of better BP control =>moderate by PISA and vena contracta. The MV appears structurally normal so suspect functional MR likely due to cardiomyopathy. Hopefully, MR will improve with good BP control.  4. Lacunar infarct in 7/19   Plan: 1) Medication changes: Based on clinical presentation, vital signs and recent labs will start Spironolactone 25 mg daily and decrease  Potassium to 40 mEq daily. 2) Labs: BMET today and at next follow-up visit 3) Follow-up: Pharmacy visit on 11/11/18 and with Dr. Shirlee Latch on 12/06/18   Tyler Deis.  Bonnye Fava, PharmD, BCPS, CPP Clinical Pharmacist Phone: [phone] 10/28/2018 2:49 PM

## 2018-10-28 NOTE — Patient Instructions (Addendum)
It was nice to see you today!  Please START spironolactone 1 tablet (25 mg) ONCE daily  Please DECREASE your potassium to 2 tablets (40 mEq) in the morning ONCE daily. Stop taking your evening potassium tablet.  Blood work today - we will call with any changes.  Your next pharmacy visit is on 12/5 at 3 pm and your next visit with Dr. Shirlee Latch is on 12/06/18.

## 2018-10-28 NOTE — Progress Notes (Signed)
error 

## 2018-11-11 ENCOUNTER — Ambulatory Visit (HOSPITAL_COMMUNITY)
Admission: RE | Admit: 2018-11-11 | Discharge: 2018-11-11 | Disposition: A | Discharge status: Home / Self Care | Payer: BLUE CROSS/BLUE SHIELD | Source: Ambulatory Visit | Attending: Internal Medicine | Admitting: Internal Medicine

## 2018-11-11 VITALS — BP 148/88 | HR 68 | Wt 187.0 lb

## 2018-11-11 DIAGNOSIS — I428 Other cardiomyopathies: Secondary | ICD-10-CM | POA: Insufficient documentation

## 2018-11-11 DIAGNOSIS — I5022 Chronic systolic (congestive) heart failure: Secondary | ICD-10-CM | POA: Insufficient documentation

## 2018-11-11 DIAGNOSIS — I340 Nonrheumatic mitral (valve) insufficiency: Secondary | ICD-10-CM | POA: Insufficient documentation

## 2018-11-11 DIAGNOSIS — I10 Essential (primary) hypertension: Secondary | ICD-10-CM

## 2018-11-11 DIAGNOSIS — I110 Hypertensive heart disease with heart failure: Secondary | ICD-10-CM | POA: Diagnosis not present

## 2018-11-11 LAB — BASIC METABOLIC PANEL
Anion gap: 10 (ref 5–15)
BUN: 8 mg/dL (ref 6–20)
CALCIUM: 8.9 mg/dL (ref 8.9–10.3)
CO2: 23 mmol/L (ref 22–32)
CREATININE: 0.75 mg/dL (ref 0.44–1.00)
Chloride: 107 mmol/L (ref 98–111)
GFR calc non Af Amer: >60 mL/min (ref >60)
GLUCOSE: 100 mg/dL — AB (ref 70–99)
POTASSIUM: 3.6 mmol/L (ref 3.5–5.1)
SODIUM: 140 mmol/L (ref 135–145)

## 2018-11-11 MED ORDER — SACUBITRIL-VALSARTAN 97-103 MG PO TABS: 1.0000 | ORAL_TABLET | Freq: Two times a day (BID) | ORAL | 5 refills | Status: DC

## 2018-11-11 NOTE — Progress Notes (Signed)
HF MD: [facility]  HPI:  47yo with history of cardiomyopathy of uncertain etiology, HTN, and severe mitral regurgitation returns for followup of CHF and MR. She has had a history of HTN for about 5 years or so. She had pre-eclampsia with a pregnancy. In 2016, she had an echo showing EF 35% with diffuse hypokinesis and severe MR. Workup for secondary hypertension at that time showed elevated urinary metanephrines. There was concern for pheochromocytoma, but it appears that this was not followed up at the time. She had a repeat echo in 2/18 with EF 35-40% and again, severe MR. Cardiac MRI in 7/18 showed EF 44%, severe MR, and no myocardial late gadolinium enhancement. She had repeat urinary metanephrines =>this actually was normal (8/18), serum metanephrines in 9/18 were also normal.   She had right and left heart cath in 9/18, showing no significant CAD and filling pressures were optimized. TEE in 9/18 showed improvement in EF to 50% with moderate MR by PISA and vena contracta, moderate-severe visually.   Shereturns today for pharmacist-led HF medication titration.At last pharmacy HF clinic visit on 11/21, her KCl was decreased to 40 meq daily and she was started on spironolactone 25 mg daily.BPremains elevated. Denies SOB, CP, orthopnea/PND. No lightheadedness. 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 She does not get any regular exercise but would like to start walking.   Shortness of breath/dyspnea on exertion? no   Orthopnea/PND? no  Edema? no  Lightheadedness/dizziness? no  Daily weights at home? yes - trended up slightly at home after Thanksgiving  Blood pressure/heart rate monitoring at home? no  Following low-sodium/fluid-restricted diet? yes  HF Medications: Carvedilol 25 mg BID Furosemide 40 mg daily Entresto 49-51 mg BID   Has the patient been experiencing any side effects to the medications prescribed?   no  Does the patient have any problems obtaining medications due to transportation or finances?   No - [employer]  Understanding of regimen: good Understanding of indications: good Potential of compliance: good Patient understands to avoid NSAIDs. Patient understands to avoid decongestants.   Pertinent Lab Values:  11/11/18: Serum creatinine 0.75, BUN 8, Potassium 3.6, Sodium 140  Vital Signs:  Weight: 187 lb (dry weight: 185 lb)  Blood pressure: 148/88 mmHg   Heart rate: 68 bpm  ReDS Vest: 37%>>40%>>40%  Assessment: 1. Chronicsystolic CHF (EF35-40%>>55-60% on7/17/19), due toNICM (?severe MR v HTN). NYHA classIsymptoms. - Volume status slightly elevated but stable based on increase in ReDS Vest reading - Increase Entresto to goal 97-103 mg BID - Continue Carvedilol 25 mg BID, Furosemide 40 mg daily, KCl 40 meq daily, and spironolactone 25 mg daily - Basic disease state pathophysiology, medication indication, mechanism and side effects reviewed at length with patient and she verbalized understanding  2. XBD:ZHGD-[id number].  -BPelevated abovegoalof<130/90 mmHg -Increase Entresto as above - Repeat workup for pheochromocytoma is negative  3. Mitral regurgitation:Severe MR noted on imaging back to 2016. TEE 9/18 with probably moderate MR in the setting of better BP control =>moderate by PISA and vena contracta. The MV appears structurally normal so suspect functional MR likely due to cardiomyopathy. Hopefully, MR will improve with good BP control.  4. Lacunar infarct in 7/19  Plan: 1) Medication changes: Based on clinical presentation, vital signs and recent labs will increase Entresto to 97-103 mg BID 2) Labs: BMET today 3) Follow-up: Dr. Shirlee Latch on 12/06/18   Tyler Deis. Bonnye Fava, PharmD, BCPS, CPP Clinical Pharmacist Phone:  [phone] 11/11/2018 3:28 PM

## 2018-11-11 NOTE — Patient Instructions (Signed)
It was great to see you today!  Please INCREASE Entresto to 97-103 mg TWICE DAILY.   Blood work today. We will call you with any changes.   Please keep your appointment with Dr. Shirlee Latch on 12/06/18.

## 2018-12-06 ENCOUNTER — Encounter (HOSPITAL_COMMUNITY): Payer: Self-pay | Admitting: Cardiology

## 2018-12-06 ENCOUNTER — Ambulatory Visit (HOSPITAL_COMMUNITY)
Admission: RE | Admit: 2018-12-06 | Discharge: 2018-12-06 | Disposition: A | Discharge status: Home / Self Care | Payer: BLUE CROSS/BLUE SHIELD | Source: Ambulatory Visit | Attending: Cardiology | Admitting: Cardiology

## 2018-12-06 VITALS — BP 110/78 | HR 75 | Wt 192.2 lb

## 2018-12-06 DIAGNOSIS — Z825 Family history of asthma and other chronic lower respiratory diseases: Secondary | ICD-10-CM | POA: Diagnosis not present

## 2018-12-06 DIAGNOSIS — Z820 Family history of epilepsy and other diseases of the nervous system: Secondary | ICD-10-CM | POA: Diagnosis not present

## 2018-12-06 DIAGNOSIS — Z8249 Family history of ischemic heart disease and other diseases of the circulatory system: Secondary | ICD-10-CM | POA: Insufficient documentation

## 2018-12-06 DIAGNOSIS — Z79899 Other long term (current) drug therapy: Secondary | ICD-10-CM | POA: Diagnosis not present

## 2018-12-06 DIAGNOSIS — Z87891 Personal history of nicotine dependence: Secondary | ICD-10-CM | POA: Diagnosis not present

## 2018-12-06 DIAGNOSIS — I110 Hypertensive heart disease with heart failure: Secondary | ICD-10-CM | POA: Insufficient documentation

## 2018-12-06 DIAGNOSIS — I428 Other cardiomyopathies: Secondary | ICD-10-CM | POA: Diagnosis not present

## 2018-12-06 DIAGNOSIS — Z7982 Long term (current) use of aspirin: Secondary | ICD-10-CM | POA: Insufficient documentation

## 2018-12-06 DIAGNOSIS — Z8673 Personal history of transient ischemic attack (TIA), and cerebral infarction without residual deficits: Secondary | ICD-10-CM | POA: Diagnosis not present

## 2018-12-06 DIAGNOSIS — I10 Essential (primary) hypertension: Secondary | ICD-10-CM

## 2018-12-06 DIAGNOSIS — E785 Hyperlipidemia, unspecified: Secondary | ICD-10-CM | POA: Diagnosis not present

## 2018-12-06 DIAGNOSIS — I340 Nonrheumatic mitral (valve) insufficiency: Secondary | ICD-10-CM | POA: Insufficient documentation

## 2018-12-06 DIAGNOSIS — I5022 Chronic systolic (congestive) heart failure: Secondary | ICD-10-CM | POA: Diagnosis not present

## 2018-12-06 DIAGNOSIS — I509 Heart failure, unspecified: Secondary | ICD-10-CM | POA: Diagnosis not present

## 2018-12-06 NOTE — Patient Instructions (Signed)
Today you have been seen at the Heart failure clinic at [facility]    Scheduled Lab work: in one month  We will call you if your lab work is abnormal.. No news is good news!!   Follow up with NP/PA in 3 months

## 2018-12-07 NOTE — Progress Notes (Signed)
Advanced Heart Failure Clinic Note   PCP: Marcine Matar, MD HF Cardiology: Dr. Shirlee Latch  47 y.o. with history of cardiomyopathy of uncertain etiology, HTN, and severe mitral regurgitation returns for followup of CHF and MR.  She has had a history of HTN for about 5 years or so.  She had pre-eclampsia with a pregnancy.  In 2016, she had an echo showing EF 35% with diffuse hypokinesis and severe MR.  Workup for secondary hypertension at that time showed elevated urinary metanephrines.  There was concern for pheochromocytoma, but it appears that this was not followed up at the time.  She had a repeat echo in 2/18 with EF 35-40% and again, severe MR.  Cardiac MRI in 7/18 showed EF 44%, severe MR, and no myocardial late gadolinium enhancement.  She had repeat urinary metanephrines => this actually was normal (8/18), serum metanephrines in 9/18 were also normal.   She had right and left heart cath in 9/18, showing no significant CAD and filling pressures were optimized.  TEE in 9/18 showed improvement in EF to 50% with moderate MR by PISA and vena contracta, moderate-severe visually.  Echo in 7/19 showed EF 55-60%, mild to moderate AI, moderate MR.   In 7/19, she was admitted with acute lacunar infarct.  CTA neck showed pattern in the carotid arteries that was concerning for fibromuscular dysplasia. Renal artery dopplers in 10/19 did not show evidence for renal artery stenosis.   She presents today for followup of CHF and HTN.  BP is controlled.  She is doing well, no significant exertional dyspnea or chest pain.  No orthostatic symptoms.  No stroke-like symptoms.   Labs (4/16): Elevated urinary normetanephrine and total metanephrines.  Labs (5/16): HIV negative Labs (4/18): K 3.7, creatinine 0.76, LDL 194, TSH normal Labs (1/54): Normal urinary metanephrines Labs (9/18): serum metanephrines normal, hgb 11.1, K 3.6, creatinine 0.74 Labs (7/19): LDL 178 Labs (8/19): creatinine 0.94 Labs (12/19): K  3.6, creatinine 0.75  PMH: 1. HTN: Evaluation in 2016 was concerning for pheochromocytoma with elevated urinary metanephrines but no followup.  8/18 24 hour urine collection actually showed normal urinary metanephrines.  - Renal artery dopplers in 10/19 with no evidence for renal artery stenosis.  2. Hyperlipidemia 3. Chronic systolic CHF: Nonischemic cardiomyopathy - Echo (4/16): EF 35%, mild LV dilation, mild LVH, mild AI, PASP 49 mmHg, severe MR.  - Echo (2/18): EF 35-40%, moderate LV dilation, diffuse hypokinesis, moderate AI, severe MR, normal RV size and systolic function, moderate TR, moderate PI.  - Cardiac MRI (7/18): EF 44% with mild LV dilation, mild LVH, severe central MR, no late gadolinium enhancement noted.  - LHC/RHC (9/18): No significant CAD; mean RA 6, PA 28/12, mean PCWP 10, 3+ MR.  - TEE (9/18): TEE 50%, mild LV dilation, visually moderate to severe MR but moderate by PISA (ERO 0.34) and vena contracta (0.55 cm), normal RV size and systolic function, peak RV-RA gradient 46 mmHg.  - Echo (7/19): EF 55-60%, mild LV dilation, no AS, mild-moderate AI, moderate MR, normal RV, severe LAE.  4. Mitral regurgitation: Severe by echo and cardiac MRI, appears to be central.  - TEE 9/18 with probably moderate MR (by PISA and vena contracta). MR central, likely functional. - Echo (7/19): Moderate MR.   5. Lacunar CVA: 7/19.  6. Fibromuscular dysplasia: CTA head/neck 7/19 showed pattern in the carotid arteries concerning for FMD.  - Renal artery dopplers (10/19): no evidence for renal artery stenosis or mesenteric artery stenosis.  Social History   Socioeconomic History  . Marital status: Single    Spouse name: Not on file  . Number of children: Not on file  . Years of education: Not on file  . Highest education level: Not on file  Occupational History  . Not on file  Social Needs  . Financial resource strain: Not on file  . Food insecurity:    Worry: Not on file     Inability: Not on file  . Transportation needs:    Medical: Not on file    Non-medical: Not on file  Tobacco Use  . Smoking status: Former Smoker    Packs/day: 0.50    Years: 20.00    Pack years: 10.00    Last attempt to quit: 03/22/2015    Years since quitting: 3.7  . Smokeless tobacco: Never Used  Substance and Sexual Activity  . Alcohol use: No  . Drug use: No  . Sexual activity: Not on file  Lifestyle  . Physical activity:    Days per week: Not on file    Minutes per session: Not on file  . Stress: Not on file  Relationships  . Social connections:    Talks on phone: Not on file    Gets together: Not on file    Attends religious service: Not on file    Active member of club or organization: Not on file    Attends meetings of clubs or organizations: Not on file    Relationship status: Not on file  . Intimate partner violence:    Fear of current or ex partner: Not on file    Emotionally abused: Not on file    Physically abused: Not on file    Forced sexual activity: Not on file  Other Topics Concern  . Not on file  Social History Narrative  . Not on file   Family History  Problem Relation Age of Onset  . Heart failure Father   . Hypertension Father   . Congestive Heart Failure Father   . Alzheimer's disease Mother   . Asthma Brother    Review of systems complete and found to be negative unless listed in HPI.    Current Outpatient Medications  Medication Sig Dispense Refill  . aspirin EC 81 MG EC tablet Take 1 tablet (81 mg total) by mouth daily. 30 tablet 0  . atorvastatin (LIPITOR) 80 MG tablet Take 1 tablet (80 mg total) by mouth daily at 6 PM. 90 tablet 3  . carvedilol (COREG) 25 MG tablet Take 1 tablet (25 mg total) by mouth 2 (two) times daily with a meal. 180 tablet 3  . ferrous sulfate (FERROUSUL) 325 (65 FE) MG tablet Take 1 tablet (325 mg total) by mouth daily with breakfast. 100 tablet 1  . furosemide (LASIX) 40 MG tablet Take 1 tablet (40 mg total) by  mouth daily. 90 tablet 1  . potassium chloride SA (K-DUR,KLOR-CON) 20 MEQ tablet Take 2 tablets (40 mEq total) by mouth daily. 60 tablet 5  . sacubitril-valsartan (ENTRESTO) 97-103 MG Take 1 tablet by mouth 2 (two) times daily. 60 tablet 5  . spironolactone (ALDACTONE) 25 MG tablet Take 1 tablet (25 mg total) by mouth daily. 90 tablet 3   No current facility-administered medications for this encounter.    Vitals:   12/06/18 1227  BP: 110/78  Pulse: 75  SpO2: 98%  Weight: 87.2 kg (192 lb 3.2 oz)   Wt Readings from Last 3 Encounters:  12/06/18 87.2  kg (192 lb 3.2 oz)  11/11/18 84.8 kg (187 lb)  10/28/18 85.3 kg (188 lb)    General: NAD Neck: No JVD, no thyromegaly or thyroid nodule.  Lungs: Clear to auscultation bilaterally with normal respiratory effort. CV: Nondisplaced PMI.  Heart regular S1/S2, +S4, no murmur.  No peripheral edema.  No carotid bruit.  Normal pedal pulses.  Abdomen: Soft, nontender, no hepatosplenomegaly, no distention.  Skin: Intact without lesions or rashes.  Neurologic: Alert and oriented x 3.  Psych: Normal affect. Extremities: No clubbing or cyanosis.  HEENT: Normal.   Assessment/Plan: 1. HTN:  Long standing.  She had carotid artery pattern that was consistent with FMD, but renal artery dopplers in 10/19 were not suggestive of renal artery stenosis.  Repeat workup for pheochromocytoma is negative. BP is controlled today.  - Continue Coreg 25 mg bid .  - Continue Entresto 97/103 bid.  - Continue spironolactone 25 daily.   2. Chronic systolic CHF: Nonischemic cardiomyopathy (no significant CAD on 9/18 cath).  EF 35-40% by 2/18 echo and 44% by 7/18 cMRI. No late gadolinium enhancement on MRI. Etiology uncertain.  It is possible that she could have a cardiomyopathy due to long-standing severe MR (was present on initial echo in 4/16), but evaluation has not appeared to show structural problems with the MV so MR may be functional and and related to the  cardiomyopathy. Possible hypertensive cardiomyopathy.  EF improved to 50% on 9/18 TEE, 55-60% on 7/19 echo.  NYHA class I-II symptoms.  She is not volume overloaded on exam.  - Continue current Entresto and spironolactone.   - Continue lasix 40 mg daily. BMET normal recently, repeat in 1 month.  - Continue Coreg 25 mg bid.  3. Mitral regurgitation: Severe MR noted on imaging back to 2016.  TEE 9/18 with probably moderate MR in the setting of better BP control => moderate by PISA and vena contracta.  The MV appears structurally normal so suspect functional MR likely due to cardiomyopathy.  Most recent echo in 7/19 with moderate MR, minimal murmur today.    4. Lacunar infarct in 7/19:  Most likely related to HTN.  5.Possible fibromuscular dysplasia: Appearance of carotids on CTA was highly suggesitive of FMD. CT chest/abdomen/pelvis in 10/19 for screening purposes did not show aneurysm or dissection. Renal artery dopplers were not suggestive of renal artery stenosis or FMD involvement.   Followup with NP/PA in 3 months.   Marca AnconaDalton Shayonna Ocampo 12/07/2018

## 2019-01-06 ENCOUNTER — Ambulatory Visit (HOSPITAL_COMMUNITY)
Admission: RE | Admit: 2019-01-06 | Discharge: 2019-01-06 | Disposition: A | Discharge status: Home / Self Care | Payer: BLUE CROSS/BLUE SHIELD | Source: Ambulatory Visit | Attending: Cardiology | Admitting: Cardiology

## 2019-01-06 DIAGNOSIS — I5022 Chronic systolic (congestive) heart failure: Secondary | ICD-10-CM | POA: Insufficient documentation

## 2019-01-06 LAB — BASIC METABOLIC PANEL
ANION GAP: 8 (ref 5–15)
BUN: 9 mg/dL (ref 6–20)
CHLORIDE: 107 mmol/L (ref 98–111)
CO2: 25 mmol/L (ref 22–32)
Calcium: 8.7 mg/dL — ABNORMAL LOW (ref 8.9–10.3)
Creatinine, Ser: 0.92 mg/dL (ref 0.44–1.00)
GFR calc non Af Amer: >60 mL/min (ref >60)
GLUCOSE: 127 mg/dL — AB (ref 70–99)
Potassium: 3.3 mmol/L — ABNORMAL LOW (ref 3.5–5.1)
Sodium: 140 mmol/L (ref 135–145)

## 2019-01-10 ENCOUNTER — Other Ambulatory Visit: Payer: BLUE CROSS/BLUE SHIELD | Admitting: Internal Medicine

## 2019-01-11 ENCOUNTER — Other Ambulatory Visit (HOSPITAL_COMMUNITY)
Admission: RE | Admit: 2019-01-11 | Discharge: 2019-01-11 | Disposition: A | Discharge status: Home / Self Care | Payer: BLUE CROSS/BLUE SHIELD | Source: Ambulatory Visit | Attending: Internal Medicine | Admitting: Internal Medicine

## 2019-01-11 ENCOUNTER — Encounter: Payer: Self-pay | Admitting: Internal Medicine

## 2019-01-11 ENCOUNTER — Ambulatory Visit (HOSPITAL_BASED_OUTPATIENT_CLINIC_OR_DEPARTMENT_OTHER): Payer: BLUE CROSS/BLUE SHIELD | Admitting: Primary Care

## 2019-01-11 VITALS — BP 138/89 | HR 71 | Temp 98.3°F | Resp 16 | Ht 65.0 in | Wt 184.2 lb

## 2019-01-11 DIAGNOSIS — Z124 Encounter for screening for malignant neoplasm of cervix: Secondary | ICD-10-CM | POA: Insufficient documentation

## 2019-01-11 DIAGNOSIS — Z1239 Encounter for other screening for malignant neoplasm of breast: Secondary | ICD-10-CM

## 2019-01-11 DIAGNOSIS — I5022 Chronic systolic (congestive) heart failure: Secondary | ICD-10-CM

## 2019-01-11 DIAGNOSIS — Z01411 Encounter for gynecological examination (general) (routine) with abnormal findings: Secondary | ICD-10-CM | POA: Diagnosis not present

## 2019-01-11 DIAGNOSIS — N898 Other specified noninflammatory disorders of vagina: Secondary | ICD-10-CM

## 2019-01-11 MED ORDER — FUROSEMIDE 40 MG PO TABS: 40.0000 mg | ORAL_TABLET | Freq: Every day | ORAL | 1 refills | Status: DC

## 2019-01-13 LAB — CYTOLOGY - PAP
CHLAMYDIA, DNA PROBE: NEGATIVE
Candida vaginitis: NEGATIVE
Diagnosis: NEGATIVE
HPV: NOT DETECTED
NEISSERIA GONORRHEA: NEGATIVE
TRICH (WINDOWPATH): POSITIVE — AB

## 2019-01-14 ENCOUNTER — Telehealth: Payer: Self-pay

## 2019-01-14 ENCOUNTER — Other Ambulatory Visit: Payer: Self-pay | Admitting: Internal Medicine

## 2019-01-14 MED ORDER — METRONIDAZOLE 500 MG PO TABS: 500.0000 mg | ORAL_TABLET | Freq: Two times a day (BID) | ORAL | 0 refills | Status: DC

## 2019-01-14 NOTE — Telephone Encounter (Signed)
Contacted pt to go over pap results pt is aware and doesn't have any questions or concerns  Will fax results and form to [facility]. [facility]

## 2019-01-20 ENCOUNTER — Ambulatory Visit (HOSPITAL_COMMUNITY)
Admission: RE | Admit: 2019-01-20 | Discharge: 2019-01-20 | Disposition: A | Discharge status: Home / Self Care | Payer: BLUE CROSS/BLUE SHIELD | Source: Ambulatory Visit | Attending: Cardiology | Admitting: Cardiology

## 2019-01-20 DIAGNOSIS — I420 Dilated cardiomyopathy: Secondary | ICD-10-CM | POA: Insufficient documentation

## 2019-01-20 LAB — BASIC METABOLIC PANEL
ANION GAP: 4 — AB (ref 5–15)
BUN: 9 mg/dL (ref 6–20)
CHLORIDE: 108 mmol/L (ref 98–111)
CO2: 28 mmol/L (ref 22–32)
Calcium: 8.9 mg/dL (ref 8.9–10.3)
Creatinine, Ser: 0.84 mg/dL (ref 0.44–1.00)
GFR calc non Af Amer: >60 mL/min (ref >60)
Glucose, Bld: 103 mg/dL — ABNORMAL HIGH (ref 70–99)
POTASSIUM: 3.7 mmol/L (ref 3.5–5.1)
Sodium: 140 mmol/L (ref 135–145)

## 2019-02-14 ENCOUNTER — Ambulatory Visit
Admission: RE | Admit: 2019-02-14 | Discharge: 2019-02-14 | Disposition: A | Discharge status: Home / Self Care | Payer: BLUE CROSS/BLUE SHIELD | Source: Ambulatory Visit | Attending: Internal Medicine | Admitting: Internal Medicine

## 2019-02-14 DIAGNOSIS — Z1231 Encounter for screening mammogram for malignant neoplasm of breast: Secondary | ICD-10-CM | POA: Diagnosis not present

## 2019-02-14 DIAGNOSIS — Z1239 Encounter for other screening for malignant neoplasm of breast: Secondary | ICD-10-CM

## 2019-02-16 ENCOUNTER — Other Ambulatory Visit: Payer: Self-pay | Admitting: Internal Medicine

## 2019-02-16 DIAGNOSIS — R928 Other abnormal and inconclusive findings on diagnostic imaging of breast: Secondary | ICD-10-CM

## 2019-02-21 ENCOUNTER — Ambulatory Visit
Admission: RE | Admit: 2019-02-21 | Discharge: 2019-02-21 | Disposition: A | Discharge status: Home / Self Care | Payer: BLUE CROSS/BLUE SHIELD | Source: Ambulatory Visit | Attending: Internal Medicine | Admitting: Internal Medicine

## 2019-02-21 ENCOUNTER — Other Ambulatory Visit: Payer: Self-pay

## 2019-02-21 DIAGNOSIS — N6321 Unspecified lump in the left breast, upper outer quadrant: Secondary | ICD-10-CM | POA: Diagnosis not present

## 2019-02-21 DIAGNOSIS — R928 Other abnormal and inconclusive findings on diagnostic imaging of breast: Secondary | ICD-10-CM

## 2019-03-01 ENCOUNTER — Telehealth (HOSPITAL_COMMUNITY): Payer: Self-pay

## 2019-03-01 NOTE — Telephone Encounter (Signed)
PA for [facility] 97/103mg  submitted.  Waiting for decision.

## 2019-03-03 ENCOUNTER — Other Ambulatory Visit (HOSPITAL_COMMUNITY): Payer: Self-pay

## 2019-03-03 MED ORDER — SACUBITRIL-VALSARTAN 97-103 MG PO TABS: 1.0000 | ORAL_TABLET | Freq: Two times a day (BID) | ORAL | 3 refills | Status: DC

## 2019-03-03 NOTE — Telephone Encounter (Signed)
ENTRESO PA APPROVED VIA Trena Platt [id number] VALID 04/03/17-04/02/2020

## 2019-03-07 ENCOUNTER — Encounter (HOSPITAL_COMMUNITY): Payer: BLUE CROSS/BLUE SHIELD

## 2019-03-11 ENCOUNTER — Encounter (HOSPITAL_COMMUNITY): Payer: Self-pay | Admitting: *Deleted

## 2019-04-08 ENCOUNTER — Other Ambulatory Visit: Payer: Self-pay

## 2019-04-08 DIAGNOSIS — I420 Dilated cardiomyopathy: Secondary | ICD-10-CM

## 2019-04-08 MED ORDER — CARVEDILOL 25 MG PO TABS: 25.0000 mg | ORAL_TABLET | Freq: Two times a day (BID) | ORAL | 3 refills | Status: DC

## 2019-04-11 ENCOUNTER — Other Ambulatory Visit: Payer: Self-pay

## 2019-04-11 ENCOUNTER — Encounter: Payer: Self-pay | Admitting: Internal Medicine

## 2019-04-11 ENCOUNTER — Ambulatory Visit: Payer: BLUE CROSS/BLUE SHIELD | Attending: Internal Medicine | Admitting: Internal Medicine

## 2019-04-11 DIAGNOSIS — I428 Other cardiomyopathies: Secondary | ICD-10-CM | POA: Diagnosis not present

## 2019-04-11 DIAGNOSIS — R238 Other skin changes: Secondary | ICD-10-CM | POA: Diagnosis not present

## 2019-04-11 DIAGNOSIS — I10 Essential (primary) hypertension: Secondary | ICD-10-CM | POA: Diagnosis not present

## 2019-04-11 DIAGNOSIS — D509 Iron deficiency anemia, unspecified: Secondary | ICD-10-CM | POA: Diagnosis not present

## 2019-04-11 DIAGNOSIS — I773 Arterial fibromuscular dysplasia: Secondary | ICD-10-CM

## 2019-04-11 DIAGNOSIS — R233 Spontaneous ecchymoses: Secondary | ICD-10-CM

## 2019-04-11 MED ORDER — BLOOD PRESSURE MONITOR DEVI: 0 refills | Status: AC

## 2019-04-11 NOTE — Progress Notes (Signed)
Pt states on the back of her left leg up to her calf and thigh she has a bruise  Pt states on her right leg there is a bruise on just the leg

## 2019-04-11 NOTE — Progress Notes (Signed)
Virtual Visit via Telephone Note Due to current restrictions/limitations of in-office visits due to the COVID-19 pandemic, this scheduled clinical appointment was converted to a telehealth visit  I connected with Catherine Gross on 04/11/19 at 3:53 p.m EDT by telephone  from my office and verified that I am speaking with the correct person using two identifiers.  Patient is at home.  Only the patient and myself participated in this encounter.   I discussed the limitations, risks, security and privacy concerns of performing an evaluation and management service by telephone and the availability of in person appointments. I also discussed with the patient that there may be a patient responsible charge related to this service. The patient expressed understanding and agreed to proceed.   History of Present Illness: Patient with history of nonischemic cardiomyopathy with recent EF of 55 to 60%, HTN, moderate mitral regurg which has improved over the past 3 years, tobacco dependence, chronic IDA, CVA 2019.  Last seen 10/2018.  Pt reports what looks like a bruise on posterior LT leg from mid calf to mid thigh and a little on the calf of RT leg x few wks.  No known trauma to legs.  No pain or swelling in legs. No bleeding gums, epistaxis or rectal bleeding.  No new meds and no herbal or OTC med other than ASA and iron supplement.   NICM/HTN: saw Dr. Jearld Pies in 11/2018 No CP/SOB/LE edema/PND/orthopnea Limits salt in foods.  No device to check blood pressure No getting in much exercise Compliant with medications.  Hx of CVA:  Taking aspirin and Lipitor as prescribed.  CTA of head and neck done July of last year revealed some changes in the carotid arteries favoring fibromuscular dysplasia.  Changes not seen in renal arteries on  CTA of the abdomen done 09/2018  IDA:  Taking iron once a day. Last menses 01/2019; menses becoming more spread out.  Last 5-6 days   Observations/Objective:   Chemistry      Component Value Date/Time   NA 140 01/20/2019 1503   NA 143 07/08/2018 1557   K 3.7 01/20/2019 1503   CL 108 01/20/2019 1503   CO2 28 01/20/2019 1503   BUN 9 01/20/2019 1503   BUN 9 07/08/2018 1557   CREATININE 0.84 01/20/2019 1503   CREATININE 0.90 01/20/2017 1052      Component Value Date/Time   CALCIUM 8.9 01/20/2019 1503   ALKPHOS 54 05/19/2018 1024   AST 11 05/19/2018 1024   ALT 8 05/19/2018 1024   BILITOT 0.3 05/19/2018 1024      Lab Results  Component Value Date   CHOL 167 08/31/2018   HDL 43 08/31/2018   LDLCALC 111 (H) 08/31/2018   TRIG 67 08/31/2018   CHOLHDL 3.9 08/31/2018   Lab Results  Component Value Date   IRON 32 07/08/2018   TIBC 313 07/08/2018   FERRITIN 66 07/08/2018    Assessment and Plan: 1. Easy bruising Will have patient repair in person this week for me to take a look at her legs to evaluate further.  Also told patient that if she has a MyChart account, to take some pictures of the areas and send them to me when she comes in we will check some baseline blood tests - CBC; Future - PT AND PTT; Future  2. Essential hypertension 3. NICM (nonischemic cardiomyopathy) (HCC) Patient to continue current medications including carvedilol, spironolactone, Entresto and furosemide.  Prescription sent to her pharmacy to see if her insurance will  pay for blood pressure monitoring device.  Went over blood pressure goal of 130/80 or lower  4. Iron deficiency anemia, unspecified iron deficiency anemia type On iron.  Now that menses are becoming more spread out we need to recheck her CBC see whether she still needs to be on iron supplement  5. Fibromuscular dysplasia (HCC) Changes seen in carotid arteries on CTA but not in renal arteries   Follow Up Instructions: Short-term follow-up as mentioned above.   I discussed the assessment and treatment plan with the patient. The patient was provided an opportunity to ask questions and all were answered. The  patient agreed with the plan and demonstrated an understanding of the instructions.   The patient was advised to call back or seek an in-person evaluation if the symptoms worsen or if the condition fails to improve as anticipated.  I provided 17 minutes of non-face-to-face time during this encounter.   Jonah Blue, MD

## 2019-04-12 ENCOUNTER — Ambulatory Visit: Payer: BLUE CROSS/BLUE SHIELD | Attending: Internal Medicine | Admitting: Internal Medicine

## 2019-04-12 ENCOUNTER — Other Ambulatory Visit: Payer: Self-pay

## 2019-04-12 ENCOUNTER — Encounter: Payer: Self-pay | Admitting: Internal Medicine

## 2019-04-12 VITALS — BP 111/76 | HR 74 | Temp 98.1°F | Resp 16 | Wt 178.8 lb

## 2019-04-12 DIAGNOSIS — L309 Dermatitis, unspecified: Secondary | ICD-10-CM

## 2019-04-12 DIAGNOSIS — I428 Other cardiomyopathies: Secondary | ICD-10-CM

## 2019-04-12 DIAGNOSIS — R238 Other skin changes: Secondary | ICD-10-CM

## 2019-04-12 DIAGNOSIS — I10 Essential (primary) hypertension: Secondary | ICD-10-CM

## 2019-04-12 DIAGNOSIS — R233 Spontaneous ecchymoses: Secondary | ICD-10-CM

## 2019-04-12 NOTE — Addendum Note (Signed)
Addended by: Jonah Blue B on: 04/12/2019 09:57 AM   Modules accepted: Orders

## 2019-04-12 NOTE — Progress Notes (Signed)
Patient ID: Catherine Gross, female    DOB: [birth year]  MRN: [id number]  CC:  Bruising legs  Subjective:  Catherine Gross is a 48 y.o. female who presents for UC visit. Her concerns today include:   Patient presents today for me to evaluate the bruises that she states she has noticed on her lower legs. Pt reports what looks like a bruise on posterior LT leg from mid calf to mid thigh and a little on the calf of RT leg x few wks.  No known trauma to legs.  No pain or swelling in legs. No bleeding gums, epistaxis or rectal bleeding.  No new meds and no herbal or OTC med other than ASA and iron supplement. -She has not had any swollen or aching in the joints.  No unexplained weight loss.  No fevers.  Patient Active Problem List   Diagnosis Date Noted  . Fibromuscular dysplasia (HCC) 04/11/2019  . Left sided lacunar stroke (HCC) 07/08/2018  . Hyperlipidemia   . Cerebrovascular accident (CVA) (HCC) 06/22/2018  . Dilated cardiomyopathy (HCC) 04/02/2015  . Systolic heart failure (HCC) 04/02/2015  . Essential hypertension 04/02/2015  . Normocytic anemia      Current Outpatient Medications on File Prior to Visit  Medication Sig Dispense Refill  . aspirin EC 81 MG EC tablet Take 1 tablet (81 mg total) by mouth daily. 30 tablet 0  . atorvastatin (LIPITOR) 80 MG tablet Take 1 tablet (80 mg total) by mouth daily at 6 PM. 90 tablet 3  . Blood Pressure Monitor DEVI Use as directed to check home blood pressure 2-3 times a week 1 Device 0  . carvedilol (COREG) 25 MG tablet Take 1 tablet (25 mg total) by mouth 2 (two) times daily with a meal. 180 tablet 3  . ferrous sulfate (FERROUSUL) 325 (65 FE) MG tablet Take 1 tablet (325 mg total) by mouth daily with breakfast. 100 tablet 1  . furosemide (LASIX) 40 MG tablet Take 1 tablet (40 mg total) by mouth daily. 90 tablet 1  . potassium chloride SA (K-DUR,KLOR-CON) 20 MEQ tablet Take 2 tablets (40 mEq total) by mouth daily. 60 tablet 5  .  sacubitril-valsartan (ENTRESTO) 97-103 MG Take 1 tablet by mouth 2 (two) times daily. 180 tablet 3  . spironolactone (ALDACTONE) 25 MG tablet Take 1 tablet (25 mg total) by mouth daily. 90 tablet 3   No current facility-administered medications on file prior to visit.     No Known Allergies   ROS: Review of Systems Negative except as above PHYSICAL EXAM: BP 111/76   Pulse 74   Temp 98.1 F (36.7 C) (Oral)   Resp 16   Wt 178 lb 12.8 oz (81.1 kg)   SpO2 100%   BMI 29.75 kg/m    Physical Exam  General appearance - alert, well appearing, and in no distress Mental status - normal mood, behavior, speech, dress, motor activity, and thought processes Nose - normal and patent, no erythema, discharge or polyps Mouth - mucous membranes moist, pharynx normal without lesions Chest - clear to auscultation, no wheezes, rales or rhonchi, symmetric air entry Heart - normal rate, regular rhythm, normal S1, S2, no murmurs, rubs, clicks or gallops Extremities - peripheral pulses normal in both upper and lower extremities, no pedal edema, no clubbing or cyanosis.  Good capillary refill Skin -fishnet hyperpigmented dermatitis noted on the posterior aspect of the left and right legs.  Please refer to pictures below      ASSESSMENT  AND PLAN: 1. Dermatitis of lower extremity Dermatitis does not appear to be bruising.  Differential diagnosis include livedo reticularis, stretch marks versus vasculitis.  Patient has not been using any steroid cream on the lower extremities.  We will check some baseline labs including CBC, CMP and a sed rate - Sedimentation Rate - Ambulatory referral to Dermatology   Patient was given the opportunity to ask questions.  Patient verbalized understanding of the plan and was able to repeat key elements of the plan.   No orders of the defined types were placed in this encounter.    Requested Prescriptions    No prescriptions requested or ordered in this encounter     Future Appointments  Date Time Provider Department Center  04/25/2019  8:15 AM George Hugh, NP [facility] None  04/26/2019  3:00 PM [facility] PA/NP [facility] None    Jonah Blue, MD, Jerrel Ivory

## 2019-04-13 LAB — CBC
Hematocrit: 38.9 % (ref 34.0–46.6)
Hemoglobin: 12.7 g/dL (ref 11.1–15.9)
MCH: 28.3 pg (ref 26.6–33.0)
MCHC: 32.6 g/dL (ref 31.5–35.7)
MCV: 87 fL (ref 79–97)
Platelets: 282 10*3/uL (ref 150–450)
RBC: 4.49 x10E6/uL (ref 3.77–5.28)
RDW: 13.9 % (ref 11.7–15.4)
WBC: 5.9 10*3/uL (ref 3.4–10.8)

## 2019-04-13 LAB — COMPREHENSIVE METABOLIC PANEL
ALT: 11 IU/L (ref 0–32)
AST: 13 IU/L (ref 0–40)
Albumin/Globulin Ratio: 1.3 (ref 1.2–2.2)
Albumin: 4.4 g/dL (ref 3.8–4.8)
Alkaline Phosphatase: 67 IU/L (ref 39–117)
BUN/Creatinine Ratio: 12 (ref 9–23)
BUN: 11 mg/dL (ref 6–24)
Bilirubin Total: 0.3 mg/dL (ref 0.0–1.2)
CO2: 25 mmol/L (ref 20–29)
Calcium: 10.2 mg/dL (ref 8.7–10.2)
Chloride: 103 mmol/L (ref 96–106)
Creatinine, Ser: 0.94 mg/dL (ref 0.57–1.00)
GFR calc Af Amer: 84 mL/min/{1.73_m2} (ref >59)
GFR calc non Af Amer: 73 mL/min/{1.73_m2} (ref >59)
Globulin, Total: 3.5 g/dL (ref 1.5–4.5)
Glucose: 87 mg/dL (ref 65–99)
Potassium: 5 mmol/L (ref 3.5–5.2)
Sodium: 142 mmol/L (ref 134–144)
Total Protein: 7.9 g/dL (ref 6.0–8.5)

## 2019-04-13 LAB — SEDIMENTATION RATE: Sed Rate: 23 mm/hr (ref 0–32)

## 2019-04-20 ENCOUNTER — Telehealth: Payer: Self-pay

## 2019-04-20 NOTE — Telephone Encounter (Signed)
Spoke with the patient and she has given verbal consent to file her insurance and to do a doxy[employer]me visit. E-mail, mobile number and carrier has been confirmed and sent.  E-mail: [email or web address] Text: [phone] Roetta Sessions)

## 2019-04-25 ENCOUNTER — Encounter: Payer: Self-pay | Admitting: Adult Health

## 2019-04-25 ENCOUNTER — Ambulatory Visit (INDEPENDENT_AMBULATORY_CARE_PROVIDER_SITE_OTHER): Payer: BLUE CROSS/BLUE SHIELD | Admitting: Adult Health

## 2019-04-25 DIAGNOSIS — I10 Essential (primary) hypertension: Secondary | ICD-10-CM

## 2019-04-25 DIAGNOSIS — I6381 Other cerebral infarction due to occlusion or stenosis of small artery: Secondary | ICD-10-CM | POA: Diagnosis not present

## 2019-04-25 DIAGNOSIS — E785 Hyperlipidemia, unspecified: Secondary | ICD-10-CM

## 2019-04-25 NOTE — Progress Notes (Signed)
I agree with the above plan 

## 2019-04-25 NOTE — Progress Notes (Signed)
[facility] [address] [place].  [address] [phone]       FOLLOW UP NOTE  Catherine Gross Date of Birth:  Jul 24, 1971 Medical Record Number:  [id number]   Reason for visit: Stroke follow up   Virtual Visit via Video Note  I connected with Catherine Gross on 04/25/19 at  8:15 AM EDT by a video enabled telemedicine application located remotely in my own home and verified that I am speaking with the correct person using two identifiers who was located at their own home.   I discussed the limitations of evaluation and management by telemedicine and the availability of in person appointments. The patient expressed understanding and agreed to proceed.    HPI: Catherine Gross initially had stroke follow-up office visit for left thalamic infarct secondary to small vessel disease on 06/22/2018 but due to COVID-19 safety precautions, visit transition to telemedicine via [email or web address] with patient's consent.  Catherine Gross is a 48 y.o. female with history of HTN and CM  who presented with R arm tingling.  Stroke work-up revealed left ICA/thalamic lacunar infarct as evidenced on MRI.  Chronic infarcts also seen on MRI.  CTA head and neck showed advanced arthrosclerosis for age along with bilateral cervical ICAs with beaded appearance and fusiform aneurysmal left ICA bulb and left ECA trunk. 2D echo unremarkable. LDL 178 and recommended change to lipitor 80.  A1c stable at 5.8.  Recommended DAPT for 3 weeks and then aspirin alone.  Patient was discharged home in stable condition without residual deficits.  She continues to do well from a stroke standpoint without residual deficits or reoccurring of symptoms.  Continues on aspirin 81 mg and atorvastatin 80 mg without side effects.  Blood pressure stable. Currently working from home due to current pandemic. No further concerns at this time.  Denies new or worsening stroke/TIA symptoms.     ROS:   14 system  review of systems performed and negative with exception of no complaints  PMH:  Past Medical History:  Diagnosis Date  . Benign essential HTN   . Cardiomyopathy (HCC)   . Mitral regurgitation   . Preeclampsia    first pregnancy, not with susequent pregnancy  . Stroke [facility])     PSH:  Past Surgical History:  Procedure Laterality Date  . CESAREAN SECTION    . RIGHT/LEFT HEART CATH AND CORONARY ANGIOGRAPHY N/A 09/02/2017   Procedure: RIGHT/LEFT HEART CATH AND CORONARY ANGIOGRAPHY;  Surgeon: Laurey Morale, MD;  Location[facility] INVASIVE CV LAB;  Service: Cardiovascular;  Laterality: N/A;  . TEE WITHOUT CARDIOVERSION N/A 09/02/2017   Procedure: TRANSESOPHAGEAL ECHOCARDIOGRAM (TEE);  Surgeon: Laurey Morale, MD;  Location: [facility] ENDOSCOPY;  Service: Cardiovascular;  Laterality: N/A;    Social History:  Social History   Socioeconomic History  . Marital status: Single    Spouse name: Not on file  . Number of children: Not on file  . Years of education: Not on file  . Highest education level: Not on file  Occupational History  . Not on file  Social Needs  . Financial resource strain: Not on file  . Food insecurity:    Worry: Not on file    Inability: Not on file  . Transportation needs:    Medical: Not on file    Non-medical: Not on file  Tobacco Use  . Smoking status: Former Smoker    Packs/day: 0.50    Years: 20.00    Pack years: 10.00  Last attempt to quit: 03/22/2015    Years since quitting: 4.0  . Smokeless tobacco: Never Used  Substance and Sexual Activity  . Alcohol use: No  . Drug use: No  . Sexual activity: Not on file  Lifestyle  . Physical activity:    Days per week: Not on file    Minutes per session: Not on file  . Stress: Not on file  Relationships  . Social connections:    Talks on phone: Not on file    Gets together: Not on file    Attends religious service: Not on file    Active member of club or organization: Not on file    Attends meetings of  clubs or organizations: Not on file    Relationship status: Not on file  . Intimate partner violence:    Fear of current or ex partner: Not on file    Emotionally abused: Not on file    Physically abused: Not on file    Forced sexual activity: Not on file  Other Topics Concern  . Not on file  Social History Narrative  . Not on file    Family History:  Family History  Problem Relation Age of Onset  . Heart failure Father   . Hypertension Father   . Congestive Heart Failure Father   . Alzheimer's disease Mother   . Asthma Brother     Medications:   Current Outpatient Medications on File Prior to Visit  Medication Sig Dispense Refill  . aspirin EC 81 MG EC tablet Take 1 tablet (81 mg total) by mouth daily. 30 tablet 0  . atorvastatin (LIPITOR) 80 MG tablet Take 1 tablet (80 mg total) by mouth daily at 6 PM. 90 tablet 3  . Blood Pressure Monitor DEVI Use as directed to check home blood pressure 2-3 times a week 1 Device 0  . carvedilol (COREG) 25 MG tablet Take 1 tablet (25 mg total) by mouth 2 (two) times daily with a meal. 180 tablet 3  . ferrous sulfate (FERROUSUL) 325 (65 FE) MG tablet Take 1 tablet (325 mg total) by mouth daily with breakfast. 100 tablet 1  . furosemide (LASIX) 40 MG tablet Take 1 tablet (40 mg total) by mouth daily. 90 tablet 1  . potassium chloride SA (K-DUR,KLOR-CON) 20 MEQ tablet Take 2 tablets (40 mEq total) by mouth daily. 60 tablet 5  . sacubitril-valsartan (ENTRESTO) 97-103 MG Take 1 tablet by mouth 2 (two) times daily. 180 tablet 3  . spironolactone (ALDACTONE) 25 MG tablet Take 1 tablet (25 mg total) by mouth daily. 90 tablet 3   No current facility-administered medications on file prior to visit.     Allergies:  No Known Allergies   Physical Exam  General: well developed, well nourished, pleasant middle-aged African-American female, seated, in no evident distress Head: head normocephalic and atraumatic.    Neurologic Exam Mental Status:  Awake and fully alert. Oriented to place and time. Recent and remote memory intact. Attention span, concentration and fund of knowledge appropriate. Mood and affect appropriate.  Cranial Nerves: Extraocular movements full without nystagmus. Hearing intact to voice. Facial sensation intact. Face, tongue, palate moves normally and symmetrically.  Shoulder shrug symmetric. Motor: No evidence of weakness per drift assessment Sensory.: intact to light touch Coordination: Rapid alternating movements normal in all extremities. Finger-to-nose and heel-to-shin performed accurately bilaterally. Gait and Station: Arises from chair without difficulty. Stance is normal. Gait demonstrates normal stride length and balance .  Reflexes: UTA  Diagnostic Data (Labs, Imaging, Testing)  CT head without contrast 06/22/2018 IMPRESSION: Nonspecific low-density throughout the deep white matter. This could reflect chronic microvascular ischemic changes or other demyelinating process such as vasculitis or multiple sclerosis. This could be further evaluated with MRI if felt clinically indicated.  MRI brain without contrast 06/22/2018 IMPRESSION: 1. Acute lacunar infarct in the posterior limb of the left internal capsule/lateral thalamus. 2. Markedly age advanced chronic small vessel ischemic disease with multiple chronic lacunar infarcts as above.  CTA head/neck with and without contrast 06/23/2018 IMPRESSION: 1. Aortic arch, right cervical carotid, and bilateral ICA siphon atherosclerosis seems advanced for age. But there is no arterial stenosis, and no large vessel or circle-of-Willis branch occlusion. 2. Positive for abnormal appearance of both cervical carotid arteries favored to reflect a spectrum of fibromuscular dysplasia (FMD): - irregular beaded appearance plus possible faint carotid web in the right ICA just below the skull base (series 8, image 110). - ectatic/fusiform aneurysmal left ICA bulb  and left ECA trunk (up to 11 mm diameter). 3. Negative anterior circulation branches. Negative vertebral arteries and posterior circulation. 4. No new intracranial abnormality.   ASSESSMENT: Catherine Gross is a 48 y.o. year old female here with left ICA/lateral thalamic infarct on 06/22/2018 secondary to small vessel disease. Vascular risk factors include HTN and HLD.  She continues to do well from a stroke standpoint without residual deficits or reoccurring symptoms.     PLAN: -Continue aspirin 81 mg daily  and Lipitor 80 mg for secondary stroke prevention   -F/u with PCP regarding your HLD and HTN management  -continue to monitor BP at home -Advised to continue to stay active and maintain a healthy diet -Maintain strict control of hypertension with blood pressure goal below 130/90, diabetes with hemoglobin A1c goal below 6.5% and cholesterol with LDL cholesterol (bad cholesterol) goal below 70 mg/dL. I also advised the patient to eat a healthy diet with plenty of whole grains, cereals, fruits and vegetables, exercise regularly and maintain ideal body weight.  Stable from stroke standpoint and recommend follow-up as needed   Greater than 50% of time during this 25 minute non-face-to-face visit was spent on counseling,explanation of diagnosis of left ICA/lateral thalamic infarct, reviewing risk factor management of HLD and HTN, planning of further management, discussion with patient and answering all questions to patient satisfaction    George Hugh, AGNP-BC  [facility] [address] [place], [place] [address]  Phone [phone] Fax [phone]

## 2019-04-26 ENCOUNTER — Ambulatory Visit (HOSPITAL_COMMUNITY)
Admission: RE | Admit: 2019-04-26 | Discharge: 2019-04-26 | Disposition: A | Discharge status: Home / Self Care | Payer: BLUE CROSS/BLUE SHIELD | Source: Ambulatory Visit | Attending: Cardiology | Admitting: Cardiology

## 2019-04-26 ENCOUNTER — Other Ambulatory Visit: Payer: Self-pay

## 2019-04-26 VITALS — Wt 174.0 lb

## 2019-04-26 DIAGNOSIS — I5022 Chronic systolic (congestive) heart failure: Secondary | ICD-10-CM | POA: Diagnosis not present

## 2019-04-26 DIAGNOSIS — I340 Nonrheumatic mitral (valve) insufficiency: Secondary | ICD-10-CM

## 2019-04-26 DIAGNOSIS — I10 Essential (primary) hypertension: Secondary | ICD-10-CM

## 2019-04-26 NOTE — Addendum Note (Signed)
Encounter addended by: Marisa Hua, RN on: 04/26/2019 3:52 PM  Actions taken: Order list changed, Diagnosis association updated, Clinical Note Signed

## 2019-04-26 NOTE — Progress Notes (Signed)
Heart Failure TeleHealth Note  Due to national recommendations of social distancing due to COVID 19, telehealth visit is felt to be most appropriate for this patient at this time.  I discussed the limitations, risks, security and privacy concerns of performing an evaluation and management service by telephone and the availability of in person appointments. I also discussed with the patient that there may be a patient responsible charge related to this service. The patient expressed understanding and agreed to proceed.   ID:  Catherine Gross, Catherine Gross [birth year], MRN [id number]  Location: Home  Provider location: [address], [place] Type of Visit: Established patient   PCP:  Marcine Matar, MD  Cardiologist:  No primary care provider on file. Primary HF: Dr Shirlee Latch  Chief Complaint: HF follow up   History of Present Illness: Catherine Gross is a 48 y.o. female with a history of cardiomyopathy of uncertain etiology, HTN, and severe mitral regurgitation returns for followup of CHF and MR.  She has had a history of HTN for about 5 years or so.  She had pre-eclampsia with a pregnancy.  In 2016, she had an echo showing EF 35% with diffuse hypokinesis and severe MR.  Workup for secondary hypertension at that time showed elevated urinary metanephrines.  There was concern for pheochromocytoma, but it appears that this was not followed up at the time.  She had a repeat echo in 2/18 with EF 35-40% and again, severe MR.  Cardiac MRI in 7/18 showed EF 44%, severe MR, and no myocardial late gadolinium enhancement.  She had repeat urinary metanephrines => this actually was normal (8/18), serum metanephrines in 9/18 were also normal.   She had right and left heart cath in 9/18, showing no significant CAD and filling pressures were optimized.  TEE in 9/18 showed improvement in EF to 50% with moderate MR by PISA and vena contracta, moderate-severe visually.  Echo in 7/19 showed EF 55-60%,  mild to moderate AI, moderate MR.   In 7/19, she was admitted with acute lacunar infarct.  CTA neck showed pattern in the carotid arteries that was concerning for fibromuscular dysplasia. Renal artery dopplers in 10/19 did not show evidence for renal artery stenosis.   Patient presents via audio conferencing for a telehealth visit today. Overall doing well. No SOB, edema, orthopnea, or PND. No cough, fever, or dizziness. No problems with getting medications. Limits fluid and salt intake. Weights stable at 174 lbs. SBP 110s on home checks. Working from home. No food insecurity.  Pt denies symptoms of cough, fevers, chills, or new SOB worrisome for COVID 19.    - Echo (4/16): EF 35%, mild LV dilation, mild LVH, mild AI, PASP 49 mmHg, severe MR.  - Echo (2/18): EF 35-40%, moderate LV dilation, diffuse hypokinesis, moderate AI, severe MR, normal RV size and systolic function, moderate TR, moderate PI.  - Cardiac MRI (7/18): EF 44% with mild LV dilation, mild LVH, severe central MR, no late gadolinium enhancement noted.  - LHC/RHC (9/18): No significant CAD; mean RA 6, PA 28/12, mean PCWP 10, 3+ MR.  - TEE (9/18): TEE 50%, mild LV dilation, visually moderate to severe MR but moderate by PISA (ERO 0.34) and vena contracta (0.55 cm), normal RV size and systolic function, peak RV-RA gradient 46 mmHg.  - Echo (7/19): EF 55-60%, mild LV dilation, no AS, mild-moderate AI, moderate MR, normal RV, severe LAE.   Past Medical History:  Diagnosis Date  . Benign  essential HTN   . Cardiomyopathy (HCC)   . Mitral regurgitation   . Preeclampsia    first pregnancy, not with susequent pregnancy  . Stroke [facility])    Past Surgical History:  Procedure Laterality Date  . CESAREAN SECTION    . RIGHT/LEFT HEART CATH AND CORONARY ANGIOGRAPHY N/A 09/02/2017   Procedure: RIGHT/LEFT HEART CATH AND CORONARY ANGIOGRAPHY;  Surgeon: Laurey Morale, MD;  Location: [facility] INVASIVE CV LAB;  Service: Cardiovascular;  Laterality:  N/A;  . TEE WITHOUT CARDIOVERSION N/A 09/02/2017   Procedure: TRANSESOPHAGEAL ECHOCARDIOGRAM (TEE);  Surgeon: Laurey Morale, MD;  Location: [facility] ENDOSCOPY;  Service: Cardiovascular;  Laterality: N/A;     Current Outpatient Medications  Medication Sig Dispense Refill  . aspirin EC 81 MG EC tablet Take 1 tablet (81 mg total) by mouth daily. 30 tablet 0  . atorvastatin (LIPITOR) 80 MG tablet Take 1 tablet (80 mg total) by mouth daily at 6 PM. 90 tablet 3  . carvedilol (COREG) 25 MG tablet Take 1 tablet (25 mg total) by mouth 2 (two) times daily with a meal. 180 tablet 3  . ferrous sulfate (FERROUSUL) 325 (65 FE) MG tablet Take 1 tablet (325 mg total) by mouth daily with breakfast. 100 tablet 1  . furosemide (LASIX) 40 MG tablet Take 1 tablet (40 mg total) by mouth daily. 90 tablet 1  . potassium chloride SA (K-DUR,KLOR-CON) 20 MEQ tablet Take 2 tablets (40 mEq total) by mouth daily. (Patient taking differently: Take 60 mEq by mouth daily. ) 60 tablet 5  . sacubitril-valsartan (ENTRESTO) 97-103 MG Take 1 tablet by mouth 2 (two) times daily. 180 tablet 3  . spironolactone (ALDACTONE) 25 MG tablet Take 1 tablet (25 mg total) by mouth daily. 90 tablet 3  . Blood Pressure Monitor DEVI Use as directed to check home blood pressure 2-3 times a week 1 Device 0   No current facility-administered medications for this encounter.     Allergies:   Patient has no known allergies.   Social History:  The patient  reports that she quit smoking about 4 years ago. She has a 10.00 pack-year smoking history. She has never used smokeless tobacco. She reports that she does not drink alcohol or use drugs.   Family History:  The patient's family history includes Alzheimer's disease in her mother; Asthma in her brother; Congestive Heart Failure in her father; Heart failure in her father; Hypertension in her father.   ROS:  Please see the history of present illness.   All other systems are personally reviewed and  negative.    Exam:  (Video/Tele Health Call; Exam is subjective and or/visual.) General:  Speaks in full sentences. No resp difficulty. Lungs: Normal respiratory effort with conversation.  Abdomen: No distension per patient report Extremities: Pt denies edema. Neuro: Alert & oriented x 3.   Recent Labs: 04/12/2019: ALT 11; BUN 11; Creatinine, Ser 0.94; Hemoglobin 12.7; Platelets 282; Potassium 5.0; Sodium 142  Personally reviewed   Wt Readings from Last 3 Encounters:  04/26/19 78.9 kg (174 lb)  04/12/19 81.1 kg (178 lb 12.8 oz)  01/11/19 83.6 kg (184 lb 3.2 oz)      ASSESSMENT AND PLAN:  1. HTN:  Long standing.  She had carotid artery pattern that was consistent with FMD, but renal artery dopplers in 10/19 were not suggestive of renal artery stenosis.  Repeat workup for pheochromocytoma is negative.   - SBP 110s on home checks.  - Continue Coreg 25 mg bid .  -  Continue Entresto 97/103 bid.  - Continue spironolactone 25 daily.   2. Chronic systolic CHF: Nonischemic cardiomyopathy (no significant CAD on 9/18 cath).  EF 35-40% by 2/18 echo and 44% by 7/18 cMRI. No late gadolinium enhancement on MRI. Etiology uncertain.  It is possible that she could have a cardiomyopathy due to long-standing severe MR (was present on initial echo in 4/16), but evaluation has not appeared to show structural problems with the MV so MR may be functional and and related to the cardiomyopathy. Possible hypertensive cardiomyopathy.  EF improved to 50% on 9/18 TEE, 55-60% on 7/19 echo.  NYHA class I-II symptoms. Volume sounds stable.  - Continue Entresto 97/103 mg BID - Continue spiro 25 mg daily  - Continue lasix 40 mg daily. She has been taking 60 meq K daily. Last K was 5.0 on 5/5. Decrease K back to 40 meq daily.  - Continue Coreg 25 mg bid.  - Repeat echo at next visit.  3. Mitral regurgitation: Severe MR noted on imaging back to 2016.  TEE 9/18 with probably moderate MR in the setting of better BP control  => moderate by PISA and vena contracta.  The MV appears structurally normal so suspect functional MR likely due to cardiomyopathy.  Most recent echo in 7/19 with moderate MR.  4. Lacunar infarct in 7/19:  Most likely related to HTN.  5.Possible fibromuscular dysplasia: Appearance of carotids on CTA was highly suggesitive of FMD. CT chest/abdomen/pelvis in 10/19 for screening purposes did not show aneurysm or dissection. Renal artery dopplers were not suggestive of renal artery stenosis or FMD involvement.    COVID screen The patient does not have any symptoms that suggest any further testing/ screening at this time.  Social distancing reinforced today.  Patient Risk: After full review of this patients clinical status, I feel that they are at moderate risk for cardiac decompensation at this time.  Orders/Follow up: Follow up 3-4 months with Dr Shirlee Latch. Decrease K back to 40 meq daily  Today, I have spent 8 minutes with the patient with telehealth technology discussing CHF and HTN.    Signed, Alford Highland, NP  04/26/2019 3:31 PM   [facility] [address] Heart [facility] Vascular [place] [address] [phone] (office) [phone] (fax)

## 2019-04-26 NOTE — Patient Instructions (Addendum)
Decrease potassium supplement to 40 meq (2 tabs) daily   Follow up 3-4 months with Dr Shirlee Latch with echo.   Your physician has requested that you have an echocardiogram. Echocardiography is a painless test that uses sound waves to create images of your heart. It provides your doctor with information about the size and shape of your heart and how well your heart's chambers and valves are working. This procedure takes approximately one hour. There are no restrictions for this procedure.

## 2019-04-26 NOTE — Progress Notes (Signed)
LM for patient re: AVS.  AVS mailed.

## 2019-04-26 NOTE — Addendum Note (Signed)
Encounter addended by: Marisa Hua, RN on: 04/26/2019 3:53 PM  Actions taken: Clinical Note Signed

## 2019-04-27 ENCOUNTER — Telehealth (HOSPITAL_COMMUNITY): Payer: Self-pay | Admitting: Cardiology

## 2019-04-27 NOTE — Telephone Encounter (Signed)
Called patient and left message with Echo and f/u appt with Dr. Shirlee Latch for 07/12/2019.  Asked pt to call back to confirm appt info.

## 2019-04-27 NOTE — Telephone Encounter (Signed)
Pt returned my call and is aware of Echo and f/u appt with Dr. Shirlee Latch.

## 2019-05-27 DIAGNOSIS — R21 Rash and other nonspecific skin eruption: Secondary | ICD-10-CM | POA: Diagnosis not present

## 2019-05-27 DIAGNOSIS — D225 Melanocytic nevi of trunk: Secondary | ICD-10-CM | POA: Diagnosis not present

## 2019-07-14 ENCOUNTER — Other Ambulatory Visit (HOSPITAL_COMMUNITY): Payer: BLUE CROSS/BLUE SHIELD

## 2019-07-14 ENCOUNTER — Encounter (HOSPITAL_COMMUNITY): Payer: BLUE CROSS/BLUE SHIELD | Admitting: Cardiology

## 2019-07-15 ENCOUNTER — Ambulatory Visit: Payer: BLUE CROSS/BLUE SHIELD | Admitting: Internal Medicine

## 2019-08-30 ENCOUNTER — Other Ambulatory Visit: Payer: Self-pay | Admitting: Adult Health

## 2019-08-31 ENCOUNTER — Telehealth (HOSPITAL_COMMUNITY): Payer: Self-pay | Admitting: Pharmacy Technician

## 2019-08-31 NOTE — Telephone Encounter (Signed)
Patient Advocate Encounter   Received notification from [place] that prior authorization for Entresto 49-51mg  is required.   PA submitted on CoverMyMeds Key [id number] Status is pending   Will continue to follow.  Charlann Boxer, CPhT

## 2019-09-06 NOTE — Telephone Encounter (Signed)
Received notification that PA was cancelled. Patient has exceeded retail fills. Called patient plan and they said that she needs to either go to [facility] or their mail order pharmacy. From previous conversations I know patient prefers to get her medication from the local [place].   Called and provided patient phone number for her to call and give the okay to continue getting it from [facility]. Patient informed me that she has only been taking one tablet a day, when I inquired as to why she was doing that, she told me she would just forget to take the evening dose. I encouraged her to take two tablets daily and to set an alarm for the evening to help her remember. Patient was agreeable to doing so and said she would set an alarm when we got off the phone. Advised her to call me with any issues.   Phone # [place], CPhT

## 2019-09-12 ENCOUNTER — Other Ambulatory Visit: Payer: Self-pay

## 2019-09-12 ENCOUNTER — Ambulatory Visit (HOSPITAL_COMMUNITY)
Admission: RE | Admit: 2019-09-12 | Discharge: 2019-09-12 | Disposition: A | Discharge status: Home / Self Care | Payer: BC Managed Care – PPO | Source: Ambulatory Visit | Attending: Cardiology | Admitting: Cardiology

## 2019-09-12 ENCOUNTER — Ambulatory Visit (HOSPITAL_BASED_OUTPATIENT_CLINIC_OR_DEPARTMENT_OTHER)
Admission: RE | Admit: 2019-09-12 | Discharge: 2019-09-12 | Disposition: A | Discharge status: Home / Self Care | Payer: BC Managed Care – PPO | Source: Ambulatory Visit | Attending: Cardiology | Admitting: Cardiology

## 2019-09-12 ENCOUNTER — Encounter (HOSPITAL_COMMUNITY): Payer: Self-pay | Admitting: Cardiology

## 2019-09-12 VITALS — BP 112/62 | HR 80 | Wt 166.2 lb

## 2019-09-12 DIAGNOSIS — I340 Nonrheumatic mitral (valve) insufficiency: Secondary | ICD-10-CM | POA: Diagnosis not present

## 2019-09-12 DIAGNOSIS — I110 Hypertensive heart disease with heart failure: Secondary | ICD-10-CM | POA: Diagnosis not present

## 2019-09-12 DIAGNOSIS — Z79899 Other long term (current) drug therapy: Secondary | ICD-10-CM | POA: Diagnosis not present

## 2019-09-12 DIAGNOSIS — I083 Combined rheumatic disorders of mitral, aortic and tricuspid valves: Secondary | ICD-10-CM | POA: Insufficient documentation

## 2019-09-12 DIAGNOSIS — I5022 Chronic systolic (congestive) heart failure: Secondary | ICD-10-CM | POA: Insufficient documentation

## 2019-09-12 DIAGNOSIS — E785 Hyperlipidemia, unspecified: Secondary | ICD-10-CM | POA: Insufficient documentation

## 2019-09-12 DIAGNOSIS — I773 Arterial fibromuscular dysplasia: Secondary | ICD-10-CM | POA: Insufficient documentation

## 2019-09-12 DIAGNOSIS — Z8673 Personal history of transient ischemic attack (TIA), and cerebral infarction without residual deficits: Secondary | ICD-10-CM | POA: Diagnosis not present

## 2019-09-12 DIAGNOSIS — I10 Essential (primary) hypertension: Secondary | ICD-10-CM | POA: Diagnosis not present

## 2019-09-12 DIAGNOSIS — I428 Other cardiomyopathies: Secondary | ICD-10-CM | POA: Insufficient documentation

## 2019-09-12 DIAGNOSIS — Z7982 Long term (current) use of aspirin: Secondary | ICD-10-CM | POA: Diagnosis not present

## 2019-09-12 DIAGNOSIS — Z8249 Family history of ischemic heart disease and other diseases of the circulatory system: Secondary | ICD-10-CM | POA: Diagnosis not present

## 2019-09-12 LAB — BASIC METABOLIC PANEL
Anion gap: 9 (ref 5–15)
BUN: 5 mg/dL — ABNORMAL LOW (ref 6–20)
CO2: 24 mmol/L (ref 22–32)
Calcium: 9.4 mg/dL (ref 8.9–10.3)
Chloride: 108 mmol/L (ref 98–111)
Creatinine, Ser: 0.65 mg/dL (ref 0.44–1.00)
GFR calc Af Amer: >60 mL/min (ref >60)
GFR calc non Af Amer: >60 mL/min (ref >60)
Glucose, Bld: 100 mg/dL — ABNORMAL HIGH (ref 70–99)
Potassium: 4.5 mmol/L (ref 3.5–5.1)
Sodium: 141 mmol/L (ref 135–145)

## 2019-09-12 LAB — LIPID PANEL
Cholesterol: 230 mg/dL — ABNORMAL HIGH (ref 0–200)
HDL: 41 mg/dL (ref >40)
LDL Cholesterol: 175 mg/dL — ABNORMAL HIGH (ref 0–99)
Total CHOL/HDL Ratio: 5.6 RATIO
Triglycerides: 69 mg/dL (ref <150)
VLDL: 14 mg/dL (ref 0–40)

## 2019-09-12 MED ORDER — FUROSEMIDE 40 MG PO TABS: 20.0000 mg | ORAL_TABLET | Freq: Every day | ORAL | 1 refills | Status: DC

## 2019-09-12 MED ORDER — POTASSIUM CHLORIDE CRYS ER 20 MEQ PO TBCR: 20.0000 meq | EXTENDED_RELEASE_TABLET | Freq: Every day | ORAL | 5 refills | Status: DC

## 2019-09-12 NOTE — Progress Notes (Signed)
[facility] Note   PCP: Ladell Pier, MD HF Cardiology: Dr. Aundra Dubin  48 y.o. with history of cardiomyopathy of uncertain etiology, HTN, and severe mitral regurgitation returns for followup of CHF and MR.  She has had a history of HTN for about 5 years or so.  She had pre-eclampsia with a pregnancy.  In 2016, she had an echo showing EF 35% with diffuse hypokinesis and severe MR.  Workup for secondary hypertension at that time showed elevated urinary metanephrines.  There was concern for pheochromocytoma, but it appears that this was not followed up at the time.  She had a repeat echo in 2/18 with EF 35-40% and again, severe MR.  Cardiac MRI in 7/18 showed EF 44%, severe MR, and no myocardial late gadolinium enhancement.  She had repeat urinary metanephrines => this actually was normal (8/18), serum metanephrines in 9/18 were also normal.   She had right and left heart cath in 9/18, showing no significant CAD and filling pressures were optimized.  TEE in 9/18 showed improvement in EF to 50% with moderate MR by PISA and vena contracta, moderate-severe visually.  Echo in 7/19 showed EF 55-60%, mild to moderate AI, moderate MR.   In 7/19, she was admitted with acute lacunar infarct.  CTA neck showed pattern in the carotid arteries that was concerning for fibromuscular dysplasia. Renal artery dopplers in 10/19 did not show evidence for renal artery stenosis.   Echo was done today and reviewed, EF 50-55%, mild LVH, normal RV size and systolic function, moderate MR, mild AI.   She presents today for followup of CHF and HTN.  BP is now controlled.  She has lost 26 lbs since last appointment with diet/exercise. She is walking daily.  No dyspnea on her walks, she does get short of breath mildly carrying groceries up to her 3rd floor apartment.  No chest pain.  No stroke-like symptoms.   Labs (4/16): Elevated urinary normetanephrine and total metanephrines.  Labs (5/16): HIV  negative Labs (4/18): K 3.7, creatinine 0.76, LDL 194, TSH normal Labs (8/18): Normal urinary metanephrines Labs (9/18): serum metanephrines normal, hgb 11.1, K 3.6, creatinine 0.74 Labs (7/19): LDL 178 Labs (8/19): creatinine 0.94 Labs (12/19): K 3.6, creatinine 0.75 Labs (5/20): K 5, creatinine 0.94  PMH: 1. HTN: Evaluation in 2016 was concerning for pheochromocytoma with elevated urinary metanephrines but no followup.  8/18 24 hour urine collection actually showed normal urinary metanephrines.  - Renal artery dopplers in 10/19 with no evidence for renal artery stenosis.  2. Hyperlipidemia 3. Chronic systolic CHF: Nonischemic cardiomyopathy - Echo (4/16): EF 35%, mild LV dilation, mild LVH, mild AI, PASP 49 mmHg, severe MR.  - Echo (2/18): EF 35-40%, moderate LV dilation, diffuse hypokinesis, moderate AI, severe MR, normal RV size and systolic function, moderate TR, moderate PI.  - Cardiac MRI (7/18): EF 44% with mild LV dilation, mild LVH, severe central MR, no late gadolinium enhancement noted.  - LHC/RHC (9/18): No significant CAD; mean RA 6, PA 28/12, mean PCWP 10, 3+ MR.  - TEE (9/18): TEE 50%, mild LV dilation, visually moderate to severe MR but moderate by PISA (ERO 0.34) and vena contracta (0.55 cm), normal RV size and systolic function, peak RV-RA gradient 46 mmHg.  - Echo (7/19): EF 55-60%, mild LV dilation, no AS, mild-moderate AI, moderate MR, normal RV, severe LAE.  - Echo (10/20):  EF 50-55%, mild LVH, normal RV size and systolic function, moderate MR, mild AI. 4. Mitral regurgitation:  Severe by echo and cardiac MRI, appears to be central.  - TEE 9/18 with probably moderate MR (by PISA and vena contracta). MR central, likely functional. - Echo (7/19): Moderate MR.   - Echo (10/29): Moderate MR.  5. Lacunar CVA: 7/19.  6. Fibromuscular dysplasia: CTA head/neck 7/19 showed pattern in the carotid arteries concerning for FMD.  - Renal artery dopplers (10/19): no evidence for  renal artery stenosis or mesenteric artery stenosis.   Social History   Socioeconomic History   Marital status: Single    Spouse name: Not on file   Number of children: Not on file   Years of education: Not on file   Highest education level: Not on file  Occupational History   Not on file  Social Needs   Financial resource strain: Not on file   Food insecurity    Worry: Not on file    Inability: Not on file   Transportation needs    Medical: Not on file    Non-medical: Not on file  Tobacco Use   Smoking status: Former Smoker    Packs/day: 0.50    Years: 20.00    Pack years: 10.00    Quit date: 03/22/2015    Years since quitting: 4.4   Smokeless tobacco: Never Used  Substance and Sexual Activity   Alcohol use: No   Drug use: No   Sexual activity: Not on file  Lifestyle   Physical activity    Days per week: Not on file    Minutes per session: Not on file   Stress: Not on file  Relationships   Social connections    Talks on phone: Not on file    Gets together: Not on file    Attends religious service: Not on file    Active member of club or organization: Not on file    Attends meetings of clubs or organizations: Not on file    Relationship status: Not on file   Intimate partner violence    Fear of current or ex partner: Not on file    Emotionally abused: Not on file    Physically abused: Not on file    Forced sexual activity: Not on file  Other Topics Concern   Not on file  Social History Narrative   Not on file   Family History  Problem Relation Age of Onset   Heart failure Father    Hypertension Father    Congestive Heart Failure Father    Alzheimer's disease Mother    Asthma Brother    Review of systems complete and found to be negative unless listed in HPI.    Current Outpatient Medications  Medication Sig Dispense Refill   aspirin EC 81 MG EC tablet Take 1 tablet (81 mg total) by mouth daily. 30 tablet 0   atorvastatin  (LIPITOR) 80 MG tablet Take 1 tablet (80 mg total) by mouth daily at 6 PM. 90 tablet 3   Blood Pressure Monitor DEVI Use as directed to check home blood pressure 2-3 times a week 1 Device 0   carvedilol (COREG) 25 MG tablet Take 1 tablet (25 mg total) by mouth 2 (two) times daily with a meal. 180 tablet 3   ferrous sulfate (FERROUSUL) 325 (65 FE) MG tablet Take 1 tablet (325 mg total) by mouth daily with breakfast. 100 tablet 1   furosemide (LASIX) 40 MG tablet Take 0.5 tablets (20 mg total) by mouth daily. 45 tablet 1   potassium chloride SA (KLOR-CON)  20 MEQ tablet Take 1 tablet (20 mEq total) by mouth daily. 30 tablet 5   sacubitril-valsartan (ENTRESTO) 97-103 MG Take 1 tablet by mouth 2 (two) times daily. 180 tablet 3   spironolactone (ALDACTONE) 25 MG tablet Take 1 tablet (25 mg total) by mouth daily. 90 tablet 3   No current facility-administered medications for this encounter.    Vitals:   09/12/19 0941  BP: 112/62  Pulse: 80  SpO2: 97%  Weight: 75.4 kg (166 lb 3.2 oz)   Wt Readings from Last 3 Encounters:  09/12/19 75.4 kg (166 lb 3.2 oz)  04/26/19 78.9 kg (174 lb)  04/12/19 81.1 kg (178 lb 12.8 oz)    General: NAD Neck: No JVD, no thyromegaly or thyroid nodule.  Lungs: Clear to auscultation bilaterally with normal respiratory effort. CV: Nondisplaced PMI.  Heart regular S1/S2, no S3/S4, no murmur.  No peripheral edema.  No carotid bruit.  Normal pedal pulses.  Abdomen: Soft, nontender, no hepatosplenomegaly, no distention.  Skin: Intact without lesions or rashes.  Neurologic: Alert and oriented x 3.  Psych: Normal affect. Extremities: No clubbing or cyanosis.  HEENT: Normal.   Assessment/Plan: 1. HTN:  Long standing.  She had carotid artery pattern that was consistent with FMD, but renal artery dopplers in 10/19 were not suggestive of renal artery stenosis.  Repeat workup for pheochromocytoma was negative. BP is controlled today.  - Continue Coreg 25 mg bid .  -  Continue Entresto 97/103 bid.  - Continue spironolactone 25 daily.   2. Chronic systolic CHF: Nonischemic cardiomyopathy (no significant CAD on 9/18 cath).  EF 35-40% by 2/18 echo and 44% by 7/18 cMRI. No late gadolinium enhancement on MRI. Etiology uncertain.  It is possible that she could have a cardiomyopathy due to long-standing severe MR (was present on initial echo in 4/16), but evaluation has not appeared to show structural problems with the MV so MR may be functional and and related to the cardiomyopathy. Possible hypertensive cardiomyopathy.  EF improved to 50% on 9/18 TEE, 55-60% on 7/19 echo, 55-60% on 10/20 echo.  NYHA class I symptoms.  She is not volume overloaded on exam.  - Continue current Entresto and spironolactone.  BMET today.  - Continue Coreg 25 mg bid.  - I think she can cut back on Lasix, decrease to 20 mg daily and KCl to 20 mEq daily.  3. Mitral regurgitation: Severe MR noted on imaging back to 2016.  TEE 9/18 with probably moderate MR in the setting of better BP control => moderate by PISA and vena contracta.  The MV appears structurally normal with no prolapse so suspect functional MR likely due to cardiomyopathy.  Most recent echo in 10/20 with moderate MR, I do not hear a murmur today.    4. Lacunar infarct in 7/19:  Most likely related to HTN.  5. Possible fibromuscular dysplasia: Appearance of carotids on CTA was highly suggesitive of FMD. CT chest/abdomen/pelvis in 10/19 for screening purposes did not show aneurysm or dissection. Renal artery dopplers were not suggestive of renal artery stenosis or FMD involvement.  - I will arrange for carotid dopplers to follow FMD involvement of carotid arteries.   BMET in 3 months, followup in 6 months.   Marca Ancona 09/12/2019

## 2019-09-12 NOTE — Patient Instructions (Addendum)
DECREASE Lasix to 20mg  (1/2 tab) daily  DECREASE Potassium to 20 meq (1 tab) daily  Labs today and repeat in 3 months on January 4th, 2021 at 10am.  Please call office for gate code.  We will only contact you if something comes back abnormal or we need to make some changes. Otherwise no news is good news!   You have been ordered for Carotid Dopplers at [facility]. They will call you to schedule this appointment.   Your physician recommends that you schedule a follow-up appointment in: 6 months with Dr Aundra Dubin. You will get a call to schedule this appointment in December.  Please call our office if you do not receive a call.   At the [facility], you and your health needs are our priority. As part of our continuing mission to provide you with exceptional heart care, we have created designated Provider Care Teams. These Care Teams include your primary Cardiologist (physician) and Advanced Practice Providers (APPs- Physician Assistants and Nurse Practitioners) who all work together to provide you with the care you need, when you need it.   You may see any of the following providers on your designated Care Team at your next follow up: Marland Kitchen Dr Glori Bickers . Dr Loralie Champagne . Darrick Grinder, NP   Please be sure to bring in all your medications bottles to every appointment.

## 2019-09-12 NOTE — Progress Notes (Signed)
  Echocardiogram 2D Echocardiogram has been performed.  Catherine Gross 09/12/2019, 9:51 AM

## 2019-09-14 ENCOUNTER — Telehealth (HOSPITAL_COMMUNITY): Payer: Self-pay

## 2019-09-14 DIAGNOSIS — I5022 Chronic systolic (congestive) heart failure: Secondary | ICD-10-CM

## 2019-09-14 DIAGNOSIS — E785 Hyperlipidemia, unspecified: Secondary | ICD-10-CM

## 2019-09-14 NOTE — Telephone Encounter (Signed)
-----   Message from Larey Dresser, MD sent at 09/13/2019 10:57 PM EDT ----- LDL is very high, please check with her, is she really taking atorvastatin 80 mg daily? If not, she needs to restart it with lipids/LFTs in 2 months.

## 2019-09-22 ENCOUNTER — Ambulatory Visit (HOSPITAL_COMMUNITY)
Admission: RE | Admit: 2019-09-22 | Discharge: 2019-09-22 | Disposition: A | Discharge status: Home / Self Care | Payer: BC Managed Care – PPO | Source: Ambulatory Visit | Attending: Cardiology | Admitting: Cardiology

## 2019-09-22 ENCOUNTER — Other Ambulatory Visit: Payer: Self-pay

## 2019-09-22 ENCOUNTER — Other Ambulatory Visit (HOSPITAL_COMMUNITY): Payer: Self-pay | Admitting: Cardiology

## 2019-09-22 DIAGNOSIS — I773 Arterial fibromuscular dysplasia: Secondary | ICD-10-CM

## 2019-09-29 DIAGNOSIS — Z20828 Contact with and (suspected) exposure to other viral communicable diseases: Secondary | ICD-10-CM | POA: Diagnosis not present

## 2019-10-04 NOTE — Progress Notes (Signed)
Established Patient Office Visit  Subjective:  Patient ID: Catherine Gross, female    DOB: February 17, 1971  Age: 48 y.o. MRN: [id number]  CC:  Chief Complaint  Patient presents with  . Gynecologic Exam    HPI Catherine Gross presents for well woman visit.  Past Medical History:  Diagnosis Date  . Benign essential HTN   . Cardiomyopathy (HCC)   . Mitral regurgitation   . Preeclampsia    first pregnancy, not with susequent pregnancy  . Stroke [facility])     Past Surgical History:  Procedure Laterality Date  . CESAREAN SECTION    . RIGHT/LEFT HEART CATH AND CORONARY ANGIOGRAPHY N/A 09/02/2017   Procedure: RIGHT/LEFT HEART CATH AND CORONARY ANGIOGRAPHY;  Surgeon: Laurey Morale, MD;  Location: [facility] INVASIVE CV LAB;  Service: Cardiovascular;  Laterality: N/A;  . TEE WITHOUT CARDIOVERSION N/A 09/02/2017   Procedure: TRANSESOPHAGEAL ECHOCARDIOGRAM (TEE);  Surgeon: Laurey Morale, MD;  Location: [facility] ENDOSCOPY;  Service: Cardiovascular;  Laterality: N/A;    Family History  Problem Relation Age of Onset  . Heart failure Father   . Hypertension Father   . Congestive Heart Failure Father   . Alzheimer's disease Mother   . Asthma Brother     Social History   Socioeconomic History  . Marital status: Single    Spouse name: Not on file  . Number of children: Not on file  . Years of education: Not on file  . Highest education level: Not on file  Occupational History  . Not on file  Social Needs  . Financial resource strain: Not on file  . Food insecurity    Worry: Not on file    Inability: Not on file  . Transportation needs    Medical: Not on file    Non-medical: Not on file  Tobacco Use  . Smoking status: Former Smoker    Packs/day: 0.50    Years: 20.00    Pack years: 10.00    Quit date: 03/22/2015    Years since quitting: 4.5  . Smokeless tobacco: Never Used  Substance and Sexual Activity  . Alcohol use: No  . Drug use: No  . Sexual activity: Not on file   Lifestyle  . Physical activity    Days per week: Not on file    Minutes per session: Not on file  . Stress: Not on file  Relationships  . Social Musician on phone: Not on file    Gets together: Not on file    Attends religious service: Not on file    Active member of club or organization: Not on file    Attends meetings of clubs or organizations: Not on file    Relationship status: Not on file  . Intimate partner violence    Fear of current or ex partner: Not on file    Emotionally abused: Not on file    Physically abused: Not on file    Forced sexual activity: Not on file  Other Topics Concern  . Not on file  Social History Narrative  . Not on file    Outpatient Medications Prior to Visit  Medication Sig Dispense Refill  . aspirin EC 81 MG EC tablet Take 1 tablet (81 mg total) by mouth daily. 30 tablet 0  . atorvastatin (LIPITOR) 80 MG tablet Take 1 tablet (80 mg total) by mouth daily at 6 PM. 90 tablet 3  . ferrous sulfate (FERROUSUL) 325 (65 FE) MG tablet Take  1 tablet (325 mg total) by mouth daily with breakfast. 100 tablet 1  . spironolactone (ALDACTONE) 25 MG tablet Take 1 tablet (25 mg total) by mouth daily. 90 tablet 3  . carvedilol (COREG) 25 MG tablet Take 1 tablet (25 mg total) by mouth 2 (two) times daily with a meal. 180 tablet 3  . furosemide (LASIX) 40 MG tablet Take 1 tablet (40 mg total) by mouth daily. 90 tablet 1  . potassium chloride SA (K-DUR,KLOR-CON) 20 MEQ tablet Take 2 tablets (40 mEq total) by mouth daily. (Patient taking differently: Take 60 mEq by mouth daily. ) 60 tablet 5  . sacubitril-valsartan (ENTRESTO) 97-103 MG Take 1 tablet by mouth 2 (two) times daily. 60 tablet 5   No facility-administered medications prior to visit.     No Known Allergies  ROS Review of Systems  Genitourinary: Positive for vaginal discharge.  All other systems reviewed and are negative.     Objective:    Physical Exam CONSTITUTIONAL: Well-developed,  well-nourished female in no acute distress.  HENT:  Normocephalic, atraumatic, External right and left ear normal. Oropharynx is clear and moist EYES: Conjunctivae and EOM are normal. Pupils are equal, round, and reactive to light. No scleral icterus.  NECK: Normal range of motion, supple, no masses.  Normal thyroid.  SKIN: Skin is warm and dry. No rash noted. Not diaphoretic. No erythema. No pallor. [place]: Alert and oriented to person, place, and time. Normal reflexes, muscle tone coordination. No cranial nerve deficit noted. PSYCHIATRIC: Normal mood and affect. Normal behavior. Normal judgment and thought content. CARDIOVASCULAR: Normal heart rate noted, regular rhythm RESPIRATORY: Clear to auscultation bilaterally. Effort and breath sounds normal, no problems with respiration noted. BREASTS: Symmetric in size. No masses, skin changes, nipple drainage, or lymphadenopathy. ABDOMEN: Soft, normal bowel sounds, no distention noted.  No tenderness, rebound or guarding.  PELVIC: Normal appearing external genitalia; normal appearing vaginal mucosa and cervix.  No abnormal discharge noted.  Pap smear obtained.  Normal uterine size, no other palpable masses, no uterine or adnexal tenderness. MUSCULOSKELETAL: Normal range of motion. No tenderness.  No cyanosis, clubbing, or edema.  2+ distal pulses.  BP 138/89   Pulse 71   Temp 98.3 F (36.8 C) (Oral)   Resp 16   Ht 5\' 5"  (1.651 m)   Wt 184 lb 3.2 oz (83.6 kg)   SpO2 98%   BMI 30.65 kg/m  Wt Readings from Last 3 Encounters:  09/12/19 166 lb 3.2 oz (75.4 kg)  04/26/19 174 lb (78.9 kg)  04/12/19 178 lb 12.8 oz (81.1 kg)     There are no preventive care reminders to display for this patient.  There are no preventive care reminders to display for this patient.  Lab Results  Component Value Date   TSH 0.41 01/20/2017   Lab Results  Component Value Date   WBC 5.9 04/12/2019   HGB 12.7 04/12/2019   HCT 38.9 04/12/2019   MCV 87  04/12/2019   PLT 282 04/12/2019   Lab Results  Component Value Date   NA 141 09/12/2019   K 4.5 09/12/2019   CO2 24 09/12/2019   GLUCOSE 100 (H) 09/12/2019   BUN 5 (L) 09/12/2019   CREATININE 0.65 09/12/2019   BILITOT 0.3 04/12/2019   ALKPHOS 67 04/12/2019   AST 13 04/12/2019   ALT 11 04/12/2019   PROT 7.9 04/12/2019   ALBUMIN 4.4 04/12/2019   CALCIUM 9.4 09/12/2019   ANIONGAP 9 09/12/2019   GFR 97.95 06/06/2015  Lab Results  Component Value Date   CHOL 230 (H) 09/12/2019   Lab Results  Component Value Date   HDL 41 09/12/2019   Lab Results  Component Value Date   LDLCALC 175 (H) 09/12/2019   Lab Results  Component Value Date   TRIG 69 09/12/2019   Lab Results  Component Value Date   CHOLHDL 5.6 09/12/2019   Lab Results  Component Value Date   HGBA1C 5.8 (H) 06/23/2018      Assessment & Plan:   Problem List Items Addressed This Visit    None    Visit Diagnoses    Pap smear for cervical cancer screening    -  Primary   Relevant Orders   Cytology - PAP (Completed)   Chronic systolic congestive heart failure (HCC)       Breast cancer screening       Relevant Orders   MM Digital Screening (Completed)      Meds ordered this encounter  Medications  . DISCONTD: furosemide (LASIX) 40 MG tablet    Sig: Take 1 tablet (40 mg total) by mouth daily.    Dispense:  90 tablet    Refill:  1    Follow-up: Return in about 3 months (around 04/11/2019).    Grayce Sessions, NP

## 2019-10-11 IMAGING — MG DIGITAL SCREENING BILATERAL MAMMOGRAM WITH CAD
4 series · 4 of 4 positions shown · non-contrast
Comparison: None.

CLINICAL DATA: Screening.

EXAM:
DIGITAL SCREENING BILATERAL MAMMOGRAM WITH CAD

[L MLO]
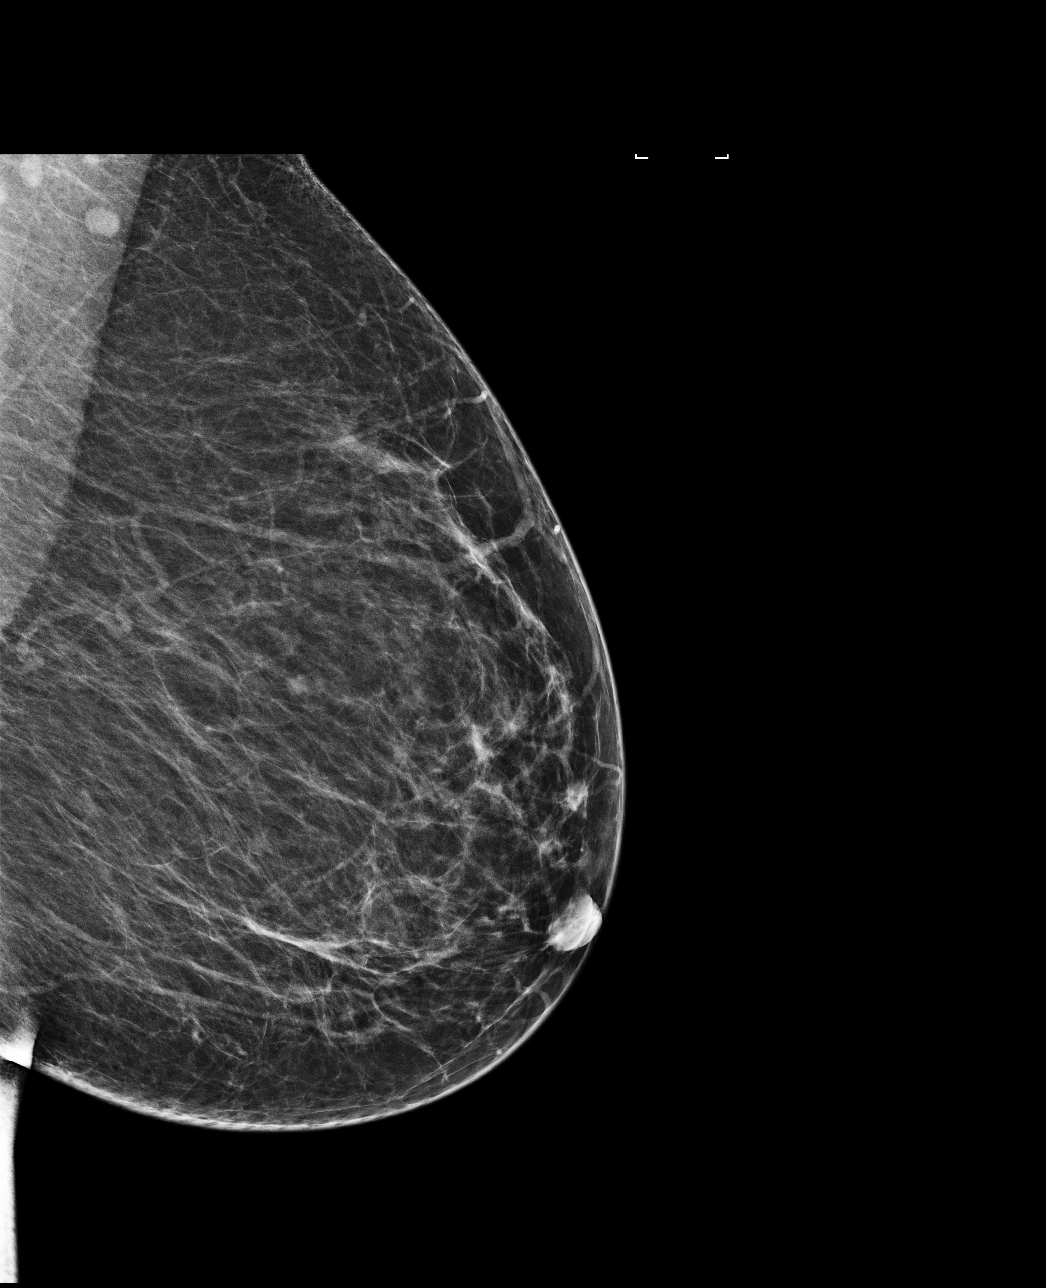

[R MLO]
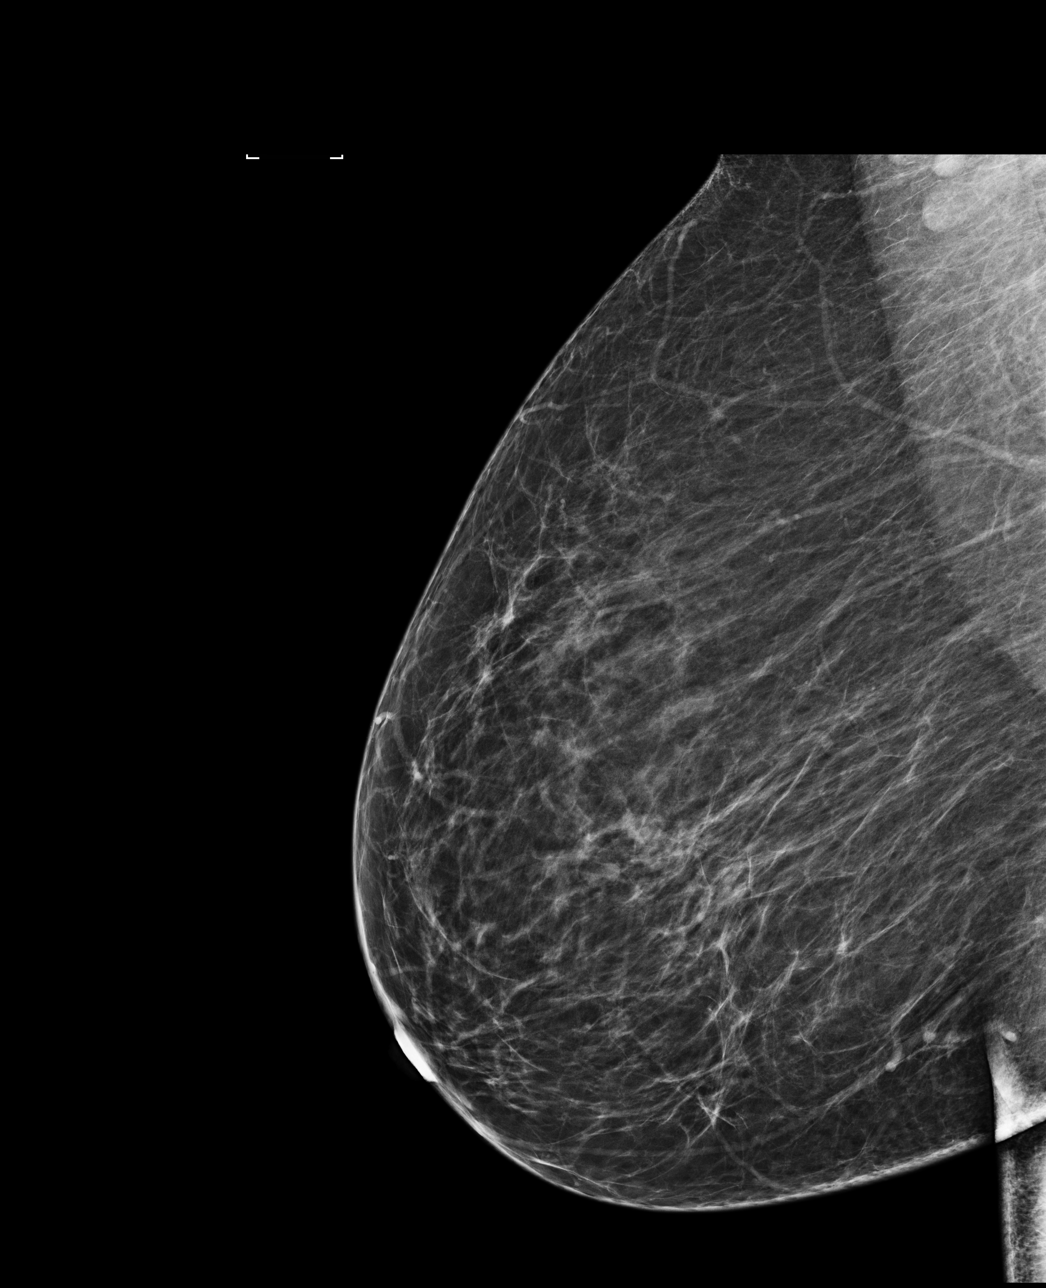

[L CC]
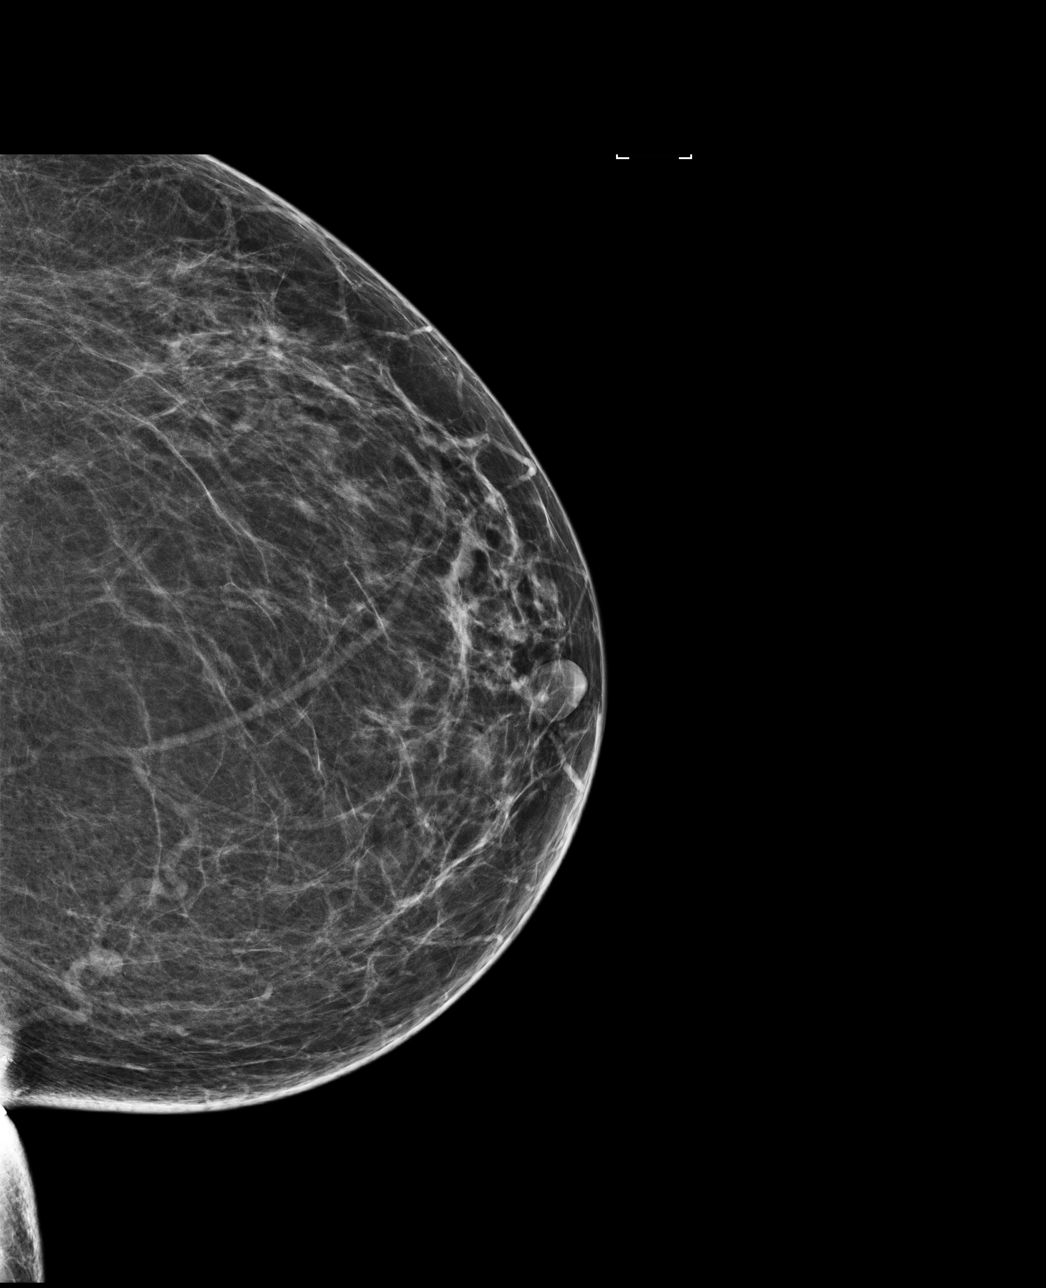

[R CC]
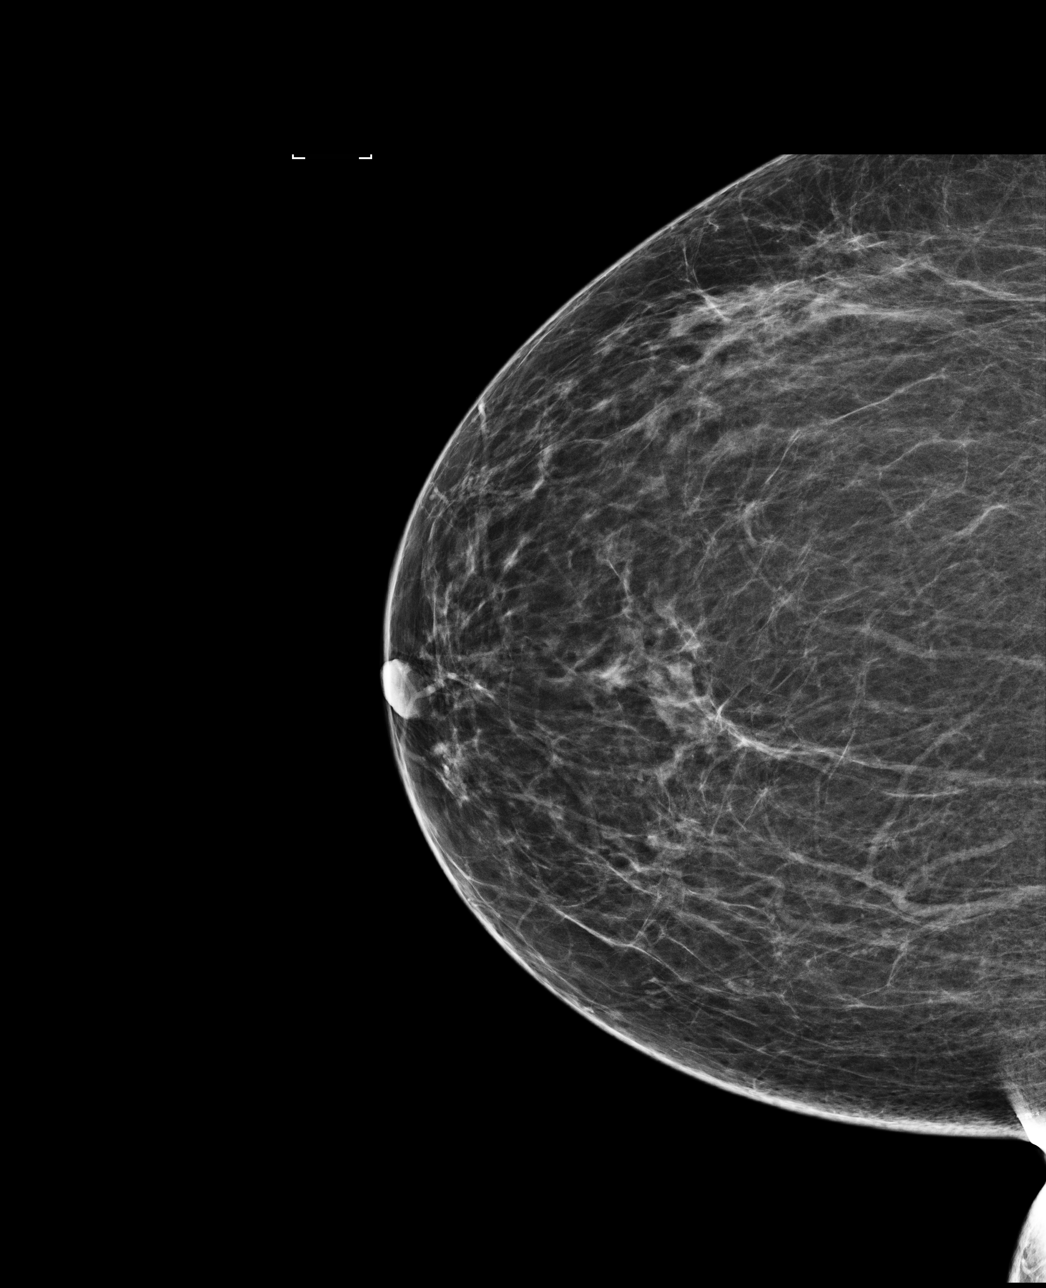

[4 of 4 positions shown; findings below may reference images not displayed]

ACR Breast Density Category b: There are scattered areas of
fibroglandular density.
FINDINGS: In the left breast, a possible mass warrants further evaluation. In
the right breast, no findings suspicious for malignancy.

Images were processed with CAD.
IMPRESSION: Further evaluation is suggested for possible mass in the left
breast.

RECOMMENDATION:
Diagnostic mammogram and possibly ultrasound of the left breast.
(Code:HO-5-00R)

The patient will be contacted regarding the findings, and additional
imaging will be scheduled.

BI-RADS CATEGORY  0: Incomplete. Need additional imaging evaluation
and/or prior mammograms for comparison.

## 2019-11-14 ENCOUNTER — Other Ambulatory Visit: Payer: Self-pay

## 2019-11-14 ENCOUNTER — Ambulatory Visit (HOSPITAL_COMMUNITY)
Admission: RE | Admit: 2019-11-14 | Discharge: 2019-11-14 | Disposition: A | Discharge status: Home / Self Care | Payer: BC Managed Care – PPO | Source: Ambulatory Visit | Attending: Internal Medicine | Admitting: Internal Medicine

## 2019-11-14 DIAGNOSIS — E785 Hyperlipidemia, unspecified: Secondary | ICD-10-CM | POA: Diagnosis not present

## 2019-11-14 DIAGNOSIS — I5022 Chronic systolic (congestive) heart failure: Secondary | ICD-10-CM | POA: Insufficient documentation

## 2019-11-14 LAB — LIPID PANEL
Cholesterol: 228 mg/dL — ABNORMAL HIGH (ref 0–200)
HDL: 45 mg/dL (ref >40)
LDL Cholesterol: 166 mg/dL — ABNORMAL HIGH (ref 0–99)
Total CHOL/HDL Ratio: 5.1 RATIO
Triglycerides: 87 mg/dL (ref <150)
VLDL: 17 mg/dL (ref 0–40)

## 2019-11-14 LAB — HEPATIC FUNCTION PANEL
ALT: 11 U/L (ref 0–44)
AST: 15 U/L (ref 15–41)
Albumin: 3.5 g/dL (ref 3.5–5.0)
Alkaline Phosphatase: 67 U/L (ref 38–126)
Bilirubin, Direct: 0.1 mg/dL (ref 0.0–0.2)
Indirect Bilirubin: 0.2 mg/dL — ABNORMAL LOW (ref 0.3–0.9)
Total Bilirubin: 0.3 mg/dL (ref 0.3–1.2)
Total Protein: 7.5 g/dL (ref 6.5–8.1)

## 2019-11-16 ENCOUNTER — Telehealth (HOSPITAL_COMMUNITY): Payer: Self-pay

## 2019-11-16 DIAGNOSIS — E7800 Pure hypercholesterolemia, unspecified: Secondary | ICD-10-CM

## 2019-11-16 DIAGNOSIS — I5022 Chronic systolic (congestive) heart failure: Secondary | ICD-10-CM

## 2019-11-16 NOTE — Telephone Encounter (Signed)
-----   Message from Larey Dresser, MD sent at 11/15/2019 10:10 PM EST ----- Supposedly she started taking atorvastatin after her 10/20 lipid panel but really no change in LDL, suspect she is not taking.  Needs to start atorvastatin 80 mg daily with lipids/LFTs in 2 months.

## 2019-11-16 NOTE — Telephone Encounter (Signed)
Pt aware of results. Pt not taking medication every day. She states she misses like 3 doses a week.  Encouraged to be consistent because of heart history. Pt verbalized understanding. She will make better efforts and rtc in 2 months for repeat lipid panel.

## 2019-12-12 ENCOUNTER — Ambulatory Visit (HOSPITAL_COMMUNITY)
Admission: RE | Admit: 2019-12-12 | Discharge: 2019-12-12 | Disposition: A | Discharge status: Home / Self Care | Payer: BC Managed Care – PPO | Source: Ambulatory Visit | Attending: Cardiology | Admitting: Cardiology

## 2019-12-12 ENCOUNTER — Other Ambulatory Visit: Payer: Self-pay

## 2019-12-12 DIAGNOSIS — I5022 Chronic systolic (congestive) heart failure: Secondary | ICD-10-CM | POA: Insufficient documentation

## 2019-12-12 LAB — BASIC METABOLIC PANEL
Anion gap: 10 (ref 5–15)
BUN: 8 mg/dL (ref 6–20)
CO2: 25 mmol/L (ref 22–32)
Calcium: 9.3 mg/dL (ref 8.9–10.3)
Chloride: 105 mmol/L (ref 98–111)
Creatinine, Ser: 0.52 mg/dL (ref 0.44–1.00)
GFR calc Af Amer: >60 mL/min (ref >60)
GFR calc non Af Amer: >60 mL/min (ref >60)
Glucose, Bld: 88 mg/dL (ref 70–99)
Potassium: 3.6 mmol/L (ref 3.5–5.1)
Sodium: 140 mmol/L (ref 135–145)

## 2020-01-17 ENCOUNTER — Other Ambulatory Visit (HOSPITAL_COMMUNITY): Payer: Self-pay

## 2020-01-17 ENCOUNTER — Other Ambulatory Visit: Payer: Self-pay

## 2020-01-17 ENCOUNTER — Ambulatory Visit (HOSPITAL_COMMUNITY)
Admission: RE | Admit: 2020-01-17 | Discharge: 2020-01-17 | Disposition: A | Discharge status: Home / Self Care | Payer: Medicaid Other | Source: Ambulatory Visit | Attending: Cardiology | Admitting: Cardiology

## 2020-01-17 DIAGNOSIS — E7800 Pure hypercholesterolemia, unspecified: Secondary | ICD-10-CM

## 2020-01-17 DIAGNOSIS — I420 Dilated cardiomyopathy: Secondary | ICD-10-CM

## 2020-01-17 DIAGNOSIS — I5022 Chronic systolic (congestive) heart failure: Secondary | ICD-10-CM

## 2020-01-17 LAB — HEPATIC FUNCTION PANEL
ALT: 11 U/L (ref 0–44)
AST: 14 U/L — ABNORMAL LOW (ref 15–41)
Albumin: 3.5 g/dL (ref 3.5–5.0)
Alkaline Phosphatase: 65 U/L (ref 38–126)
Bilirubin, Direct: <0.1 mg/dL (ref 0.0–0.2)
Total Bilirubin: 0.6 mg/dL (ref 0.3–1.2)
Total Protein: 7.2 g/dL (ref 6.5–8.1)

## 2020-01-17 LAB — LIPID PANEL
Cholesterol: 235 mg/dL — ABNORMAL HIGH (ref 0–200)
HDL: 46 mg/dL (ref >40)
LDL Cholesterol: 169 mg/dL — ABNORMAL HIGH (ref 0–99)
Total CHOL/HDL Ratio: 5.1 RATIO
Triglycerides: 100 mg/dL (ref <150)
VLDL: 20 mg/dL (ref 0–40)

## 2020-01-17 MED ORDER — CARVEDILOL 25 MG PO TABS: 25.0000 mg | ORAL_TABLET | Freq: Two times a day (BID) | ORAL | 3 refills | Status: DC

## 2020-01-17 MED ORDER — FUROSEMIDE 40 MG PO TABS: 20.0000 mg | ORAL_TABLET | Freq: Every day | ORAL | 2 refills | Status: DC

## 2020-01-23 ENCOUNTER — Telehealth (HOSPITAL_COMMUNITY): Payer: Self-pay

## 2020-01-23 DIAGNOSIS — E7800 Pure hypercholesterolemia, unspecified: Secondary | ICD-10-CM

## 2020-01-23 DIAGNOSIS — E785 Hyperlipidemia, unspecified: Secondary | ICD-10-CM

## 2020-01-23 NOTE — Telephone Encounter (Signed)
-----   Message from Laurey Morale, MD sent at 01/19/2020  8:32 PM EST ----- LDL is still extremely high.  Stop atorvastatin, start Crestor 40 mg daily.  Do not miss doses.  Repeat lipids/LFTs in 2 months.

## 2020-03-02 ENCOUNTER — Other Ambulatory Visit (HOSPITAL_COMMUNITY): Payer: Self-pay

## 2020-03-02 MED ORDER — ENTRESTO 97-103 MG PO TABS: 1.0000 | ORAL_TABLET | Freq: Two times a day (BID) | ORAL | 3 refills | Status: DC

## 2020-03-22 ENCOUNTER — Other Ambulatory Visit: Payer: Self-pay

## 2020-03-22 ENCOUNTER — Ambulatory Visit (HOSPITAL_COMMUNITY)
Admission: RE | Admit: 2020-03-22 | Discharge: 2020-03-22 | Disposition: A | Discharge status: Home / Self Care | Payer: Medicaid Other | Source: Ambulatory Visit | Attending: Cardiology | Admitting: Cardiology

## 2020-03-22 DIAGNOSIS — E785 Hyperlipidemia, unspecified: Secondary | ICD-10-CM | POA: Insufficient documentation

## 2020-03-22 LAB — LIPID PANEL
Cholesterol: 151 mg/dL (ref 0–200)
HDL: 44 mg/dL (ref >40)
LDL Cholesterol: 95 mg/dL (ref 0–99)
Total CHOL/HDL Ratio: 3.4 RATIO
Triglycerides: 60 mg/dL (ref <150)
VLDL: 12 mg/dL (ref 0–40)

## 2020-03-22 LAB — HEPATIC FUNCTION PANEL
ALT: 13 U/L (ref 0–44)
AST: 15 U/L (ref 15–41)
Albumin: 3.7 g/dL (ref 3.5–5.0)
Alkaline Phosphatase: 58 U/L (ref 38–126)
Bilirubin, Direct: <0.1 mg/dL (ref 0.0–0.2)
Total Bilirubin: 0.5 mg/dL (ref 0.3–1.2)
Total Protein: 7.2 g/dL (ref 6.5–8.1)

## 2020-05-04 ENCOUNTER — Other Ambulatory Visit (HOSPITAL_COMMUNITY): Payer: Self-pay | Admitting: *Deleted

## 2020-05-04 ENCOUNTER — Other Ambulatory Visit (HOSPITAL_COMMUNITY): Payer: Self-pay

## 2020-05-04 MED ORDER — ATORVASTATIN CALCIUM 80 MG PO TABS: 80.0000 mg | ORAL_TABLET | Freq: Every day | ORAL | 3 refills | Status: DC

## 2020-05-04 NOTE — Telephone Encounter (Signed)
Per Dr Shirlee Latch pt to continue with atorvastatin as patient started to take it consistently and responded well.

## 2020-09-21 ENCOUNTER — Ambulatory Visit (HOSPITAL_COMMUNITY): Payer: Medicaid Other

## 2020-10-03 ENCOUNTER — Other Ambulatory Visit: Payer: Self-pay

## 2020-10-03 ENCOUNTER — Other Ambulatory Visit (HOSPITAL_COMMUNITY): Payer: Self-pay | Admitting: Cardiology

## 2020-10-03 ENCOUNTER — Ambulatory Visit (HOSPITAL_COMMUNITY)
Admission: RE | Admit: 2020-10-03 | Discharge: 2020-10-03 | Disposition: A | Discharge status: Home / Self Care | Payer: BC Managed Care – PPO | Source: Ambulatory Visit | Attending: Cardiovascular Disease | Admitting: Cardiovascular Disease

## 2020-10-03 DIAGNOSIS — I773 Arterial fibromuscular dysplasia: Secondary | ICD-10-CM

## 2020-10-10 ENCOUNTER — Telehealth (HOSPITAL_COMMUNITY): Payer: Self-pay

## 2020-10-10 NOTE — Telephone Encounter (Signed)
Samara Snide, RN  10/10/2020 12:50 PM EDT Back to Top    Patient aware and verbalized understanding

## 2020-10-10 NOTE — Telephone Encounter (Signed)
-----   Message from Laurey Morale, MD sent at 10/05/2020  1:00 PM EDT ----- Turbulent flow from fibromuscular dysplasia, no tight stenosis.

## 2020-10-19 ENCOUNTER — Other Ambulatory Visit (HOSPITAL_COMMUNITY): Payer: Self-pay | Admitting: Cardiology

## 2020-10-19 DIAGNOSIS — I10 Essential (primary) hypertension: Secondary | ICD-10-CM

## 2020-12-13 ENCOUNTER — Telehealth (HOSPITAL_COMMUNITY): Payer: Self-pay | Admitting: *Deleted

## 2020-12-13 NOTE — Telephone Encounter (Signed)
Pt called to report she had a + covid test today and has some head congestion. Advised on safe OTC meds for her to take and provided phone # to call to inquire about antibody infusion [phone])

## 2021-01-17 ENCOUNTER — Other Ambulatory Visit (HOSPITAL_COMMUNITY): Payer: Self-pay | Admitting: Cardiology

## 2021-01-17 DIAGNOSIS — I5022 Chronic systolic (congestive) heart failure: Secondary | ICD-10-CM

## 2021-03-14 ENCOUNTER — Other Ambulatory Visit (HOSPITAL_COMMUNITY): Payer: Self-pay | Admitting: Cardiology

## 2021-03-14 DIAGNOSIS — I420 Dilated cardiomyopathy: Secondary | ICD-10-CM

## 2021-03-14 DIAGNOSIS — I5022 Chronic systolic (congestive) heart failure: Secondary | ICD-10-CM

## 2021-03-15 ENCOUNTER — Encounter (HOSPITAL_COMMUNITY): Payer: Self-pay | Admitting: Cardiology

## 2021-03-18 ENCOUNTER — Other Ambulatory Visit (HOSPITAL_COMMUNITY): Payer: Self-pay | Admitting: Cardiology

## 2021-04-28 DIAGNOSIS — Z20822 Contact with and (suspected) exposure to covid-19: Secondary | ICD-10-CM | POA: Diagnosis not present

## 2021-05-16 ENCOUNTER — Other Ambulatory Visit: Payer: Self-pay

## 2021-05-16 ENCOUNTER — Encounter (HOSPITAL_COMMUNITY): Payer: Self-pay | Admitting: Cardiology

## 2021-05-16 ENCOUNTER — Ambulatory Visit (HOSPITAL_COMMUNITY)
Admission: RE | Admit: 2021-05-16 | Discharge: 2021-05-16 | Disposition: A | Discharge status: Home / Self Care | Payer: BC Managed Care – PPO | Source: Ambulatory Visit | Attending: Cardiology | Admitting: Cardiology

## 2021-05-16 VITALS — BP 120/80 | HR 81 | Wt 162.0 lb

## 2021-05-16 DIAGNOSIS — I429 Cardiomyopathy, unspecified: Secondary | ICD-10-CM | POA: Diagnosis not present

## 2021-05-16 DIAGNOSIS — I340 Nonrheumatic mitral (valve) insufficiency: Secondary | ICD-10-CM | POA: Insufficient documentation

## 2021-05-16 DIAGNOSIS — I428 Other cardiomyopathies: Secondary | ICD-10-CM | POA: Diagnosis not present

## 2021-05-16 DIAGNOSIS — Z79899 Other long term (current) drug therapy: Secondary | ICD-10-CM | POA: Insufficient documentation

## 2021-05-16 DIAGNOSIS — I110 Hypertensive heart disease with heart failure: Secondary | ICD-10-CM | POA: Diagnosis not present

## 2021-05-16 DIAGNOSIS — I5022 Chronic systolic (congestive) heart failure: Secondary | ICD-10-CM | POA: Diagnosis not present

## 2021-05-16 DIAGNOSIS — Z8249 Family history of ischemic heart disease and other diseases of the circulatory system: Secondary | ICD-10-CM | POA: Diagnosis not present

## 2021-05-16 DIAGNOSIS — Z8673 Personal history of transient ischemic attack (TIA), and cerebral infarction without residual deficits: Secondary | ICD-10-CM | POA: Insufficient documentation

## 2021-05-16 DIAGNOSIS — E785 Hyperlipidemia, unspecified: Secondary | ICD-10-CM | POA: Diagnosis not present

## 2021-05-16 DIAGNOSIS — Z7982 Long term (current) use of aspirin: Secondary | ICD-10-CM | POA: Diagnosis not present

## 2021-05-16 DIAGNOSIS — Z87891 Personal history of nicotine dependence: Secondary | ICD-10-CM | POA: Insufficient documentation

## 2021-05-16 LAB — BASIC METABOLIC PANEL
Anion gap: 8 (ref 5–15)
BUN: 11 mg/dL (ref 6–20)
CO2: 27 mmol/L (ref 22–32)
Calcium: 9.6 mg/dL (ref 8.9–10.3)
Chloride: 105 mmol/L (ref 98–111)
Creatinine, Ser: 0.79 mg/dL (ref 0.44–1.00)
GFR, Estimated: >60 mL/min (ref >60)
Glucose, Bld: 108 mg/dL — ABNORMAL HIGH (ref 70–99)
Potassium: 4.2 mmol/L (ref 3.5–5.1)
Sodium: 140 mmol/L (ref 135–145)

## 2021-05-16 LAB — LIPID PANEL
Cholesterol: 222 mg/dL — ABNORMAL HIGH (ref 0–200)
HDL: 62 mg/dL (ref >40)
LDL Cholesterol: 145 mg/dL — ABNORMAL HIGH (ref 0–99)
Total CHOL/HDL Ratio: 3.6 RATIO
Triglycerides: 74 mg/dL (ref <150)
VLDL: 15 mg/dL (ref 0–40)

## 2021-05-16 MED ORDER — SPIRONOLACTONE 25 MG PO TABS: 12.5000 mg | ORAL_TABLET | Freq: Every day | ORAL | 3 refills | Status: DC

## 2021-05-16 NOTE — Patient Instructions (Signed)
Restart Spironolactone at 12.5 mg (1/2 tab) Daily  Labs done today, your results will be available in MyChart, we will contact you for abnormal readings.  Your physician recommends that you return for lab work in: 10 days and again in 3 months  Your physician has requested that you have a carotid duplex. This test is an ultrasound of the carotid arteries in your neck. It looks at blood flow through these arteries that supply the brain with blood. Allow one hour for this exam. There are no restrictions or special instructions. DUE IN October, they will call you to schedule this  Please call our office in November to schedule your echocardiogram and follow-up appointment  If you have any questions or concerns before your next appointment please send [place] a message through [facility] or call our office at [phone].    TO LEAVE A MESSAGE FOR THE NURSE SELECT OPTION 2, PLEASE LEAVE A MESSAGE INCLUDING: YOUR NAME DATE OF BIRTH CALL BACK NUMBER REASON FOR CALL**this is important as we prioritize the call backs  YOU WILL RECEIVE A CALL BACK THE SAME DAY AS LONG AS YOU CALL BEFORE 4:00 PM  At the [facility], you and your health needs are our priority. As part of our continuing mission to provide you with exceptional heart care, we have created designated Provider Care Teams. These Care Teams include your primary Cardiologist (physician) and Advanced Practice Providers (APPs- Physician Assistants and Nurse Practitioners) who all work together to provide you with the care you need, when you need it.   You may see any of the following providers on your designated Care Team at your next follow up: Dr Arvilla Meres Dr Marca Ancona Dr Brandon Melnick, NP Robbie Lis, [place] Mikki Santee Karle Plumber, PharmD   Please be sure to bring in all your medications bottles to every appointment.

## 2021-05-17 ENCOUNTER — Other Ambulatory Visit (HOSPITAL_COMMUNITY): Payer: Self-pay | Admitting: Surgery

## 2021-05-17 DIAGNOSIS — E7800 Pure hypercholesterolemia, unspecified: Secondary | ICD-10-CM

## 2021-05-17 NOTE — Progress Notes (Signed)
Order entered for labwork.

## 2021-05-18 NOTE — Progress Notes (Signed)
Advanced Heart Failure Clinic Note   PCP: Marcine Matar, MD HF Cardiology: Dr. Shirlee Latch  50 y.o. with history of cardiomyopathy of uncertain etiology, HTN, and severe mitral regurgitation returns for followup of CHF and MR.  She has had a history of HTN for about 5 years or so.  She had pre-eclampsia with a pregnancy.  In 2016, she had an echo showing EF 35% with diffuse hypokinesis and severe MR.  Workup for secondary hypertension at that time showed elevated urinary metanephrines.  There was concern for pheochromocytoma, but it appears that this was not followed up at the time.  She had a repeat echo in 2/18 with EF 35-40% and again, severe MR.  Cardiac MRI in 7/18 showed EF 44%, severe MR, and no myocardial late gadolinium enhancement.  She had repeat urinary metanephrines => this actually was normal (8/18), serum metanephrines in 9/18 were also normal.   She had right and left heart cath in 9/18, showing no significant CAD and filling pressures were optimized.  TEE in 9/18 showed improvement in EF to 50% with moderate MR by PISA and vena contracta, moderate-severe visually.  Echo in 7/19 showed EF 55-60%, mild to moderate AI, moderate MR.   In 7/19, she was admitted with acute lacunar infarct.  CTA neck showed pattern in the carotid arteries that was concerning for fibromuscular dysplasia. Renal artery dopplers in 10/19 did not show evidence for renal artery stenosis.   Echo in 10/20 showed EF 50-55%, mild LVH, normal RV size and systolic function, moderate MR, mild AI.   She presents today for followup of CHF and HTN.  BP is controlled.  Weight is down 4 lbs. She is working 2 jobs.  No significant exertional dyspnea.  No chest pain.  No lightheadedness.    Labs (4/16): Elevated urinary normetanephrine and total metanephrines.  Labs (5/16): HIV negative Labs (4/18): K 3.7, creatinine 0.76, LDL 194, TSH normal Labs (4/09): Normal urinary metanephrines Labs (9/18): serum metanephrines  normal, hgb 11.1, K 3.6, creatinine 0.74 Labs (7/19): LDL 178 Labs (8/19): creatinine 0.94 Labs (12/19): K 3.6, creatinine 0.75 Labs (5/20): K 5, creatinine 0.94 Labs (1/21): K 3.6, creatinine 0.58 Labs (4/21): LDL 95  PMH: 1. HTN: Evaluation in 2016 was concerning for pheochromocytoma with elevated urinary metanephrines but no followup.  8/18 24 hour urine collection actually showed normal urinary metanephrines.  - Renal artery dopplers in 10/19 with no evidence for renal artery stenosis.  2. Hyperlipidemia 3. Chronic systolic CHF: Nonischemic cardiomyopathy - Echo (4/16): EF 35%, mild LV dilation, mild LVH, mild AI, PASP 49 mmHg, severe MR.  - Echo (2/18): EF 35-40%, moderate LV dilation, diffuse hypokinesis, moderate AI, severe MR, normal RV size and systolic function, moderate TR, moderate PI.  - Cardiac MRI (7/18): EF 44% with mild LV dilation, mild LVH, severe central MR, no late gadolinium enhancement noted.  - LHC/RHC (9/18): No significant CAD; mean RA 6, PA 28/12, mean PCWP 10, 3+ MR.  - TEE (9/18): TEE 50%, mild LV dilation, visually moderate to severe MR but moderate by PISA (ERO 0.34) and vena contracta (0.55 cm), normal RV size and systolic function, peak RV-RA gradient 46 mmHg.  - Echo (7/19): EF 55-60%, mild LV dilation, no AS, mild-moderate AI, moderate MR, normal RV, severe LAE.  - Echo (10/20):  EF 50-55%, mild LVH, normal RV size and systolic function, moderate MR, mild AI. 4. Mitral regurgitation: Severe by echo and cardiac MRI, appears to be central.  - TEE  9/18 with probably moderate MR (by PISA and vena contracta). MR central, likely functional. - Echo (7/19): Moderate MR.   - Echo (10/29): Moderate MR.  5. Lacunar CVA: 7/19.  6. Fibromuscular dysplasia: CTA head/neck 7/19 showed pattern in the carotid arteries concerning for FMD.  - Renal artery dopplers (10/19): no evidence for renal artery stenosis or mesenteric artery stenosis.  - Carotid dopplers (10/21) with  mild turbulent flow from fibromuscular dysplasia without significant stenosis.   Social History   Socioeconomic History   Marital status: Single    Spouse name: Not on file   Number of children: Not on file   Years of education: Not on file   Highest education level: Not on file  Occupational History   Not on file  Tobacco Use   Smoking status: Former    Packs/day: 0.50    Years: 20.00    Pack years: 10.00    Types: Cigarettes    Quit date: 03/22/2015    Years since quitting: 6.1   Smokeless tobacco: Never  Vaping Use   Vaping Use: Never used  Substance and Sexual Activity   Alcohol use: No   Drug use: No   Sexual activity: Not on file  Other Topics Concern   Not on file  Social History Narrative   Not on file   Social Determinants of Health   Financial Resource Strain: Not on file  Food Insecurity: Not on file  Transportation Needs: Not on file  Physical Activity: Not on file  Stress: Not on file  Social Connections: Not on file  Intimate Partner Violence: Not on file   Family History  Problem Relation Age of Onset   Heart failure Father    Hypertension Father    Congestive Heart Failure Father    Alzheimer's disease Mother    Asthma Brother    Review of systems complete and found to be negative unless listed in HPI.    Current Outpatient Medications  Medication Sig Dispense Refill   aspirin EC 81 MG EC tablet Take 1 tablet (81 mg total) by mouth daily. 30 tablet 0   atorvastatin (LIPITOR) 80 MG tablet Take 1 tablet (80 mg total) by mouth daily at 6 PM. 90 tablet 3   Blood Pressure Monitor DEVI Use as directed to check home blood pressure 2-3 times a week 1 Device 0   carvedilol (COREG) 25 MG tablet Take 1 tablet (25 mg total) by mouth 2 (two) times daily with a meal. Please call for office visit [phone] 60 tablet 0   ENTRESTO 97-103 MG TAKE 1 TABLET BY MOUTH TWICE DAILY 180 tablet 1   ferrous sulfate (FERROUSUL) 325 (65 FE) MG tablet Take 1 tablet (325  mg total) by mouth daily with breakfast. 100 tablet 1   furosemide (LASIX) 40 MG tablet Take 0.5 tablets (20 mg total) by mouth daily. Please call for office visit [phone] 15 tablet 0   potassium chloride SA (KLOR-CON) 20 MEQ tablet Take 1 tablet (20 mEq total) by mouth daily. Needs appt for future refills 30 tablet 3   spironolactone (ALDACTONE) 25 MG tablet Take 0.5 tablets (12.5 mg total) by mouth daily. 15 tablet 3   No current facility-administered medications for this encounter.   Vitals:   05/16/21 1132  BP: 120/80  Pulse: 81  SpO2: 98%  Weight: 73.5 kg (162 lb)   Wt Readings from Last 3 Encounters:  05/16/21 73.5 kg (162 lb)  09/12/19 75.4 kg (166 lb 3.2 oz)  04/26/19 78.9 kg (174 lb)    General: NAD Neck: No JVD, no thyromegaly or thyroid nodule.  Lungs: Clear to auscultation bilaterally with normal respiratory effort. CV: Nondisplaced PMI.  Heart regular S1/S2, no S3/S4, no murmur.  No peripheral edema.  No carotid bruit.  Normal pedal pulses.  Abdomen: Soft, nontender, no hepatosplenomegaly, no distention.  Skin: Intact without lesions or rashes.  Neurologic: Alert and oriented x 3.  Psych: Normal affect. Extremities: No clubbing or cyanosis.  HEENT: Normal.   Assessment/Plan: 1. HTN:  Long standing.  She had carotid artery pattern that was consistent with FMD, but renal artery dopplers in 10/19 were not suggestive of renal artery stenosis.  Repeat workup for pheochromocytoma was negative. BP is controlled today.  - Continue Coreg 25 mg bid .  - Continue Entresto 97/103 bid.  2. Chronic systolic CHF: Nonischemic cardiomyopathy (no significant CAD on 9/18 cath).  EF 35-40% by 2/18 echo and 44% by 7/18 cMRI. No late gadolinium enhancement on MRI. Etiology uncertain.  It is possible that she could have a cardiomyopathy due to long-standing severe MR (was present on initial echo in 4/16), but evaluation has not appeared to show structural problems with the MV so MR may  be functional and and related to the cardiomyopathy. Possible hypertensive cardiomyopathy.  EF improved to 50% on 9/18 TEE, 55-60% on 7/19 echo, 50-55% on 10/20 echo.  NYHA class I symptoms.  She is not volume overloaded on exam.  - Continue current Entresto and Coreg.  - Restart spironolactone 12.5 mg daily. BMET today and in 10 days.  - Continue Lasix 20 mg daily.  3. Mitral regurgitation: Severe MR noted on imaging back to 2016.  TEE 9/18 with probably moderate MR in the setting of better BP control => moderate by PISA and vena contracta.  The MV appeared structurally normal with no prolapse so suspect functional MR likely due to cardiomyopathy.  Most recent echo in 10/20 with moderate MR, I do not hear a murmur today.    - I will arrange for repeat echo at followup.  4. Lacunar infarct in 7/19:  Most likely related to HTN.  5. Possible fibromuscular dysplasia: Appearance of carotids on CTA was highly suggesitive of FMD. CT chest/abdomen/pelvis in 10/19 for screening purposes did not show aneurysm or dissection. Renal artery dopplers were not suggestive of renal artery stenosis or FMD involvement.  Carotid dopplers in 10/21 showed mild turbulent flow from FMD.  - Repeat carotid dopplers in 10/22.   Followup 6 months with echo.   Marca Ancona 05/18/2021

## 2021-05-27 ENCOUNTER — Other Ambulatory Visit (HOSPITAL_COMMUNITY): Payer: Self-pay | Admitting: *Deleted

## 2021-05-27 ENCOUNTER — Other Ambulatory Visit: Payer: Self-pay

## 2021-05-27 ENCOUNTER — Other Ambulatory Visit (HOSPITAL_COMMUNITY): Payer: Self-pay | Admitting: Unknown Physician Specialty

## 2021-05-27 ENCOUNTER — Ambulatory Visit (HOSPITAL_COMMUNITY)
Admission: RE | Admit: 2021-05-27 | Discharge: 2021-05-27 | Disposition: A | Discharge status: Home / Self Care | Payer: BC Managed Care – PPO | Source: Ambulatory Visit | Attending: Internal Medicine | Admitting: Internal Medicine

## 2021-05-27 DIAGNOSIS — E7800 Pure hypercholesterolemia, unspecified: Secondary | ICD-10-CM | POA: Insufficient documentation

## 2021-05-27 DIAGNOSIS — I5022 Chronic systolic (congestive) heart failure: Secondary | ICD-10-CM | POA: Insufficient documentation

## 2021-05-27 LAB — BASIC METABOLIC PANEL
Anion gap: 4 — ABNORMAL LOW (ref 5–15)
BUN: 9 mg/dL (ref 6–20)
CO2: 30 mmol/L (ref 22–32)
Calcium: 9.5 mg/dL (ref 8.9–10.3)
Chloride: 107 mmol/L (ref 98–111)
Creatinine, Ser: 0.79 mg/dL (ref 0.44–1.00)
GFR, Estimated: >60 mL/min (ref >60)
Glucose, Bld: 98 mg/dL (ref 70–99)
Potassium: 3.9 mmol/L (ref 3.5–5.1)
Sodium: 141 mmol/L (ref 135–145)

## 2021-05-27 MED ORDER — ATORVASTATIN CALCIUM 80 MG PO TABS: 80.0000 mg | ORAL_TABLET | Freq: Every day | ORAL | 3 refills | Status: DC

## 2021-05-29 ENCOUNTER — Other Ambulatory Visit (HOSPITAL_COMMUNITY): Payer: Self-pay | Admitting: *Deleted

## 2021-05-29 DIAGNOSIS — E7800 Pure hypercholesterolemia, unspecified: Secondary | ICD-10-CM

## 2021-05-29 DIAGNOSIS — E785 Hyperlipidemia, unspecified: Secondary | ICD-10-CM

## 2021-05-29 DIAGNOSIS — I5022 Chronic systolic (congestive) heart failure: Secondary | ICD-10-CM

## 2021-05-29 MED ORDER — ATORVASTATIN CALCIUM 80 MG PO TABS: 80.0000 mg | ORAL_TABLET | Freq: Every day | ORAL | 3 refills | Status: DC

## 2021-06-24 ENCOUNTER — Other Ambulatory Visit (HOSPITAL_COMMUNITY): Payer: Self-pay | Admitting: Cardiology

## 2021-06-24 DIAGNOSIS — I5022 Chronic systolic (congestive) heart failure: Secondary | ICD-10-CM

## 2021-06-26 ENCOUNTER — Other Ambulatory Visit (HOSPITAL_COMMUNITY): Payer: Self-pay

## 2021-08-13 ENCOUNTER — Other Ambulatory Visit (HOSPITAL_COMMUNITY): Payer: Self-pay | Admitting: Cardiology

## 2021-08-13 DIAGNOSIS — E785 Hyperlipidemia, unspecified: Secondary | ICD-10-CM

## 2021-08-13 DIAGNOSIS — E7800 Pure hypercholesterolemia, unspecified: Secondary | ICD-10-CM

## 2021-08-13 DIAGNOSIS — I420 Dilated cardiomyopathy: Secondary | ICD-10-CM

## 2021-08-13 DIAGNOSIS — I5022 Chronic systolic (congestive) heart failure: Secondary | ICD-10-CM

## 2021-08-16 ENCOUNTER — Other Ambulatory Visit (HOSPITAL_COMMUNITY): Payer: BC Managed Care – PPO

## 2021-08-23 ENCOUNTER — Ambulatory Visit (HOSPITAL_COMMUNITY)
Admission: RE | Admit: 2021-08-23 | Discharge: 2021-08-23 | Disposition: A | Discharge status: Home / Self Care | Payer: BC Managed Care – PPO | Source: Ambulatory Visit | Attending: Cardiology | Admitting: Cardiology

## 2021-08-23 ENCOUNTER — Other Ambulatory Visit: Payer: Self-pay

## 2021-08-23 DIAGNOSIS — I5022 Chronic systolic (congestive) heart failure: Secondary | ICD-10-CM | POA: Insufficient documentation

## 2021-08-23 LAB — BASIC METABOLIC PANEL
Anion gap: 7 (ref 5–15)
BUN: 10 mg/dL (ref 6–20)
CO2: 27 mmol/L (ref 22–32)
Calcium: 9.3 mg/dL (ref 8.9–10.3)
Chloride: 107 mmol/L (ref 98–111)
Creatinine, Ser: 0.85 mg/dL (ref 0.44–1.00)
GFR, Estimated: >60 mL/min (ref >60)
Glucose, Bld: 143 mg/dL — ABNORMAL HIGH (ref 70–99)
Potassium: 3.6 mmol/L (ref 3.5–5.1)
Sodium: 141 mmol/L (ref 135–145)

## 2021-08-28 ENCOUNTER — Other Ambulatory Visit (HOSPITAL_COMMUNITY): Payer: Self-pay

## 2021-08-28 DIAGNOSIS — I10 Essential (primary) hypertension: Secondary | ICD-10-CM

## 2021-08-28 MED ORDER — POTASSIUM CHLORIDE CRYS ER 20 MEQ PO TBCR: 20.0000 meq | EXTENDED_RELEASE_TABLET | Freq: Every day | ORAL | 4 refills | Status: DC

## 2021-10-08 ENCOUNTER — Ambulatory Visit (HOSPITAL_COMMUNITY): Payer: BC Managed Care – PPO | Attending: Cardiology

## 2021-10-08 ENCOUNTER — Other Ambulatory Visit: Payer: Self-pay

## 2021-10-08 DIAGNOSIS — I5022 Chronic systolic (congestive) heart failure: Secondary | ICD-10-CM | POA: Diagnosis not present

## 2021-10-08 LAB — ECHOCARDIOGRAM COMPLETE
Area-P 1/2: 3.28 cm2
MV M vel: 5.79 m/s
MV Peak grad: 134.1 mmHg
P 1/2 time: 521 msec
Radius: 0.8 cm
S' Lateral: 3.9 cm

## 2023-10-26 ENCOUNTER — Ambulatory Visit: Payer: BC Managed Care – PPO | Attending: Family Medicine | Admitting: Family Medicine

## 2023-10-26 ENCOUNTER — Encounter: Payer: Self-pay | Admitting: Family Medicine

## 2023-10-26 VITALS — BP 149/84 | HR 97 | Ht 65.0 in | Wt 176.8 lb

## 2023-10-26 DIAGNOSIS — E785 Hyperlipidemia, unspecified: Secondary | ICD-10-CM

## 2023-10-26 DIAGNOSIS — I420 Dilated cardiomyopathy: Secondary | ICD-10-CM | POA: Diagnosis not present

## 2023-10-26 DIAGNOSIS — I5022 Chronic systolic (congestive) heart failure: Secondary | ICD-10-CM

## 2023-10-26 DIAGNOSIS — Z1159 Encounter for screening for other viral diseases: Secondary | ICD-10-CM

## 2023-10-26 MED ORDER — SPIRONOLACTONE 25 MG PO TABS: 12.5000 mg | ORAL_TABLET | Freq: Every day | ORAL | 1 refills | Status: DC

## 2023-10-26 MED ORDER — CARVEDILOL 3.125 MG PO TABS: 3.1250 mg | ORAL_TABLET | Freq: Two times a day (BID) | ORAL | 1 refills | Status: DC

## 2023-10-26 MED ORDER — POTASSIUM CHLORIDE CRYS ER 20 MEQ PO TBCR: 20.0000 meq | EXTENDED_RELEASE_TABLET | Freq: Every day | ORAL | 4 refills | Status: DC

## 2023-10-26 MED ORDER — ATORVASTATIN CALCIUM 80 MG PO TABS: 80.0000 mg | ORAL_TABLET | Freq: Every day | ORAL | 1 refills | Status: DC

## 2023-10-26 MED ORDER — ENTRESTO 97-103 MG PO TABS: 1.0000 | ORAL_TABLET | Freq: Two times a day (BID) | ORAL | 1 refills | Status: DC

## 2023-10-26 MED ORDER — FUROSEMIDE 20 MG PO TABS: 20.0000 mg | ORAL_TABLET | Freq: Every day | ORAL | 1 refills | Status: DC

## 2023-10-26 NOTE — Progress Notes (Signed)
Subjective:  Patient ID: Catherine Gross, female    DOB: May 27, 1971  Age: 52 y.o. MRN: [id number]  CC: Medical Management of Chronic Issues (Medication refill)   HPI Catherine Gross is a 52 y.o. year old female patient of Dr. Laural Benes with a history of hypertension, severe mitral regurg, CHF, history of lacunar infarct (in 06/2018). Last seen in the clinic in 04/2019  Interval History: Discussed the use of AI scribe software for clinical note transcription with the patient, who gave verbal consent to proceed.  She presents for a medication refill. She reports running out of her medications since her last cardiology appointment in 2022. Despite discontinuing her medications, she denies any shortness of breath or ankle swelling or chest pain.  Her blood pressure is elevated today.  She denies presence of additional concerns.  She plans to schedule an appointment with her cardiologist this week.        Past Medical History:  Diagnosis Date   Benign essential HTN    Cardiomyopathy (HCC)    Mitral regurgitation    Preeclampsia    first pregnancy, not with susequent pregnancy   Stroke [facility])     Past Surgical History:  Procedure Laterality Date   CESAREAN SECTION     RIGHT/LEFT HEART CATH AND CORONARY ANGIOGRAPHY N/A 09/02/2017   Procedure: RIGHT/LEFT HEART CATH AND CORONARY ANGIOGRAPHY;  Surgeon: Laurey Morale, MD;  Location: [facility] [place] [facility] INVASIVE CV LAB;  Service: Cardiovascular;  Laterality: N/A;   TEE WITHOUT CARDIOVERSION N/A 09/02/2017   Procedure: TRANSESOPHAGEAL ECHOCARDIOGRAM (TEE);  Surgeon: Laurey Morale, MD;  Location: [facility] ENDOSCOPY;  Service: Cardiovascular;  Laterality: N/A;    Family History  Problem Relation Age of Onset   Heart failure Father    Hypertension Father    Congestive Heart Failure Father    Alzheimer's disease Mother    Asthma Brother     Social History   Socioeconomic History   Marital status: Single    Spouse name: Not on file   Number of  children: Not on file   Years of education: Not on file   Highest education level: Not on file  Occupational History   Not on file  Tobacco Use   Smoking status: Former    Current packs/day: 0.00    Average packs/day: 0.5 packs/day for 20.0 years (10.0 ttl pk-yrs)    Types: Cigarettes    Start date: 03/22/1995    Quit date: 03/22/2015    Years since quitting: 8.6   Smokeless tobacco: Never  Vaping Use   Vaping status: Never Used  Substance and Sexual Activity   Alcohol use: No   Drug use: No   Sexual activity: Not on file  Other Topics Concern   Not on file  Social History Narrative   Not on file   Social Determinants of Health   Financial Resource Strain: Low Risk  (10/26/2023)   Overall Financial Resource Strain (CARDIA)    Difficulty of Paying Living Expenses: Not hard at all  Food Insecurity: No Food Insecurity (10/26/2023)   Hunger Vital Sign    Worried About Running Out of Food in the Last Year: Never true    Ran Out of Food in the Last Year: Never true  Transportation Needs: No Transportation Needs (10/26/2023)   PRAPARE - Administrator, Civil Service (Medical): No    Lack of Transportation (Non-Medical): No  Physical Activity: Patient Declined (10/26/2023)   Exercise Vital Sign    Days of  Exercise per Week: Patient declined    Minutes of Exercise per Session: Patient declined  Stress: No Stress Concern Present (10/26/2023)   [employer] of Occupational Health - Occupational Stress Questionnaire    Feeling of Stress : Not at all  Social Connections: Moderately Integrated (10/26/2023)   Social Connection and Isolation Panel [NHANES]    Frequency of Communication with Friends and Family: More than three times a week    Frequency of Social Gatherings with Friends and Family: Once a week    Attends Religious Services: 1 to 4 times per year    Active Member of [employer] or Organizations: Yes    Attends Banker Meetings: 1 to 4 times per  year    Marital Status: Never married    No Known Allergies  Outpatient Medications Prior to Visit  Medication Sig Dispense Refill   aspirin EC 81 MG EC tablet Take 1 tablet (81 mg total) by mouth daily. 30 tablet 0   Blood Pressure Monitor DEVI Use as directed to check home blood pressure 2-3 times a week 1 Device 0   ferrous sulfate (FERROUSUL) 325 (65 FE) MG tablet Take 1 tablet (325 mg total) by mouth daily with breakfast. 100 tablet 1   atorvastatin (LIPITOR) 80 MG tablet TAKE 1 TABLET(80 MG) BY MOUTH DAILY AT 6 PM 90 tablet 3   carvedilol (COREG) 25 MG tablet TAKE 1 TABLET(25 MG) BY MOUTH TWICE DAILY WITH A MEAL 180 tablet 3   ENTRESTO 97-103 MG TAKE 1 TABLET BY MOUTH TWICE DAILY 180 tablet 1   furosemide (LASIX) 40 MG tablet TAKE 1/2 TABLET(20 MG) BY MOUTH DAILY 15 tablet 11   potassium chloride SA (KLOR-CON) 20 MEQ tablet Take 1 tablet (20 mEq total) by mouth daily. Needs appt for future refills 30 tablet 4   spironolactone (ALDACTONE) 25 MG tablet Take 0.5 tablets (12.5 mg total) by mouth daily. 15 tablet 3   No facility-administered medications prior to visit.     ROS Review of Systems  Constitutional:  Negative for activity change and appetite change.  HENT:  Negative for sinus pressure and sore throat.   Respiratory:  Negative for chest tightness, shortness of breath and wheezing.   Cardiovascular:  Negative for chest pain and palpitations.  Gastrointestinal:  Negative for abdominal distention, abdominal pain and constipation.  Genitourinary: Negative.   Musculoskeletal: Negative.   Psychiatric/Behavioral:  Negative for behavioral problems and dysphoric mood.     Objective:  BP (!) 149/84   Pulse 97   Ht 5\' 5"  (1.651 m)   Wt 176 lb 12.8 oz (80.2 kg)   SpO2 100%   BMI 29.42 kg/m      10/26/2023    4:47 PM 10/26/2023    4:18 PM 05/16/2021   11:32 AM  BP/Weight  Systolic BP 149 157 120  Diastolic BP 84 92 80  Wt. (Lbs)  176.8 162  BMI  29.42 kg/m2 26.96  kg/m2      Physical Exam Constitutional:      Appearance: She is well-developed.  Neck:     Comments: No JVD Cardiovascular:     Rate and Rhythm: Normal rate.     Heart sounds: Normal heart sounds. No murmur heard. Pulmonary:     Effort: Pulmonary effort is normal.     Breath sounds: Normal breath sounds. No wheezing or rales.  Chest:     Chest wall: No tenderness.  Abdominal:     General: Bowel sounds are normal. There  is no distension.     Palpations: Abdomen is soft. There is no mass.     Tenderness: There is no abdominal tenderness.  Musculoskeletal:        General: Normal range of motion.     Right lower leg: No edema.     Left lower leg: No edema.  Neurological:     Mental Status: She is alert and oriented to person, place, and time.  Psychiatric:        Mood and Affect: Mood normal.        Latest Ref Rng & Units 08/23/2021    8:43 AM 05/27/2021    9:41 AM 05/16/2021   12:31 PM  CMP  Glucose 70 - 99 mg/dL 621  98  308   BUN 6 - 20 mg/dL 10  9  11    Creatinine 0.44 - 1.00 mg/dL 6.57  8.46  9.62   Sodium 135 - 145 mmol/L 141  141  140   Potassium 3.5 - 5.1 mmol/L 3.6  3.9  4.2   Chloride 98 - 111 mmol/L 107  107  105   CO2 22 - 32 mmol/L 27  30  27    Calcium 8.9 - 10.3 mg/dL 9.3  9.5  9.6     Lipid Panel     Component Value Date/Time   CHOL 222 (H) 05/16/2021 1231   CHOL 244 (H) 05/19/2018 1024   TRIG 74 05/16/2021 1231   HDL 62 05/16/2021 1231   HDL 40 05/19/2018 1024   CHOLHDL 3.6 05/16/2021 1231   VLDL 15 05/16/2021 1231   LDLCALC 145 (H) 05/16/2021 1231   LDLCALC 186 (H) 05/19/2018 1024    CBC    Component Value Date/Time   WBC 5.9 04/12/2019 1030   WBC 4.3 06/22/2018 0928   RBC 4.49 04/12/2019 1030   RBC 4.04 06/22/2018 0928   HGB 12.7 04/12/2019 1030   HCT 38.9 04/12/2019 1030   PLT 282 04/12/2019 1030   MCV 87 04/12/2019 1030   MCH 28.3 04/12/2019 1030   MCH 26.7 06/22/2018 0928   MCHC 32.6 04/12/2019 1030   MCHC 31.6 06/22/2018 0928    RDW 13.9 04/12/2019 1030   LYMPHSABS 1.4 06/22/2018 0928   MONOABS 0.4 06/22/2018 0928   EOSABS 0.1 06/22/2018 0928   BASOSABS 0.0 06/22/2018 9528    Lab Results  Component Value Date   HGBA1C 5.8 (H) 06/23/2018    Assessment & Plan:  Hypertension Uncontrolled on blood pressure is 149/84 despite not taking her antihypertensive It does appear she is on huge doses of her antihypertensives I have decreased carvedilol from 25 mg twice daily to 3.125 mg twice daily to prevent overmedication and she will continue this with spironolactone, Entresto She will follow-up with PCP for BP evaluation.    Hypertensive heart disease Euvolemic with a EF of 50 to 55% from 10/2021. Elevated blood pressure and history of non-compliance with medications. No current symptoms of heart failure such as shortness of breath or ankle swelling. -Resume Atorvastatin, Entresto, Furosemide, Potassium, and Spironolactone. -Order labs to check potassium and kidney function. -Repeat blood pressure measurement today. -Schedule follow-up appointment with Dr. Laural Benes.  Hyperlipidemia She is overdue for lipid panel Atorvastatin refilled Low-cholesterol diet General Health Maintenance -Recommend scheduling a well woman exam and mammogram.          Meds ordered this encounter  Medications   atorvastatin (LIPITOR) 80 MG tablet    Sig: Take 1 tablet (80 mg total) by mouth daily.  Dispense:  90 tablet    Refill:  1   carvedilol (COREG) 3.125 MG tablet    Sig: Take 1 tablet (3.125 mg total) by mouth 2 (two) times daily with a meal.    Dispense:  180 tablet    Refill:  1    Dose decrease   sacubitril-valsartan (ENTRESTO) 97-103 MG    Sig: Take 1 tablet by mouth 2 (two) times daily.    Dispense:  180 tablet    Refill:  1   furosemide (LASIX) 20 MG tablet    Sig: Take 1 tablet (20 mg total) by mouth daily.    Dispense:  90 tablet    Refill:  1   potassium chloride SA (KLOR-CON M) 20 MEQ tablet     Sig: Take 1 tablet (20 mEq total) by mouth daily.    Dispense:  30 tablet    Refill:  4   spironolactone (ALDACTONE) 25 MG tablet    Sig: Take 0.5 tablets (12.5 mg total) by mouth daily.    Dispense:  45 tablet    Refill:  1    Follow-up: Return in about 1 month (around 11/25/2023) for f/u blood pressure with Dr. Laural Benes.       Hoy Register, MD, FAAFP. [facility] [place], [place] [phone]   10/26/2023, 4:55 PM

## 2023-10-26 NOTE — Patient Instructions (Signed)
Your blood pressure is slightly elevated and so I have refilled your blood pressure medications but decreased your dose of carvedilol from 25 mg twice daily to 3.125 mg twice daily. Continue your spironolactone, Lasix, Entresto as prescribed and follow-up with your PCP for blood pressure recheck.

## 2023-10-27 LAB — CMP14+EGFR
ALT: 9 [IU]/L (ref 0–32)
AST: 16 [IU]/L (ref 0–40)
Albumin: 4.2 g/dL (ref 3.8–4.9)
Alkaline Phosphatase: 87 [IU]/L (ref 44–121)
BUN/Creatinine Ratio: 11 (ref 9–23)
BUN: 9 mg/dL (ref 6–24)
Bilirubin Total: <0.2 mg/dL (ref 0.0–1.2)
CO2: 22 mmol/L (ref 20–29)
Calcium: 9.4 mg/dL (ref 8.7–10.2)
Chloride: 105 mmol/L (ref 96–106)
Creatinine, Ser: 0.84 mg/dL (ref 0.57–1.00)
Globulin, Total: 3 g/dL (ref 1.5–4.5)
Glucose: 88 mg/dL (ref 70–99)
Potassium: 4.4 mmol/L (ref 3.5–5.2)
Sodium: 144 mmol/L (ref 134–144)
Total Protein: 7.2 g/dL (ref 6.0–8.5)
eGFR: 84 mL/min/{1.73_m2} (ref >59)

## 2023-10-27 LAB — HCV INTERPRETATION

## 2023-10-27 LAB — HCV AB W REFLEX TO QUANT PCR: HCV Ab: NONREACTIVE

## 2023-11-26 ENCOUNTER — Encounter: Payer: Self-pay | Admitting: Internal Medicine

## 2023-11-26 ENCOUNTER — Ambulatory Visit: Payer: BC Managed Care – PPO | Attending: Internal Medicine | Admitting: Internal Medicine

## 2023-11-26 VITALS — BP 144/81 | HR 75 | Temp 97.8°F | Ht 65.0 in | Wt 172.0 lb

## 2023-11-26 DIAGNOSIS — Z1231 Encounter for screening mammogram for malignant neoplasm of breast: Secondary | ICD-10-CM

## 2023-11-26 DIAGNOSIS — Z8673 Personal history of transient ischemic attack (TIA), and cerebral infarction without residual deficits: Secondary | ICD-10-CM | POA: Insufficient documentation

## 2023-11-26 DIAGNOSIS — I10 Essential (primary) hypertension: Secondary | ICD-10-CM

## 2023-11-26 DIAGNOSIS — Z862 Personal history of diseases of the blood and blood-forming organs and certain disorders involving the immune mechanism: Secondary | ICD-10-CM

## 2023-11-26 DIAGNOSIS — D508 Other iron deficiency anemias: Secondary | ICD-10-CM | POA: Diagnosis present

## 2023-11-26 DIAGNOSIS — Z1211 Encounter for screening for malignant neoplasm of colon: Secondary | ICD-10-CM

## 2023-11-26 DIAGNOSIS — I340 Nonrheumatic mitral (valve) insufficiency: Secondary | ICD-10-CM | POA: Diagnosis not present

## 2023-11-26 DIAGNOSIS — E663 Overweight: Secondary | ICD-10-CM

## 2023-11-26 DIAGNOSIS — E785 Hyperlipidemia, unspecified: Secondary | ICD-10-CM

## 2023-11-26 DIAGNOSIS — I773 Arterial fibromuscular dysplasia: Secondary | ICD-10-CM | POA: Diagnosis not present

## 2023-11-26 DIAGNOSIS — Z79899 Other long term (current) drug therapy: Secondary | ICD-10-CM | POA: Diagnosis not present

## 2023-11-26 DIAGNOSIS — I428 Other cardiomyopathies: Secondary | ICD-10-CM

## 2023-11-26 DIAGNOSIS — Z87891 Personal history of nicotine dependence: Secondary | ICD-10-CM | POA: Diagnosis not present

## 2023-11-26 NOTE — Patient Instructions (Signed)
I have given you a prescription to get a home blood pressure device.  Check your blood pressure at least twice a week and record the readings.  Bring those readings in with you when you come to see our clinical pharmacist in 2 to 3 weeks.  DASH Eating Plan DASH stands for Dietary Approaches to Stop Hypertension. The DASH eating plan is a healthy eating plan that has been shown to: Lower high blood pressure (hypertension). Reduce your risk for type 2 diabetes, heart disease, and stroke. Help with weight loss. What are tips for following this plan? Reading food labels Check food labels for the amount of salt (sodium) per serving. Choose foods with less than 5 percent of the Daily Value (DV) of sodium. In general, foods with less than 300 milligrams (mg) of sodium per serving fit into this eating plan. To find whole grains, look for the word "whole" as the first word in the ingredient list. Shopping Buy products labeled as "low-sodium" or "no salt added." Buy fresh foods. Avoid canned foods and pre-made or frozen meals. Cooking Try not to add salt when you cook. Use salt-free seasonings or herbs instead of table salt or sea salt. Check with your health care provider or pharmacist before using salt substitutes. Do not fry foods. Cook foods in healthy ways, such as baking, boiling, grilling, roasting, or broiling. Cook using oils that are good for your heart. These include olive, canola, avocado, soybean, and sunflower oil. Meal planning  Eat a balanced diet. This should include: 4 or more servings of fruits and 4 or more servings of vegetables each day. Try to fill half of your plate with fruits and vegetables. 6-8 servings of whole grains each day. 6 or less servings of lean meat, poultry, or fish each day. 1 oz is 1 serving. A 3 oz (85 g) serving of meat is about the same size as the palm of your hand. One egg is 1 oz (28 g). 2-3 servings of low-fat dairy each day. One serving is 1 cup (237  mL). 1 serving of nuts, seeds, or beans 5 times each week. 2-3 servings of heart-healthy fats. Healthy fats called omega-3 fatty acids are found in foods such as walnuts, flaxseeds, fortified milks, and eggs. These fats are also found in cold-water fish, such as sardines, salmon, and mackerel. Limit how much you eat of: Canned or prepackaged foods. Food that is high in trans fat, such as fried foods. Food that is high in saturated fat, such as fatty meat. Desserts and other sweets, sugary drinks, and other foods with added sugar. Full-fat dairy products. Do not salt foods before eating. Do not eat more than 4 egg yolks a week. Try to eat at least 2 vegetarian meals a week. Eat more home-cooked food and less restaurant, buffet, and fast food. Lifestyle When eating at a restaurant, ask if your food can be made with less salt or no salt. If you drink alcohol: Limit how much you have to: 0-1 drink a day if you are female. 0-2 drinks a day if you are female. Know how much alcohol is in your drink. In the [place] one drink is one 12 oz bottle of beer (355 mL), one 5 oz glass of wine (148 mL), or one 1 oz glass of hard liquor (44 mL). General information Avoid eating more than 2,300 mg of salt a day. If you have hypertension, you may need to reduce your sodium intake to 1,500 mg a day. Work  with your provider to stay at a healthy body weight or lose weight. Ask what the best weight range is for you. On most days of the week, get at least 30 minutes of exercise that causes your heart to beat faster. This may include walking, swimming, or biking. Work with your provider or dietitian to adjust your eating plan to meet your specific calorie needs. What foods should I eat? Fruits All fresh, dried, or frozen fruit. Canned fruits that are in their natural juice and do not have sugar added to them. Vegetables Fresh or frozen vegetables that are raw, steamed, roasted, or grilled. Low-sodium or  reduced-sodium tomato and vegetable juice. Low-sodium or reduced-sodium tomato sauce and tomato paste. Low-sodium or reduced-sodium canned vegetables. Grains Whole-grain or whole-wheat bread. Whole-grain or whole-wheat pasta. Scripter rice. Orpah Cobb. Bulgur. Whole-grain and low-sodium cereals. Pita bread. Low-fat, low-sodium crackers. Whole-wheat flour tortillas. Meats and other proteins Skinless chicken or [place]. Ground chicken or [place]. Pork with fat trimmed off. Fish and seafood. Egg whites. Dried beans, peas, or lentils. Unsalted nuts, nut butters, and seeds. Unsalted canned beans. Lean cuts of beef with fat trimmed off. Low-sodium, lean precooked or cured meat, such as sausages or meat loaves. Dairy Low-fat (1%) or fat-free (skim) milk. Reduced-fat, low-fat, or fat-free cheeses. Nonfat, low-sodium ricotta or cottage cheese. Low-fat or nonfat yogurt. Low-fat, low-sodium cheese. Fats and oils Soft margarine without trans fats. Vegetable oil. Reduced-fat, low-fat, or light mayonnaise and salad dressings (reduced-sodium). Canola, safflower, olive, avocado, soybean, and sunflower oils. Avocado. Seasonings and condiments Herbs. Spices. Seasoning mixes without salt. Other foods Unsalted popcorn and pretzels. Fat-free sweets. The items listed above may not be all the foods and drinks you can have. Talk to a dietitian to learn more. What foods should I avoid? Fruits Canned fruit in a light or heavy syrup. Fried fruit. Fruit in cream or butter sauce. Vegetables Creamed or fried vegetables. Vegetables in a cheese sauce. Regular canned vegetables that are not marked as low-sodium or reduced-sodium. Regular canned tomato sauce and paste that are not marked as low-sodium or reduced-sodium. Regular tomato and vegetable juices that are not marked as low-sodium or reduced-sodium. Rosita Fire. Olives. Grains Baked goods made with fat, such as croissants, muffins, or some breads. Dry pasta or rice meal  packs. Meats and other proteins Fatty cuts of meat. Ribs. Fried meat. Tomasa Blase. Bologna, salami, and other precooked or cured meats, such as sausages or meat loaves, that are not lean and low in sodium. Fat from the back of a pig (fatback). Bratwurst. Salted nuts and seeds. Canned beans with added salt. Canned or smoked fish. Whole eggs or egg yolks. Chicken or [place] with skin. Dairy Whole or 2% milk, cream, and half-and-half. Whole or full-fat cream cheese. Whole-fat or sweetened yogurt. Full-fat cheese. Nondairy creamers. Whipped toppings. Processed cheese and cheese spreads. Fats and oils Butter. Stick margarine. Lard. Shortening. Ghee. Bacon fat. Tropical oils, such as coconut, palm kernel, or palm oil. Seasonings and condiments Onion salt, garlic salt, seasoned salt, table salt, and sea salt. Worcestershire sauce. Tartar sauce. Barbecue sauce. Teriyaki sauce. Soy sauce, including reduced-sodium soy sauce. Steak sauce. Canned and packaged gravies. Fish sauce. Oyster sauce. Cocktail sauce. Store-bought horseradish. Ketchup. Mustard. Meat flavorings and tenderizers. Bouillon cubes. Hot sauces. Pre-made or packaged marinades. Pre-made or packaged taco seasonings. Relishes. Regular salad dressings. Other foods Salted popcorn and pretzels. The items listed above may not be all the foods and drinks you should avoid. Talk to a dietitian to learn  more. Where to find more information National Heart, Lung, and Blood Institute (NHLBI): BuffaloDryCleaner.gl American Heart Association (AHA): heart.org Academy of Nutrition and Dietetics: eatright.org National Kidney Foundation (NKF): kidney.org This information is not intended to replace advice given to you by your health care provider. Make sure you discuss any questions you have with your health care provider. Document Revised: 12/11/2022 Document Reviewed: 12/11/2022 Elsevier Patient Education  2024 [employer].

## 2023-11-26 NOTE — Progress Notes (Signed)
Patient ID: Catherine Gross, female    DOB: 11/13/1971  MRN: [id number]  CC: Hypertension (HTN f/u. /No questions / concerns/Yes to mammogram. Yes to pap for another appt. /)   Subjective: Catherine Gross is a 52 y.o. female who presents for chronic ds management. Her concerns today include:  Patient with history of nonischemic cardiomyopathy with recent EF of 55 to 60%, HTN, moderate mitral regurg which has improved over the past 3 years, tobacco dependence, chronic IDA, CVA 2019.   Discussed the use of AI scribe software for clinical note transcription with the patient, who gave verbal consent to proceed.  History of Present Illness   [name], with a history of hypertension, cardiomyopathy, mitral regurgitation, and a previous stroke, presents for a follow-up and medication refill.  She had been off her medications due to insurance issues and financial constraints but has recently restarted them after seeing Dr. Alvis Lemmings one mth ago. She has not had an echocardiogram since November 2022 and has not seen her cardiologist, Dr. Shirlee Latch since then as well. She plans to schedule an appointment with him after this visit.  HTN/NICM/Moderate MR/Hx CVA: She confirms that she is taking her prescribed medications, including carvedilol 3.125mg  twice daily, Entresto twice daily, spironolactone 25mg  half a tablet daily, furosemide 20mg  daily, and potassium supplement 20 mcg daily.  She has not taken medicines as yet for today.  She does not have a home blood pressure device and checks her blood pressure occasionally at places like [employer]. She reports limiting salt in her diet and denies any chest pain, shortness of breath, leg swelling, or heart racing. She admits to forgetting to take her atorvastatin 80mg  at night sometimes due to her work schedule and asks if she can take it in the morning instead. She confirms taking her aspirin regularly. -Had fibromuscular dysplasia seen in the right carotid artery  on angiogram done back in 2019 at the time of her stroke.  She has not had any dizziness. -Quit smoking since I last saw her 2 years ago.  Overweight for height: can do better with eating habits.  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 She no longer has menstrual cycles.   Tob Dep: She quit smoking about two years ago and works at an [employer] where she does a lot of walking and lifting boxes. She admits she could improve her eating habits, particularly by including more vegetables.     HM:  due for colon CA screen.  No fhx colon CA  Patient Active Problem List   Diagnosis Date Noted   Fibromuscular dysplasia (HCC) 04/11/2019   Left sided lacunar stroke (HCC) 07/08/2018   Hyperlipidemia    Cerebrovascular accident (CVA) (HCC) 06/22/2018   Dilated cardiomyopathy (HCC) 04/02/2015   Systolic heart failure (HCC) 04/02/2015   Essential hypertension 04/02/2015   Normocytic anemia      Current Outpatient Medications on File Prior to Visit  Medication Sig Dispense Refill   aspirin EC 81 MG EC tablet Take 1 tablet (81 mg total) by mouth daily. 30 tablet 0   atorvastatin (LIPITOR) 80 MG tablet Take 1 tablet (80 mg total) by mouth daily. 90 tablet 1   Blood Pressure Monitor DEVI Use as directed to check home blood pressure 2-3 times a week 1 Device 0   carvedilol (COREG) 3.125 MG tablet Take  1 tablet (3.125 mg total) by mouth 2 (two) times daily with a meal. 180 tablet 1   ferrous sulfate (FERROUSUL) 325 (65 FE) MG tablet Take 1 tablet (325 mg total) by mouth daily with breakfast. 100 tablet 1   furosemide (LASIX) 20 MG tablet Take 1 tablet (20 mg total) by mouth daily. 90 tablet 1   potassium chloride SA (KLOR-CON M) 20 MEQ tablet Take 1 tablet (20 mEq total) by mouth daily. 30 tablet 4   sacubitril-valsartan (ENTRESTO) 97-103 MG Take 1 tablet by mouth 2 (two) times daily. 180 tablet  1   spironolactone (ALDACTONE) 25 MG tablet Take 0.5 tablets (12.5 mg total) by mouth daily. 45 tablet 1   No current facility-administered medications on file prior to visit.    No Known Allergies  Social History   Socioeconomic History   Marital status: Single    Spouse name: Not on file   Number of children: Not on file   Years of education: Not on file   Highest education level: Not on file  Occupational History   Not on file  Tobacco Use   Smoking status: Former    Current packs/day: 0.00    Average packs/day: 0.5 packs/day for 20.0 years (10.0 ttl pk-yrs)    Types: Cigarettes    Start date: 03/22/1995    Quit date: 03/22/2015    Years since quitting: 8.6   Smokeless tobacco: Never  Vaping Use   Vaping status: Never Used  Substance and Sexual Activity   Alcohol use: No   Drug use: No   Sexual activity: Not on file  Other Topics Concern   Not on file  Social History Narrative   Not on file   Social Drivers of Health   Financial Resource Strain: Low Risk  (10/26/2023)   Overall Financial Resource Strain (CARDIA)    Difficulty of Paying Living Expenses: Not hard at all  Food Insecurity: No Food Insecurity (10/26/2023)   Hunger Vital Sign    Worried About Running Out of Food in the Last Year: Never true    Ran Out of Food in the Last Year: Never true  Transportation Needs: No Transportation Needs (10/26/2023)   PRAPARE - Administrator, Civil Service (Medical): No    Lack of Transportation (Non-Medical): No  Physical Activity: Patient Declined (10/26/2023)   Exercise Vital Sign    Days of Exercise per Week: Patient declined    Minutes of Exercise per Session: Patient declined  Stress: No Stress Concern Present (10/26/2023)   [employer] of Occupational Health - Occupational Stress Questionnaire    Feeling of Stress : Not at all  Social Connections: Moderately Integrated (10/26/2023)   Social Connection and Isolation Panel [NHANES]     Frequency of Communication with Friends and Family: More than three times a week    Frequency of Social Gatherings with Friends and Family: Once a week    Attends Religious Services: 1 to 4 times per year    Active Member of [employer] or Organizations: Yes    Attends Banker Meetings: 1 to 4 times per year    Marital Status: Never married  Intimate Partner Violence: Not At Risk (10/26/2023)   Humiliation, Afraid, Rape, and Kick questionnaire    Fear of Current or Ex-Partner: No    Emotionally Abused: No    Physically Abused: No    Sexually Abused: No    Family History  Problem Relation Age of Onset   Heart  failure Father    Hypertension Father    Congestive Heart Failure Father    Alzheimer's disease Mother    Asthma Brother     Past Surgical History:  Procedure Laterality Date   CESAREAN SECTION     RIGHT/LEFT HEART CATH AND CORONARY ANGIOGRAPHY N/A 09/02/2017   Procedure: RIGHT/LEFT HEART CATH AND CORONARY ANGIOGRAPHY;  Surgeon: Laurey Morale, MD;  Location: [facility] INVASIVE CV LAB;  Service: Cardiovascular;  Laterality: N/A;   TEE WITHOUT CARDIOVERSION N/A 09/02/2017   Procedure: TRANSESOPHAGEAL ECHOCARDIOGRAM (TEE);  Surgeon: Laurey Morale, MD;  Location: [facility] ENDOSCOPY;  Service: Cardiovascular;  Laterality: N/A;    ROS: Review of Systems Negative except as stated above  PHYSICAL EXAM: BP (!) 144/81 (BP Location: Left Arm, Patient Position: Sitting, Cuff Size: Normal)   Pulse 75   Temp 97.8 F (36.6 C) (Oral)   Ht 5\' 5"  (1.651 m)   Wt 172 lb (78 kg)   SpO2 98%   BMI 28.62 kg/m   Wt Readings from Last 3 Encounters:  11/26/23 172 lb (78 kg)  10/26/23 176 lb 12.8 oz (80.2 kg)  05/16/21 162 lb (73.5 kg)    Physical Exam  General appearance - alert, well appearing, middle age AAF and in no distress Mental status - normal mood, behavior, speech, dress, motor activity, and thought processes Neck - supple, no significant adenopathy Chest - clear to  auscultation, no wheezes, rales or rhonchi, symmetric air entry Heart - normal rate, regular rhythm, normal S1, S2, no murmurs, rubs, clicks or gallops Extremities - peripheral pulses normal, no pedal edema, no clubbing or cyanosis      Latest Ref Rng & Units 10/26/2023    5:00 PM 08/23/2021    8:43 AM 05/27/2021    9:41 AM  CMP  Glucose 70 - 99 mg/dL 88  086  98   BUN 6 - 24 mg/dL 9  10  9    Creatinine 0.57 - 1.00 mg/dL 5.78  4.69  6.29   Sodium 134 - 144 mmol/L 144  141  141   Potassium 3.5 - 5.2 mmol/L 4.4  3.6  3.9   Chloride 96 - 106 mmol/L 105  107  107   CO2 20 - 29 mmol/L 22  27  30    Calcium 8.7 - 10.2 mg/dL 9.4  9.3  9.5   Total Protein 6.0 - 8.5 g/dL 7.2     Total Bilirubin 0.0 - 1.2 mg/dL <5.2     Alkaline Phos 44 - 121 IU/L 87     AST 0 - 40 IU/L 16     ALT 0 - 32 IU/L 9      Lipid Panel     Component Value Date/Time   CHOL 222 (H) 05/16/2021 1231   CHOL 244 (H) 05/19/2018 1024   TRIG 74 05/16/2021 1231   HDL 62 05/16/2021 1231   HDL 40 05/19/2018 1024   CHOLHDL 3.6 05/16/2021 1231   VLDL 15 05/16/2021 1231   LDLCALC 145 (H) 05/16/2021 1231   LDLCALC 186 (H) 05/19/2018 1024    CBC    Component Value Date/Time   WBC 5.9 04/12/2019 1030   WBC 4.3 06/22/2018 0928   RBC 4.49 04/12/2019 1030   RBC 4.04 06/22/2018 0928   HGB 12.7 04/12/2019 1030   HCT 38.9 04/12/2019 1030   PLT 282 04/12/2019 1030   MCV 87 04/12/2019 1030   MCH 28.3 04/12/2019 1030   MCH 26.7 06/22/2018 0928   MCHC 32.6  04/12/2019 1030   MCHC 31.6 06/22/2018 0928   RDW 13.9 04/12/2019 1030   LYMPHSABS 1.4 06/22/2018 0928   MONOABS 0.4 06/22/2018 0928   EOSABS 0.1 06/22/2018 0928   BASOSABS 0.0 06/22/2018 0928    ASSESSMENT AND PLAN: 1. Essential hypertension (Primary) Not at goal but patient has not taken medicines as yet for today.  She will take them when she returns home. -Given prescription to get a home blood pressure monitoring device.  Advised to check twice a week and  record the readings.  Will bring her back to see the clinical pharmacist in several weeks. Continue carvedilol 3.25 mg twice a day, spironolactone 25 mg half a tablet daily, Entresto twice a day. - Basic Metabolic Panel - For home use only DME Other see comment  2. NICM (nonischemic cardiomyopathy) (HCC) Stable and compensated.  Will get her back in with cardiology.  Continue Entresto 97/103 mg twice a day, carvedilol 3.125 mg twice a day, spironolactone 25 mg half a tablet daily and furosemide 20 mg daily. Will likely need repeat echo. - Ambulatory referral to Cardiology  3. Hyperlipidemia, unspecified hyperlipidemia type Continue atorvastatin 80 mg daily. - Lipid panel  4. Fibromuscular dysplasia (HCC) Continue aspirin and atorvastatin  5. Mitral valve insufficiency, unspecified etiology Asymptomatic. - Ambulatory referral to Cardiology  6. Former smoker Commended her on quitting.  Encouraged her to remain tobacco free  7. Overweight (BMI 25.0-29.9) Patient advised to eliminate sugary drinks from the diet, cut back on portion sizes especially of white carbohydrates, eat more white lean meat like chicken [place] and seafood instead of beef or pork and incorporate fresh fruits and vegetables into the diet daily.   8. Screening for colon cancer - Ambulatory referral to Gastroenterology  9. Encounter for screening mammogram for malignant neoplasm of breast - MM 3D SCREENING MAMMOGRAM BILATERAL BREAST; Future  10. History of iron deficiency anemia Recheck CBC today - CBC     Patient was given the opportunity to ask questions.  Patient verbalized understanding of the plan and was able to repeat key elements of the plan.   This documentation was completed using Paediatric nurse.  Any transcriptional errors are unintentional.  Orders Placed This Encounter  Procedures   For home use only DME Other see comment   MM 3D SCREENING MAMMOGRAM BILATERAL BREAST   CBC    Lipid panel   Basic Metabolic Panel   Ambulatory referral to Gastroenterology   Ambulatory referral to Cardiology     Requested Prescriptions    No prescriptions requested or ordered in this encounter    Return in about 2 months (around 01/27/2024) for PAP with me in 2 mths. Appt with Franky Macho in 2 wks for BP check.  Jonah Blue, MD, FACP

## 2023-11-27 LAB — BASIC METABOLIC PANEL
BUN/Creatinine Ratio: 13 (ref 9–23)
BUN: 11 mg/dL (ref 6–24)
CO2: 22 mmol/L (ref 20–29)
Calcium: 9.7 mg/dL (ref 8.7–10.2)
Chloride: 108 mmol/L — ABNORMAL HIGH (ref 96–106)
Creatinine, Ser: 0.83 mg/dL (ref 0.57–1.00)
Glucose: 95 mg/dL (ref 70–99)
Potassium: 4.4 mmol/L (ref 3.5–5.2)
Sodium: 144 mmol/L (ref 134–144)
eGFR: 85 mL/min/{1.73_m2} (ref >59)

## 2023-11-27 LAB — LIPID PANEL
Chol/HDL Ratio: 5.5 {ratio} — ABNORMAL HIGH (ref 0.0–4.4)
Cholesterol, Total: 264 mg/dL — ABNORMAL HIGH (ref 100–199)
HDL: 48 mg/dL (ref >39)
LDL Chol Calc (NIH): 205 mg/dL — ABNORMAL HIGH (ref 0–99)
Triglycerides: 70 mg/dL (ref 0–149)
VLDL Cholesterol Cal: 11 mg/dL (ref 5–40)

## 2023-11-27 LAB — CBC
Hematocrit: 38.3 % (ref 34.0–46.6)
Hemoglobin: 12.5 g/dL (ref 11.1–15.9)
MCH: 27.8 pg (ref 26.6–33.0)
MCHC: 32.6 g/dL (ref 31.5–35.7)
MCV: 85 fL (ref 79–97)
Platelets: 235 10*3/uL (ref 150–450)
RBC: 4.49 x10E6/uL (ref 3.77–5.28)
RDW: 13.5 % (ref 11.7–15.4)
WBC: 5.3 10*3/uL (ref 3.4–10.8)

## 2023-11-28 NOTE — Progress Notes (Signed)
Cholesterol levels significantly elevated at 205 with goal being less than 100.  High cholesterol increases risk for heart attack and strokes.  Please confirm whether or not she has been taking the atorvastatin 80 mg consistently every day as prescribed.  If she has been taking it consistently and cholesterol level is still this high, she needs to speak with her cardiologist Dr. Shirlee Latch when she sees him about this as there are other cholesterol-lowering medications given by injection that can be prescribed. Kidney function is good.  Potassium level normal.

## 2023-12-10 ENCOUNTER — Ambulatory Visit: Payer: BC Managed Care – PPO | Attending: Internal Medicine | Admitting: Pharmacist

## 2023-12-10 ENCOUNTER — Encounter: Payer: Self-pay | Admitting: Pharmacist

## 2023-12-10 VITALS — BP 132/80 | HR 78

## 2023-12-10 DIAGNOSIS — I10 Essential (primary) hypertension: Secondary | ICD-10-CM

## 2023-12-10 NOTE — Progress Notes (Signed)
   S:     No chief complaint on file.  53 y.o. female who presents for hypertension evaluation, education, and management.  PMH is significant for HTN, nonischemic cardiomyoptahy (dx'd in 2016 as HFrEF secondary to longstanding uncontrolled HTN with initial EF of 35-40%) w/ most recent EF of 50-55% in 2022, prior CVA in 2019.   Patient was referred and last seen by Primary Care Provider, Dr. Vicci, on 11/26/2023. At that visit, BP was 144/81 mmHg but pt did not take BP medications the morning of the appt.   Today, patient arrives in good spirits and presents without assistance. Denies dizziness, headache, blurred vision, swelling. Patient reports hypertension was diagnosed is longstanding. Has plans to re-establish with Dr. Rolan.   Family/Social history:  Fhx: alzheimer's disease, CHF, HTN, asthma Tobacco: former smoker (quit in 2016) Alcohol: none reported   Medication adherence reported. Patient has taken BP medications today.   Current antihypertensives include: carvedilol  3.125 mg BID, Entresto  97-103 mg BID, furosemide  20 mg daily, spironolactone  12.5 mg daily  Patient reported dietary habits:  -Compliant with sodium restriction -Denies excessive intake of caffeine   Patient-reported exercise habits:  -None reported. Of note, she did mention in previous encounters that she's active at her place of employment.   O:  Vitals:   12/10/23 1054  BP: 132/80  Pulse: 78     Last 3 Office BP readings: BP Readings from Last 3 Encounters:  12/10/23 132/80  11/26/23 (!) 144/81  10/26/23 (!) 149/84    BMET    Component Value Date/Time   NA 144 11/26/2023 1126   K 4.4 11/26/2023 1126   CL 108 (H) 11/26/2023 1126   CO2 22 11/26/2023 1126   GLUCOSE 95 11/26/2023 1126   GLUCOSE 143 (H) 08/23/2021 0843   BUN 11 11/26/2023 1126   CREATININE 0.83 11/26/2023 1126   CREATININE 0.90 01/20/2017 1052   CALCIUM  9.7 11/26/2023 1126   GFRNONAA >60 08/23/2021 0843   GFRAA >60  12/12/2019 1007    Renal function: CrCl cannot be calculated (Unknown ideal weight.).  Clinical ASCVD: Yes The ASCVD Risk score (Arnett DK, et al., 2019) failed to calculate for the following reasons:   Risk score cannot be calculated because patient has a medical history suggesting prior/existing ASCVD  Patient is participating in a Managed Medicaid Plan: No   A/P: Hypertension diagnosed currently close to goal on current medications. BP goal < 130/80 mmHg. Medication adherence appears appropriate. With her cardiac hx, we should consider SGLT-2i use with Farxiga 10mg  daily for improved CV outcomes. Renal function looks good. Will defer to her Cardiology team.  -Continued current regimen for now.  -Counseled on lifestyle modifications for blood pressure control including reduced dietary sodium, increased exercise, adequate sleep. -Encouraged patient to check BP at home and bring log of readings to next visit. Counseled on proper use of home BP cuff.   Results reviewed and written information provided.    Written patient instructions provided. Patient verbalized understanding of treatment plan.  Total time in face to face counseling 20 minutes.    Follow-up:  Pharmacist prn. PCP clinic visit next month.   Herlene Fleeta Morris, PharmD, JAQUELINE, CPP Clinical Pharmacist [facility] [phone]

## 2023-12-18 ENCOUNTER — Ambulatory Visit
Admission: RE | Admit: 2023-12-18 | Discharge: 2023-12-18 | Disposition: A | Discharge status: Home / Self Care | Payer: BC Managed Care – PPO | Source: Ambulatory Visit | Attending: Internal Medicine | Admitting: Internal Medicine

## 2023-12-18 DIAGNOSIS — Z1231 Encounter for screening mammogram for malignant neoplasm of breast: Secondary | ICD-10-CM

## 2024-01-15 ENCOUNTER — Telehealth (HOSPITAL_COMMUNITY): Payer: Self-pay | Admitting: Cardiology

## 2024-01-15 NOTE — Telephone Encounter (Signed)
 Called patient at [phone] and left patient a detailed voice message about getting her a f/u appointment in the [facility] clinic.   Patient is in the referral work queue and per [employer], RN this patient last saw Dr. Rolan in 2022 and it is okay to schedule her a f/u appt.   Front office left message for patient to call [facility] clinic office back to get scheduled.

## 2024-01-21 ENCOUNTER — Telehealth (HOSPITAL_COMMUNITY): Payer: Self-pay | Admitting: Cardiology

## 2024-01-21 NOTE — Telephone Encounter (Signed)
Front office called patient at [phone] to schedule a follow up appointment from referral.   Per Herbert Seta, RN this patient last saw Dr. Shirlee Latch in 2022 and needs a follow up appointment.   Front office was unable to reach patient and unable to speak directly to patient. Office staff left patient a voice message to call office back to schedule a f/u appointment.

## 2024-01-29 ENCOUNTER — Encounter: Payer: Self-pay | Admitting: Internal Medicine

## 2024-01-29 ENCOUNTER — Encounter: Payer: Self-pay | Admitting: Gastroenterology

## 2024-01-29 ENCOUNTER — Ambulatory Visit: Payer: BC Managed Care – PPO | Attending: Internal Medicine | Admitting: Internal Medicine

## 2024-01-29 ENCOUNTER — Other Ambulatory Visit (HOSPITAL_COMMUNITY)
Admission: RE | Admit: 2024-01-29 | Discharge: 2024-01-29 | Disposition: A | Discharge status: Home / Self Care | Payer: BC Managed Care – PPO | Source: Ambulatory Visit | Attending: Internal Medicine | Admitting: Internal Medicine

## 2024-01-29 VITALS — BP 121/77 | HR 81 | Ht 65.0 in | Wt 170.0 lb

## 2024-01-29 DIAGNOSIS — Z124 Encounter for screening for malignant neoplasm of cervix: Secondary | ICD-10-CM | POA: Insufficient documentation

## 2024-01-29 DIAGNOSIS — Z862 Personal history of diseases of the blood and blood-forming organs and certain disorders involving the immune mechanism: Secondary | ICD-10-CM

## 2024-01-29 DIAGNOSIS — Z113 Encounter for screening for infections with a predominantly sexual mode of transmission: Secondary | ICD-10-CM | POA: Insufficient documentation

## 2024-01-29 DIAGNOSIS — E782 Mixed hyperlipidemia: Secondary | ICD-10-CM | POA: Diagnosis not present

## 2024-01-29 DIAGNOSIS — Z2821 Immunization not carried out because of patient refusal: Secondary | ICD-10-CM | POA: Diagnosis not present

## 2024-01-29 NOTE — Progress Notes (Signed)
Patient ID: Davene Jobin, female    DOB: 05/30/1971  MRN: [id number]  CC: Gynecologic Exam (Pap./No questions / concerns/No to all vax)   Subjective: Graelyn Tsou is a 53 y.o. female who presents for PAP Her concerns today include:  Patient with history of nonischemic cardiomyopathy with recent EF of 55 to 60%, HTN, moderate mitral regurg which has improved over the past 3 years, tobacco dependence, chronic IDA, CVA 2019.   GYN History:  Pt is G4P2 (2 miscarriages) Any hx of abn paps?: no Menses regular or irregular?: postmenopausal x 3-4 yrs How long does menses last? NA Menstrual flow light or heavy?: NA Method of birth control?:  NA Any vaginal dischg at this time?: no Dysuria?: no Any hx of STI?: Trichomonas Sexually active with how many partners:  1 female partner Desires STI screen: yes Last MMG: 1/25 Family hx of uterine, cervical or breast cancer?:  no  HL:   When I last saw her in December 2024, we did lipid profile.  This revealed a total cholesterol of 264 with LDL of 205. pt was not taking the Lipitor 80 mg consistently but started taking it consistently since then.  I referred her for follow-up with her cardiologist Dr. Shirlee Latch.  Patient states she did speak with them but was told that he did not have any appointment at that time.  She was told to call back next week which he intends to do.  In terms of the history of iron deficiency anemia, hemoglobin was normal on last visit.  She is no longer taking iron supplement.  HM:  Declines shingles vaccine and PCV.  Was called for GI appt.  Will call back today Patient Active Problem List   Diagnosis Date Noted   Former smoker 11/26/2023   Fibromuscular dysplasia (HCC) 04/11/2019   Left sided lacunar stroke (HCC) 07/08/2018   Hyperlipidemia    Cerebrovascular accident (CVA) (HCC) 06/22/2018   Dilated cardiomyopathy (HCC) 04/02/2015   Systolic heart failure (HCC) 04/02/2015   Essential hypertension 04/02/2015    Normocytic anemia      Current Outpatient Medications on File Prior to Visit  Medication Sig Dispense Refill   aspirin EC 81 MG EC tablet Take 1 tablet (81 mg total) by mouth daily. 30 tablet 0   atorvastatin (LIPITOR) 80 MG tablet Take 1 tablet (80 mg total) by mouth daily. 90 tablet 1   Blood Pressure Monitor DEVI Use as directed to check home blood pressure 2-3 times a week 1 Device 0   carvedilol (COREG) 3.125 MG tablet Take 1 tablet (3.125 mg total) by mouth 2 (two) times daily with a meal. 180 tablet 1   furosemide (LASIX) 20 MG tablet Take 1 tablet (20 mg total) by mouth daily. 90 tablet 1   potassium chloride SA (KLOR-CON M) 20 MEQ tablet Take 1 tablet (20 mEq total) by mouth daily. 30 tablet 4   sacubitril-valsartan (ENTRESTO) 97-103 MG Take 1 tablet by mouth 2 (two) times daily. 180 tablet 1   spironolactone (ALDACTONE) 25 MG tablet Take 0.5 tablets (12.5 mg total) by mouth daily. 45 tablet 1   No current facility-administered medications on file prior to visit.    No Known Allergies  Social History   Socioeconomic History   Marital status: Single    Spouse name: Not on file   Number of children: Not on file   Years of education: Not on file   Highest education level: Not on file  Occupational History  Not on file  Tobacco Use   Smoking status: Former    Current packs/day: 0.00    Average packs/day: 0.5 packs/day for 20.0 years (10.0 ttl pk-yrs)    Types: Cigarettes    Start date: 03/22/1995    Quit date: 03/22/2015    Years since quitting: 8.8   Smokeless tobacco: Never  Vaping Use   Vaping status: Never Used  Substance and Sexual Activity   Alcohol use: No   Drug use: No   Sexual activity: Not on file  Other Topics Concern   Not on file  Social History Narrative   Not on file   Social Drivers of Health   Financial Resource Strain: Low Risk  (10/26/2023)   Overall Financial Resource Strain (CARDIA)    Difficulty of Paying Living Expenses: Not hard at all   Food Insecurity: No Food Insecurity (10/26/2023)   Hunger Vital Sign    Worried About Running Out of Food in the Last Year: Never true    Ran Out of Food in the Last Year: Never true  Transportation Needs: No Transportation Needs (10/26/2023)   PRAPARE - Administrator, Civil Service (Medical): No    Lack of Transportation (Non-Medical): No  Physical Activity: Patient Declined (10/26/2023)   Exercise Vital Sign    Days of Exercise per Week: Patient declined    Minutes of Exercise per Session: Patient declined  Stress: No Stress Concern Present (10/26/2023)   [employer] of Occupational Health - Occupational Stress Questionnaire    Feeling of Stress : Not at all  Social Connections: Moderately Integrated (10/26/2023)   Social Connection and Isolation Panel [NHANES]    Frequency of Communication with Friends and Family: More than three times a week    Frequency of Social Gatherings with Friends and Family: Once a week    Attends Religious Services: 1 to 4 times per year    Active Member of [employer] or Organizations: Yes    Attends Banker Meetings: 1 to 4 times per year    Marital Status: Never married  Intimate Partner Violence: Not At Risk (10/26/2023)   Humiliation, Afraid, Rape, and Kick questionnaire    Fear of Current or Ex-Partner: No    Emotionally Abused: No    Physically Abused: No    Sexually Abused: No    Family History  Problem Relation Age of Onset   Heart failure Father    Hypertension Father    Congestive Heart Failure Father    Alzheimer's disease Mother    Asthma Brother     Past Surgical History:  Procedure Laterality Date   CESAREAN SECTION     RIGHT/LEFT HEART CATH AND CORONARY ANGIOGRAPHY N/A 09/02/2017   Procedure: RIGHT/LEFT HEART CATH AND CORONARY ANGIOGRAPHY;  Surgeon: Laurey Morale, MD;  Location: [facility] INVASIVE CV LAB;  Service: Cardiovascular;  Laterality: N/A;   TEE WITHOUT CARDIOVERSION N/A 09/02/2017   Procedure:  TRANSESOPHAGEAL ECHOCARDIOGRAM (TEE);  Surgeon: Laurey Morale, MD;  Location: [facility] ENDOSCOPY;  Service: Cardiovascular;  Laterality: N/A;    ROS: Review of Systems Negative except as stated above  PHYSICAL EXAM: BP 121/77 (BP Location: Left Arm, Patient Position: Sitting, Cuff Size: Normal)   Pulse 81   Ht 5\' 5"  (1.651 m)   Wt 170 lb (77.1 kg)   SpO2 100%   BMI 28.29 kg/m   Physical Exam  General appearance - alert, well appearing, middle-age African-American female and in no distress Mental status - normal mood,  behavior, speech, dress, motor activity, and thought processes Pelvic - CMA Clarisa present: normal external genitalia, vulva, vagina, cervix, uterus and adnexa. Cervical os is stenosed.       Latest Ref Rng & Units 11/26/2023   11:26 AM 10/26/2023    5:00 PM 08/23/2021    8:43 AM  CMP  Glucose 70 - 99 mg/dL 95  88  782   BUN 6 - 24 mg/dL 11  9  10    Creatinine 0.57 - 1.00 mg/dL 9.56  2.13  0.86   Sodium 134 - 144 mmol/L 144  144  141   Potassium 3.5 - 5.2 mmol/L 4.4  4.4  3.6   Chloride 96 - 106 mmol/L 108  105  107   CO2 20 - 29 mmol/L 22  22  27    Calcium 8.7 - 10.2 mg/dL 9.7  9.4  9.3   Total Protein 6.0 - 8.5 g/dL  7.2    Total Bilirubin 0.0 - 1.2 mg/dL  <5.7    Alkaline Phos 44 - 121 IU/L  87    AST 0 - 40 IU/L  16    ALT 0 - 32 IU/L  9     Lipid Panel     Component Value Date/Time   CHOL 264 (H) 11/26/2023 1126   TRIG 70 11/26/2023 1126   HDL 48 11/26/2023 1126   CHOLHDL 5.5 (H) 11/26/2023 1126   CHOLHDL 3.6 05/16/2021 1231   VLDL 15 05/16/2021 1231   LDLCALC 205 (H) 11/26/2023 1126    CBC    Component Value Date/Time   WBC 5.3 11/26/2023 1126   WBC 4.3 06/22/2018 0928   RBC 4.49 11/26/2023 1126   RBC 4.04 06/22/2018 0928   HGB 12.5 11/26/2023 1126   HCT 38.3 11/26/2023 1126   PLT 235 11/26/2023 1126   MCV 85 11/26/2023 1126   MCH 27.8 11/26/2023 1126   MCH 26.7 06/22/2018 0928   MCHC 32.6 11/26/2023 1126   MCHC 31.6 06/22/2018 0928    RDW 13.5 11/26/2023 1126   LYMPHSABS 1.4 06/22/2018 0928   MONOABS 0.4 06/22/2018 0928   EOSABS 0.1 06/22/2018 0928   BASOSABS 0.0 06/22/2018 0928    ASSESSMENT AND PLAN: 1. Pap smear for cervical cancer screening (Primary) -Patient informed that the cervical os was stenosed which means that we may not have been able to get adequate sampling of cells from inside the cervical os. - Cervicovaginal ancillary only - Cytology - PAP  2. Mixed hyperlipidemia Total and LDL cholesterol were quite elevated.  Patient admits that she was not taking the Lipitor 80 mg consistently but has started doing so after lab draw in December.  She may have a component of familial hyperlipidemia.  We will check lipid profile today to see if it has improved. - Lipid panel  3. History of iron deficiency anemia No longer taking iron supplement.  4. Pneumococcal vaccination declined Recommended.  Patient declined  5. Herpes zoster vaccination declined Recommended.  Patient declined.  .   Patient was given the opportunity to ask questions.  Patient verbalized understanding of the plan and was able to repeat key elements of the plan.   This documentation was completed using Paediatric nurse.  Any transcriptional errors are unintentional.  Orders Placed This Encounter  Procedures   Lipid panel     Requested Prescriptions    No prescriptions requested or ordered in this encounter    Return in about 4 months (around 05/28/2024).  Gavin Pound  Laural Benes, MD, Jerrel Ivory

## 2024-01-30 ENCOUNTER — Other Ambulatory Visit: Payer: Self-pay | Admitting: Internal Medicine

## 2024-01-30 ENCOUNTER — Encounter: Payer: Self-pay | Admitting: Internal Medicine

## 2024-01-30 LAB — LIPID PANEL
Chol/HDL Ratio: 3.5 {ratio} (ref 0.0–4.4)
Cholesterol, Total: 180 mg/dL (ref 100–199)
HDL: 51 mg/dL (ref >39)
LDL Chol Calc (NIH): 113 mg/dL — ABNORMAL HIGH (ref 0–99)
Triglycerides: 84 mg/dL (ref 0–149)
VLDL Cholesterol Cal: 16 mg/dL (ref 5–40)

## 2024-01-30 MED ORDER — EZETIMIBE 10 MG PO TABS: 10.0000 mg | ORAL_TABLET | Freq: Every day | ORAL | 1 refills | Status: DC

## 2024-02-01 ENCOUNTER — Encounter: Payer: Self-pay | Admitting: Internal Medicine

## 2024-02-01 ENCOUNTER — Other Ambulatory Visit: Payer: Self-pay | Admitting: Internal Medicine

## 2024-02-01 LAB — CERVICOVAGINAL ANCILLARY ONLY
Bacterial Vaginitis (gardnerella): POSITIVE — AB
Candida Glabrata: NEGATIVE
Candida Vaginitis: NEGATIVE
Chlamydia: NEGATIVE
Comment: NEGATIVE
Comment: NEGATIVE
Comment: NEGATIVE
Comment: NEGATIVE
Comment: NEGATIVE
Comment: NORMAL
Neisseria Gonorrhea: NEGATIVE
Trichomonas: NEGATIVE

## 2024-02-01 LAB — CYTOLOGY - PAP
Comment: NEGATIVE
Diagnosis: NEGATIVE
High risk HPV: NEGATIVE

## 2024-02-01 MED ORDER — METRONIDAZOLE 500 MG PO TABS: 500.0000 mg | ORAL_TABLET | Freq: Two times a day (BID) | ORAL | 0 refills | Status: DC

## 2024-02-26 ENCOUNTER — Ambulatory Visit: Payer: BC Managed Care – PPO

## 2024-02-26 VITALS — Ht 65.0 in | Wt 172.0 lb

## 2024-02-26 DIAGNOSIS — Z1211 Encounter for screening for malignant neoplasm of colon: Secondary | ICD-10-CM

## 2024-02-26 MED ORDER — SUFLAVE 178.7 G PO SOLR: 1.0000 | Freq: Once | ORAL | 0 refills | Status: AC

## 2024-02-26 NOTE — Progress Notes (Signed)

## 2024-03-04 ENCOUNTER — Other Ambulatory Visit (HOSPITAL_COMMUNITY): Payer: Self-pay | Admitting: *Deleted

## 2024-03-04 DIAGNOSIS — I5022 Chronic systolic (congestive) heart failure: Secondary | ICD-10-CM

## 2024-03-18 ENCOUNTER — Ambulatory Visit: Payer: BC Managed Care – PPO | Admitting: Gastroenterology

## 2024-03-18 ENCOUNTER — Encounter: Payer: Self-pay | Admitting: Gastroenterology

## 2024-03-18 VITALS — BP 115/71 | HR 66 | Temp 97.3°F | Resp 13 | Ht 65.0 in | Wt 172.0 lb

## 2024-03-18 DIAGNOSIS — D125 Benign neoplasm of sigmoid colon: Secondary | ICD-10-CM

## 2024-03-18 DIAGNOSIS — K5730 Diverticulosis of large intestine without perforation or abscess without bleeding: Secondary | ICD-10-CM | POA: Diagnosis not present

## 2024-03-18 DIAGNOSIS — K644 Residual hemorrhoidal skin tags: Secondary | ICD-10-CM

## 2024-03-18 DIAGNOSIS — D121 Benign neoplasm of appendix: Secondary | ICD-10-CM

## 2024-03-18 DIAGNOSIS — K635 Polyp of colon: Secondary | ICD-10-CM | POA: Diagnosis not present

## 2024-03-18 DIAGNOSIS — K634 Enteroptosis: Secondary | ICD-10-CM

## 2024-03-18 DIAGNOSIS — Z1211 Encounter for screening for malignant neoplasm of colon: Secondary | ICD-10-CM | POA: Diagnosis present

## 2024-03-18 DIAGNOSIS — K648 Other hemorrhoids: Secondary | ICD-10-CM

## 2024-03-18 DIAGNOSIS — D120 Benign neoplasm of cecum: Secondary | ICD-10-CM

## 2024-03-18 MED ORDER — SODIUM CHLORIDE 0.9 % IV SOLN: 500.0000 mL | Freq: Once | INTRAVENOUS | Status: DC

## 2024-03-18 NOTE — Op Note (Signed)
 [facility] Patient Name: Catherine Gross Procedure Date: 03/18/2024 9:14 AM MRN: [id number] Endoscopist: Napoleon Form , MD, [id number] Age: 53 Referring MD:  Date of Birth: [birth year] Gender: Female Account #: [id number] Procedure:                Colonoscopy Indications:              Screening for colorectal malignant neoplasm Medicines:                Monitored Anesthesia Care Procedure:                Pre-Anesthesia Assessment:                           - Prior to the procedure, a History and Physical                            was performed, and patient medications and                            allergies were reviewed. The patient's tolerance of                            previous anesthesia was also reviewed. The risks                            and benefits of the procedure and the sedation                            options and risks were discussed with the patient.                            All questions were answered, and informed consent                            was obtained. Prior Anticoagulants: The patient has                            taken no anticoagulant or antiplatelet agents. ASA                            Grade Assessment: II - A patient with mild systemic                            disease. After reviewing the risks and benefits,                            the patient was deemed in satisfactory condition to                            undergo the procedure.                           After obtaining informed consent, the colonoscope  was passed under direct vision. Throughout the                            procedure, the patient's blood pressure, pulse, and                            oxygen saturations were monitored continuously. The                            Olympus Scope [id number] was introduced through the                            anus and advanced to the the cecum. The colonoscopy                            was  performed without difficulty. The patient                            tolerated the procedure well. The quality of the                            bowel preparation was good. The ileocecal valve,                            appendiceal orifice, and rectum were photographed. Scope In: 9:20:25 AM Scope Out: 9:38:30 AM Scope Withdrawal Time: 0 hours 14 minutes 51 seconds  Total Procedure Duration: 0 hours 18 minutes 5 seconds  Findings:                 The perianal and digital rectal examinations were                            normal.                           A 7 mm polyp was found in the appendiceal orifice.                            The polyp was pedunculated. The polyp was removed                            with a hot snare. Resection and retrieval were                            complete.                           Three semi-pedunculated polyps were found in the                            sigmoid colon. The polyps were 4 to 10 mm in size.                            These polyps were removed with a cold and  hot                            snare. Resection and retrieval were complete.                           Scattered large-mouthed, medium-mouthed and                            small-mouthed diverticula were found in the sigmoid                            colon, descending colon, transverse colon and                            ascending colon. There was evidence of diverticular                            spasm. Peri-diverticular erythema was seen. There                            was evidence of an impacted diverticulum.                           Non-bleeding external and internal hemorrhoids were                            found during retroflexion. The hemorrhoids were                            medium-sized. Complications:            No immediate complications. Estimated Blood Loss:     Estimated blood loss was minimal. Impression:               - One 7 mm polyp at the appendiceal  orifice,                            removed with a hot snare. Resected and retrieved.                           - Three 4 to 10 mm polyps in the sigmoid colon,                            removed with a hot snare. Resected and retrieved.                           - Moderate diverticulosis in the sigmoid colon, in                            the descending colon, in the transverse colon and                            in the ascending colon. There was evidence of  diverticular spasm. Peri-diverticular erythema was                            seen. There was evidence of an impacted                            diverticulum.                           - Non-bleeding external and internal hemorrhoids. Recommendation:           - Patient has a contact number available for                            emergencies. The signs and symptoms of potential                            delayed complications were discussed with the                            patient. Return to normal activities tomorrow.                            Written discharge instructions were provided to the                            patient.                           - Resume previous diet.                           - Continue present medications.                           - Await pathology results.                           - Repeat colonoscopy in 1 year for surveillance                            based on pathology results. Napoleon Form, MD 03/18/2024 9:47:19 AM This report has been signed electronically.

## 2024-03-18 NOTE — Progress Notes (Signed)
 Called to room to assist during endoscopic procedure.  Patient ID and intended procedure confirmed with present staff. Received instructions for my participation in the procedure from the performing physician.

## 2024-03-18 NOTE — Progress Notes (Signed)
 [facility] History and Physical   Primary Care Physician:  Marcine Matar, MD   Reason for Procedure:  Colorectal cancer screening  Plan:    Screening colonoscopy with possible interventions as needed     HPI: Catherine Gross is a very pleasant 53 y.o. female here for screening colonoscopy. Denies any nausea, vomiting, abdominal pain, melena or bright red blood per rectum  The risks and benefits as well as alternatives of endoscopic procedure(s) have been discussed and reviewed. All questions answered. The patient agrees to proceed.    Past Medical History:  Diagnosis Date   Benign essential HTN    Cardiomyopathy (HCC)    Hyperlipidemia    Mitral regurgitation    Preeclampsia    first pregnancy, not with susequent pregnancy   Stroke [facility])    2018    Past Surgical History:  Procedure Laterality Date   CESAREAN SECTION     RIGHT/LEFT HEART CATH AND CORONARY ANGIOGRAPHY N/A 09/02/2017   Procedure: RIGHT/LEFT HEART CATH AND CORONARY ANGIOGRAPHY;  Surgeon: Laurey Morale, MD;  Location: [facility] INVASIVE CV LAB;  Service: Cardiovascular;  Laterality: N/A;   TEE WITHOUT CARDIOVERSION N/A 09/02/2017   Procedure: TRANSESOPHAGEAL ECHOCARDIOGRAM (TEE);  Surgeon: Laurey Morale, MD;  Location: [facility] ENDOSCOPY;  Service: Cardiovascular;  Laterality: N/A;    Prior to Admission medications   Medication Sig Start Date End Date Taking? Authorizing Provider  aspirin EC 81 MG EC tablet Take 1 tablet (81 mg total) by mouth daily. 06/25/18  Yes Regalado, Belkys A, MD  atorvastatin (LIPITOR) 80 MG tablet Take 1 tablet (80 mg total) by mouth daily. 10/26/23  Yes Hoy Register, MD  carvedilol (COREG) 3.125 MG tablet Take 1 tablet (3.125 mg total) by mouth 2 (two) times daily with a meal. 10/26/23  Yes Newlin, Enobong, MD  ezetimibe (ZETIA) 10 MG tablet Take 1 tablet (10 mg total) by mouth daily. 01/30/24  Yes Marcine Matar, MD  furosemide (LASIX) 20 MG tablet Take 1 tablet  (20 mg total) by mouth daily. 10/26/23  Yes Newlin, Odette Horns, MD  sacubitril-valsartan (ENTRESTO) 97-103 MG Take 1 tablet by mouth 2 (two) times daily. 10/26/23  Yes Hoy Register, MD  spironolactone (ALDACTONE) 25 MG tablet Take 0.5 tablets (12.5 mg total) by mouth daily. 10/26/23  Yes Hoy Register, MD  Blood Pressure Monitor DEVI Use as directed to check home blood pressure 2-3 times a week 04/11/19   Marcine Matar, MD  potassium chloride SA (KLOR-CON M) 20 MEQ tablet Take 1 tablet (20 mEq total) by mouth daily. Patient not taking: Reported on 03/18/2024 10/26/23   Hoy Register, MD    Current Outpatient Medications  Medication Sig Dispense Refill   aspirin EC 81 MG EC tablet Take 1 tablet (81 mg total) by mouth daily. 30 tablet 0   atorvastatin (LIPITOR) 80 MG tablet Take 1 tablet (80 mg total) by mouth daily. 90 tablet 1   carvedilol (COREG) 3.125 MG tablet Take 1 tablet (3.125 mg total) by mouth 2 (two) times daily with a meal. 180 tablet 1   ezetimibe (ZETIA) 10 MG tablet Take 1 tablet (10 mg total) by mouth daily. 90 tablet 1   furosemide (LASIX) 20 MG tablet Take 1 tablet (20 mg total) by mouth daily. 90 tablet 1   sacubitril-valsartan (ENTRESTO) 97-103 MG Take 1 tablet by mouth 2 (two) times daily. 180 tablet 1   spironolactone (ALDACTONE) 25 MG tablet Take 0.5 tablets (12.5 mg total) by mouth daily. 45  tablet 1   Blood Pressure Monitor DEVI Use as directed to check home blood pressure 2-3 times a week 1 Device 0   potassium chloride SA (KLOR-CON M) 20 MEQ tablet Take 1 tablet (20 mEq total) by mouth daily. (Patient not taking: Reported on 03/18/2024) 30 tablet 4   Current Facility-Administered Medications  Medication Dose Route Frequency Provider Last Rate Last Admin   0.9 %  sodium chloride infusion  500 mL Intravenous Once Napoleon Form, MD        Allergies as of 03/18/2024   (No Known Allergies)    Family History  Problem Relation Age of Onset   Alzheimer's  disease Mother    Heart failure Father    Hypertension Father    Congestive Heart Failure Father    Asthma Brother    Colon cancer Neg Hx    Rectal cancer Neg Hx    Stomach cancer Neg Hx    Esophageal cancer Neg Hx     Social History   Socioeconomic History   Marital status: Single    Spouse name: Not on file   Number of children: Not on file   Years of education: Not on file   Highest education level: Not on file  Occupational History   Not on file  Tobacco Use   Smoking status: Some Days    Current packs/day: 0.00    Average packs/day: 0.5 packs/day for 20.0 years (10.0 ttl pk-yrs)    Types: Cigarettes    Start date: 03/22/1995    Last attempt to quit: 03/22/2015    Years since quitting: 8.9   Smokeless tobacco: Never  Vaping Use   Vaping status: Never Used  Substance and Sexual Activity   Alcohol use: Yes    Comment: rarely   Drug use: No   Sexual activity: Not on file  Other Topics Concern   Not on file  Social History Narrative   Not on file   Social Drivers of Health   Financial Resource Strain: Low Risk  (10/26/2023)   Overall Financial Resource Strain (CARDIA)    Difficulty of Paying Living Expenses: Not hard at all  Food Insecurity: No Food Insecurity (10/26/2023)   Hunger Vital Sign    Worried About Running Out of Food in the Last Year: Never true    Ran Out of Food in the Last Year: Never true  Transportation Needs: No Transportation Needs (10/26/2023)   PRAPARE - Administrator, Civil Service (Medical): No    Lack of Transportation (Non-Medical): No  Physical Activity: Patient Declined (10/26/2023)   Exercise Vital Sign    Days of Exercise per Week: Patient declined    Minutes of Exercise per Session: Patient declined  Stress: No Stress Concern Present (10/26/2023)   [employer] of Occupational Health - Occupational Stress Questionnaire    Feeling of Stress : Not at all  Social Connections: Moderately Integrated (10/26/2023)    Social Connection and Isolation Panel [NHANES]    Frequency of Communication with Friends and Family: More than three times a week    Frequency of Social Gatherings with Friends and Family: Once a week    Attends Religious Services: 1 to 4 times per year    Active Member of [employer] or Organizations: Yes    Attends Banker Meetings: 1 to 4 times per year    Marital Status: Never married  Intimate Partner Violence: Not At Risk (10/26/2023)   Humiliation, Afraid, Rape, and Kick questionnaire  Fear of Current or Ex-Partner: No    Emotionally Abused: No    Physically Abused: No    Sexually Abused: No    Review of Systems:  All other review of systems negative except as mentioned in the HPI.  Physical Exam: Vital signs in last 24 hours: BP (!) 144/89   Pulse 71   Temp (!) 97.3 F (36.3 C)   Ht 5\' 5"  (1.651 m)   Wt 172 lb (78 kg)   LMP 05/11/2018 (Approximate)   SpO2 100%   BMI 28.62 kg/m  General:   Alert, NAD Lungs:  Clear .   Heart:  Regular rate and rhythm Abdomen:  Soft, nontender and nondistended. Neuro/Psych:  Alert and cooperative. Normal mood and affect. A and O x 3  Reviewed labs, radiology imaging, old records and pertinent past GI work up  Patient is appropriate for planned procedure(s) and anesthesia in an ambulatory setting   K. Scherry Ran , MD [phone]

## 2024-03-18 NOTE — Patient Instructions (Signed)

## 2024-03-18 NOTE — Progress Notes (Signed)
 Report to PACU, RN, vss, BBS= Clear.

## 2024-03-18 NOTE — Progress Notes (Signed)
 Pt's states no medical or surgical changes since previsit or office visit.

## 2024-03-22 ENCOUNTER — Telehealth: Payer: Self-pay

## 2024-03-22 LAB — SURGICAL PATHOLOGY

## 2024-03-22 NOTE — Telephone Encounter (Signed)
  Follow up Call-     03/18/2024    8:22 AM  Call back number  Post procedure Call Back phone  # [phone]  Permission to leave phone message Yes     Patient questions:  Do you have a fever, pain , or abdominal swelling? No. Pain Score  0 *  Have you tolerated food without any problems? Yes.    Have you been able to return to your normal activities? Yes.    Do you have any questions about your discharge instructions: Diet   No. Medications  No. Follow up visit  No.  Do you have questions or concerns about your Care? No.  Actions: * If pain score is 4 or above: No action needed, pain <4.

## 2024-04-21 ENCOUNTER — Ambulatory Visit: Payer: Self-pay | Admitting: Gastroenterology

## 2024-04-29 ENCOUNTER — Encounter (HOSPITAL_COMMUNITY): Admitting: Cardiology

## 2024-04-29 ENCOUNTER — Ambulatory Visit (HOSPITAL_COMMUNITY)

## 2024-05-30 ENCOUNTER — Encounter: Payer: Self-pay | Admitting: Internal Medicine

## 2024-05-30 ENCOUNTER — Ambulatory Visit: Payer: BC Managed Care – PPO | Attending: Internal Medicine | Admitting: Internal Medicine

## 2024-05-30 VITALS — BP 144/85 | HR 71 | Temp 97.7°F | Ht 65.0 in | Wt 168.0 lb

## 2024-05-30 DIAGNOSIS — I428 Other cardiomyopathies: Secondary | ICD-10-CM

## 2024-05-30 DIAGNOSIS — I10 Essential (primary) hypertension: Secondary | ICD-10-CM

## 2024-05-30 DIAGNOSIS — Z862 Personal history of diseases of the blood and blood-forming organs and certain disorders involving the immune mechanism: Secondary | ICD-10-CM

## 2024-05-30 DIAGNOSIS — I340 Nonrheumatic mitral (valve) insufficiency: Secondary | ICD-10-CM

## 2024-05-30 DIAGNOSIS — Z8673 Personal history of transient ischemic attack (TIA), and cerebral infarction without residual deficits: Secondary | ICD-10-CM | POA: Diagnosis not present

## 2024-05-30 DIAGNOSIS — E782 Mixed hyperlipidemia: Secondary | ICD-10-CM

## 2024-05-30 NOTE — Patient Instructions (Signed)
 VISIT SUMMARY:  Today, we reviewed your ongoing health conditions, including hypertension, mitral valve regurgitation, hyperlipidemia, and anemia. We discussed your current medications, recent health updates, and necessary follow-up actions.  YOUR PLAN:  -HYPERTENSION: Hypertension means high blood pressure. Your blood pressure readings were slightly elevated. It's important to take your blood pressure medications as prescribed and monitor your blood pressure at home weekly. Continue with your current medications: carvedilol , spironolactone , Entresto , and furosemide .  -MITRAL VALVE REGURGITATION: Mitral valve regurgitation is a condition where the heart's mitral valve doesn't close tightly, allowing blood to flow backward in the heart. Your condition remains moderate, and you need to reschedule your echocardiogram and follow-up appointment with Dr. Rolan.  -HYPERLIPIDEMIA: Hyperlipidemia means high levels of fats (lipids) in the blood. Your cholesterol levels have improved but your LDL is still above the target. Continue taking atorvastatin  and Zetia , and we will check your cholesterol levels to see how well Zetia  is working.  -STROKE: You are taking aspirin  daily to prevent another stroke. Continue with your current aspirin  regimen of 81 mg daily.  -ANEMIA: Anemia is a condition where you don't have enough healthy red blood cells. Your anemia is stable, and you do not need iron supplements at this time. We will monitor your blood counts as needed.  INSTRUCTIONS:  Please reschedule your echocardiogram and follow-up appointment with Dr. Rolan. Continue taking all your medications as prescribed and monitor your blood pressure at home weekly. We will check your cholesterol levels to assess the effectiveness of Zetia . Follow up with us  as needed for any concerns or changes in your health.

## 2024-05-30 NOTE — Progress Notes (Signed)
 Patient ID: Catherine Gross, female    DOB: [birth year]  MRN: [id number]  CC: Hypertension (HTN f/u. /No questions / concerns)   Subjective: Catherine Gross is a 53 y.o. female who presents for chronic ds management. Her concerns today include:  Patient with history of nonischemic cardiomyopathy with recent EF of 55 to 60%, HTN, moderate mitral regurg which has improved over the past 3 years, tobacco dependence, chronic IDA, CVA 2019, fibromuscular dysplasia RT carotid artery.   Discussed the use of AI scribe software for clinical note transcription with the patient, who gave verbal consent to proceed.  History of Present Illness Catherine Gross is a 53 year old female with hypertension, hyperlipidemia, and mitral valve regurgitation who presents for a routine follow-up.  HTN/NICM: She is on carvedilol , spironolactone , Entresto , furosemide , and aspirin  and confirms taking consistently. She has not taken her potassium supplement for about a month. Did not take meds as yet for the morning but does have her meds with her to take after she eats breakfast. Her blood pressure was last checked two weeks ago but does not recall the reading. She limits salt intake. She denies chest pain, shortness of breath, leg swelling, chronic headaches, or dizziness.  Moderate MR: Missed appt with her cardiologist Dr. Rolan and echo he had ordered due to a family emergency. Plans to reschedule. Her last echocardiogram in 2022 showed moderate mitral regurgitation.  She is taking atorvastatin  80 mg and Zetia  10 mg for hyperlipidemia. Her cholesterol levels have improved, but LDL remains above target going from 205 to 113. Zetia  was added after last visit.  She has had history of CVA.  She quit smoking three years ago and continues to abstain from cigarettes.  Her anemia corrected and was stable around 12.5 when last checked in 11/2023, and she is not on iron supplements.    Patient Active Problem List    Diagnosis Date Noted   Former smoker 11/26/2023   Fibromuscular dysplasia (HCC) 04/11/2019   Left sided lacunar stroke (HCC) 07/08/2018   Hyperlipidemia    Cerebrovascular accident (CVA) (HCC) 06/22/2018   Dilated cardiomyopathy (HCC) 04/02/2015   Systolic heart failure (HCC) 04/02/2015   Essential hypertension 04/02/2015   Normocytic anemia      Current Outpatient Medications on File Prior to Visit  Medication Sig Dispense Refill   aspirin  EC 81 MG EC tablet Take 1 tablet (81 mg total) by mouth daily. 30 tablet 0   atorvastatin  (LIPITOR ) 80 MG tablet Take 1 tablet (80 mg total) by mouth daily. 90 tablet 1   Blood Pressure Monitor DEVI Use as directed to check home blood pressure 2-3 times a week 1 Device 0   carvedilol  (COREG ) 3.125 MG tablet Take 1 tablet (3.125 mg total) by mouth 2 (two) times daily with a meal. 180 tablet 1   ezetimibe  (ZETIA ) 10 MG tablet Take 1 tablet (10 mg total) by mouth daily. 90 tablet 1   furosemide  (LASIX ) 20 MG tablet Take 1 tablet (20 mg total) by mouth daily. 90 tablet 1   potassium chloride  SA (KLOR-CON  M) 20 MEQ tablet Take 1 tablet (20 mEq total) by mouth daily. 30 tablet 4   sacubitril -valsartan  (ENTRESTO ) 97-103 MG Take 1 tablet by mouth 2 (two) times daily. 180 tablet 1   spironolactone  (ALDACTONE ) 25 MG tablet Take 0.5 tablets (12.5 mg total) by mouth daily. 45 tablet 1   No current facility-administered medications on file prior to visit.    No Known Allergies  Social History   Socioeconomic History   Marital status: Single    Spouse name: Not on file   Number of children: Not on file   Years of education: Not on file   Highest education level: Not on file  Occupational History   Not on file  Tobacco Use   Smoking status: Former    Average packs/day: 0.5 packs/day for 20.0 years (10.0 ttl pk-yrs)    Types: Cigarettes    Start date: 03/22/1995   Smokeless tobacco: Never  Vaping Use   Vaping status: Never Used  Substance and Sexual  Activity   Alcohol use: Yes    Comment: rarely   Drug use: No   Sexual activity: Not on file  Other Topics Concern   Not on file  Social History Narrative   Not on file   Social Drivers of Health   Financial Resource Strain: Low Risk  (10/26/2023)   Overall Financial Resource Strain (CARDIA)    Difficulty of Paying Living Expenses: Not hard at all  Food Insecurity: No Food Insecurity (10/26/2023)   Hunger Vital Sign    Worried About Running Out of Food in the Last Year: Never true    Ran Out of Food in the Last Year: Never true  Transportation Needs: No Transportation Needs (10/26/2023)   PRAPARE - Administrator, Civil Service (Medical): No    Lack of Transportation (Non-Medical): No  Physical Activity: Patient Declined (10/26/2023)   Exercise Vital Sign    Days of Exercise per Week: Patient declined    Minutes of Exercise per Session: Patient declined  Stress: No Stress Concern Present (10/26/2023)   [employer] of Occupational Health - Occupational Stress Questionnaire    Feeling of Stress : Not at all  Social Connections: Moderately Integrated (10/26/2023)   Social Connection and Isolation Panel    Frequency of Communication with Friends and Family: More than three times a week    Frequency of Social Gatherings with Friends and Family: Once a week    Attends Religious Services: 1 to 4 times per year    Active Member of [employer] or Organizations: Yes    Attends Banker Meetings: 1 to 4 times per year    Marital Status: Never married  Intimate Partner Violence: Not At Risk (10/26/2023)   Humiliation, Afraid, Rape, and Kick questionnaire    Fear of Current or Ex-Partner: No    Emotionally Abused: No    Physically Abused: No    Sexually Abused: No    Family History  Problem Relation Age of Onset   Alzheimer's disease Mother    Heart failure Father    Hypertension Father    Congestive Heart Failure Father    Asthma Brother    Colon cancer  Neg Hx    Rectal cancer Neg Hx    Stomach cancer Neg Hx    Esophageal cancer Neg Hx     Past Surgical History:  Procedure Laterality Date   CESAREAN SECTION     RIGHT/LEFT HEART CATH AND CORONARY ANGIOGRAPHY N/A 09/02/2017   Procedure: RIGHT/LEFT HEART CATH AND CORONARY ANGIOGRAPHY;  Surgeon: Rolan Ezra RAMAN, MD;  Location: [facility] INVASIVE [facility];  Service: Cardiovascular;  Laterality: N/A;   TEE WITHOUT CARDIOVERSION N/A 09/02/2017   Procedure: TRANSESOPHAGEAL ECHOCARDIOGRAM (TEE);  Surgeon: Rolan Ezra RAMAN, MD;  Location: [facility] ENDOSCOPY;  Service: Cardiovascular;  Laterality: N/A;    ROS: Review of Systems Negative except as stated above  PHYSICAL EXAM: BP ROLLEN)  144/85   Pulse 71   Temp 97.7 F (36.5 C) (Oral)   Ht 5' 5 (1.651 m)   Wt 168 lb (76.2 kg)   LMP 05/11/2018 (Approximate)   SpO2 99%   BMI 27.96 kg/m   Physical Exam  General appearance - alert, well appearing, and in no distress Mental status - normal mood, behavior, speech, dress, motor activity, and thought processes Chest - clear to auscultation, no wheezes, rales or rhonchi, symmetric air entry Heart - normal rate, regular rhythm, normal S1, S2, no murmurs, rubs, clicks or gallops Extremities - peripheral pulses normal, no pedal edema, no clubbing or cyanosis      Latest Ref Rng & Units 11/26/2023   11:26 AM 10/26/2023    5:00 PM 08/23/2021    8:43 AM  CMP  Glucose 70 - 99 mg/dL 95  88  856   BUN 6 - 24 mg/dL 11  9  10    Creatinine 0.57 - 1.00 mg/dL 9.16  9.15  9.14   Sodium 134 - 144 mmol/L 144  144  141   Potassium 3.5 - 5.2 mmol/L 4.4  4.4  3.6   Chloride 96 - 106 mmol/L 108  105  107   CO2 20 - 29 mmol/L 22  22  27    Calcium  8.7 - 10.2 mg/dL 9.7  9.4  9.3   Total Protein 6.0 - 8.5 g/dL  7.2    Total Bilirubin 0.0 - 1.2 mg/dL  <9.7    Alkaline Phos 44 - 121 IU/L  87    AST 0 - 40 IU/L  16    ALT 0 - 32 IU/L  9     Lipid Panel     Component Value Date/Time   CHOL 180 01/29/2024 1040   TRIG 84  01/29/2024 1040   HDL 51 01/29/2024 1040   CHOLHDL 3.5 01/29/2024 1040   CHOLHDL 3.6 05/16/2021 1231   VLDL 15 05/16/2021 1231   LDLCALC 113 (H) 01/29/2024 1040    CBC    Component Value Date/Time   WBC 5.3 11/26/2023 1126   WBC 4.3 06/22/2018 0928   RBC 4.49 11/26/2023 1126   RBC 4.04 06/22/2018 0928   HGB 12.5 11/26/2023 1126   HCT 38.3 11/26/2023 1126   PLT 235 11/26/2023 1126   MCV 85 11/26/2023 1126   MCH 27.8 11/26/2023 1126   MCH 26.7 06/22/2018 0928   MCHC 32.6 11/26/2023 1126   MCHC 31.6 06/22/2018 0928   RDW 13.5 11/26/2023 1126   LYMPHSABS 1.4 06/22/2018 0928   MONOABS 0.4 06/22/2018 0928   EOSABS 0.1 06/22/2018 0928   BASOSABS 0.0 06/22/2018 0928    ASSESSMENT AND PLAN: 1. Essential hypertension (Primary) Not at goal.  She has not taken her medicines as yet for the morning.  She will take them as soon as she eats breakfast. Continue carvedilol  3.25 mg twice a day, spironolactone  25 mg half a tablet daily, Entresto  twice a day.  We will check chemistry today to see where her potassium level lies.  If it is low, we will have her restart potassium supplement. - Comprehensive metabolic panel with GFR  2. NICM (nonischemic cardiomyopathy) (HCC) Stable and compensated.  Continue Entresto  97/103 mg twice a day, carvedilol  3.125 mg twice a day, spironolactone  25 mg half a tablet a day and furosemide  20 mg daily.  Advised to call to reschedule her follow-up with Dr. Venancio.  3. Mitral valve insufficiency, unspecified etiology Patient will call and reschedule appointment for  her echo and for follow-up with her cardiologist  4. History of CVA (cerebrovascular accident) Continue aspirin  and 81 mg of atorvastatin   5. Mixed hyperlipidemia See #4 above - Lipid panel  6. History of iron deficiency anemia Recheck iron studies today. - CBC  Assessment and Plan Assessment & Plan Hypertension Blood pressure elevated at 147/87 mmHg and 144/85 mmHg. Target is 130/80  mmHg or lower. Non-adherence to antihypertensive medications noted. - Encourage adherence to antihypertensive medications. - Advise weekly home blood pressure monitoring. - Maintain current regimen: carvedilol , spironolactone , Entresto , furosemide .  Mitral Valve Regurgitation Moderate mitral valve regurgitation. Echocardiogram pending due to family emergency. Last echocardiogram in 2022 showed moderate regurgitation. - Instruct to reschedule echocardiogram with Dr. Rolan. - Advise rescheduling appointment with Dr. Rolan.  Hyperlipidemia On atorvastatin  80 mg and Zetia  10 mg. LDL reduced from 205 mg/dL to 886 mg/dL. Goal LDL <55 mg/dL due to stroke history. - Check cholesterol levels to assess Zetia  effectiveness. - Continue atorvastatin  and Zetia .  Stroke On aspirin  81 mg daily for secondary prevention. Compliant with regimen. - Continue aspirin  81 mg daily.  Anemia Anemia with well-managed blood counts. No longer on iron supplementation. - Monitor blood counts as needed.     There are no diagnoses linked to this encounter.   Patient was given the opportunity to ask questions.  Patient verbalized understanding of the plan and was able to repeat key elements of the plan.   This documentation was completed using Paediatric nurse.  Any transcriptional errors are unintentional.  Orders Placed This Encounter  Procedures   Lipid panel   Comprehensive metabolic panel with GFR   CBC     Requested Prescriptions    No prescriptions requested or ordered in this encounter    Return in about 4 months (around 09/29/2024).  Barnie Louder, MD, FACP

## 2024-05-31 ENCOUNTER — Ambulatory Visit: Payer: Self-pay | Admitting: Internal Medicine

## 2024-05-31 LAB — CBC
Hematocrit: 36.8 % (ref 34.0–46.6)
Hemoglobin: 11.5 g/dL (ref 11.1–15.9)
MCH: 27.9 pg (ref 26.6–33.0)
MCHC: 31.3 g/dL — ABNORMAL LOW (ref 31.5–35.7)
MCV: 89 fL (ref 79–97)
Platelets: 238 10*3/uL (ref 150–450)
RBC: 4.12 x10E6/uL (ref 3.77–5.28)
RDW: 13.7 % (ref 11.7–15.4)
WBC: 5.1 10*3/uL (ref 3.4–10.8)

## 2024-05-31 LAB — COMPREHENSIVE METABOLIC PANEL WITH GFR
ALT: 10 IU/L (ref 0–32)
AST: 11 IU/L (ref 0–40)
Albumin: 4 g/dL (ref 3.8–4.9)
Alkaline Phosphatase: 83 IU/L (ref 44–121)
BUN/Creatinine Ratio: 13 (ref 9–23)
BUN: 10 mg/dL (ref 6–24)
Bilirubin Total: <0.2 mg/dL (ref 0.0–1.2)
CO2: 22 mmol/L (ref 20–29)
Calcium: 9.6 mg/dL (ref 8.7–10.2)
Chloride: 106 mmol/L (ref 96–106)
Creatinine, Ser: 0.8 mg/dL (ref 0.57–1.00)
Globulin, Total: 3 g/dL (ref 1.5–4.5)
Glucose: 99 mg/dL (ref 70–99)
Potassium: 4.3 mmol/L (ref 3.5–5.2)
Sodium: 144 mmol/L (ref 134–144)
Total Protein: 7 g/dL (ref 6.0–8.5)
eGFR: 89 mL/min/{1.73_m2} (ref >59)

## 2024-05-31 LAB — LIPID PANEL
Chol/HDL Ratio: 3.6 ratio (ref 0.0–4.4)
Cholesterol, Total: 152 mg/dL (ref 100–199)
HDL: 42 mg/dL (ref >39)
LDL Chol Calc (NIH): 92 mg/dL (ref 0–99)
Triglycerides: 95 mg/dL (ref 0–149)
VLDL Cholesterol Cal: 18 mg/dL (ref 5–40)

## 2024-08-24 ENCOUNTER — Ambulatory Visit (HOSPITAL_COMMUNITY): Payer: Self-pay | Admitting: Cardiology

## 2024-08-24 ENCOUNTER — Ambulatory Visit (HOSPITAL_COMMUNITY)
Admission: RE | Admit: 2024-08-24 | Discharge: 2024-08-24 | Disposition: A | Discharge status: Home / Self Care | Source: Ambulatory Visit | Attending: Cardiology | Admitting: Cardiology

## 2024-08-24 VITALS — BP 152/88 | HR 76 | Wt 161.0 lb

## 2024-08-24 DIAGNOSIS — I773 Arterial fibromuscular dysplasia: Secondary | ICD-10-CM | POA: Diagnosis not present

## 2024-08-24 DIAGNOSIS — Z87891 Personal history of nicotine dependence: Secondary | ICD-10-CM | POA: Diagnosis not present

## 2024-08-24 DIAGNOSIS — Z7982 Long term (current) use of aspirin: Secondary | ICD-10-CM | POA: Insufficient documentation

## 2024-08-24 DIAGNOSIS — I10 Essential (primary) hypertension: Secondary | ICD-10-CM | POA: Diagnosis not present

## 2024-08-24 DIAGNOSIS — I159 Secondary hypertension, unspecified: Secondary | ICD-10-CM | POA: Diagnosis not present

## 2024-08-24 DIAGNOSIS — I110 Hypertensive heart disease with heart failure: Secondary | ICD-10-CM | POA: Diagnosis present

## 2024-08-24 DIAGNOSIS — I340 Nonrheumatic mitral (valve) insufficiency: Secondary | ICD-10-CM | POA: Diagnosis not present

## 2024-08-24 DIAGNOSIS — I429 Cardiomyopathy, unspecified: Secondary | ICD-10-CM | POA: Diagnosis not present

## 2024-08-24 DIAGNOSIS — I420 Dilated cardiomyopathy: Secondary | ICD-10-CM

## 2024-08-24 DIAGNOSIS — Z8673 Personal history of transient ischemic attack (TIA), and cerebral infarction without residual deficits: Secondary | ICD-10-CM | POA: Diagnosis not present

## 2024-08-24 DIAGNOSIS — I428 Other cardiomyopathies: Secondary | ICD-10-CM | POA: Diagnosis not present

## 2024-08-24 DIAGNOSIS — Z79899 Other long term (current) drug therapy: Secondary | ICD-10-CM | POA: Diagnosis not present

## 2024-08-24 DIAGNOSIS — I5022 Chronic systolic (congestive) heart failure: Secondary | ICD-10-CM | POA: Insufficient documentation

## 2024-08-24 LAB — BASIC METABOLIC PANEL WITH GFR
Anion gap: 10 (ref 5–15)
BUN: 9 mg/dL (ref 6–20)
CO2: 28 mmol/L (ref 22–32)
Calcium: 9.6 mg/dL (ref 8.9–10.3)
Chloride: 106 mmol/L (ref 98–111)
Creatinine, Ser: 0.81 mg/dL (ref 0.44–1.00)
GFR, Estimated: >60 mL/min (ref >60)
Glucose, Bld: 102 mg/dL — ABNORMAL HIGH (ref 70–99)
Potassium: 4.2 mmol/L (ref 3.5–5.1)
Sodium: 144 mmol/L (ref 135–145)

## 2024-08-24 MED ORDER — EZETIMIBE 10 MG PO TABS: 10.0000 mg | ORAL_TABLET | Freq: Every day | ORAL | 3 refills | Status: AC

## 2024-08-24 MED ORDER — CARVEDILOL 6.25 MG PO TABS: 6.2500 mg | ORAL_TABLET | Freq: Two times a day (BID) | ORAL | 3 refills | Status: AC

## 2024-08-24 NOTE — Progress Notes (Signed)
 Advanced Heart Failure Clinic Note   PCP: Vicci Barnie NOVAK, MD HF Cardiology: Dr. Rolan  Chief complaint: CHF  53 y.o. with history of cardiomyopathy of uncertain etiology, HTN, and severe mitral regurgitation returns for followup of CHF and MR.  She has had a history of HTN for about 5 years or so.  She had pre-eclampsia with a pregnancy.  In 2016, she had an echo showing EF 35% with diffuse hypokinesis and severe MR.  Workup for secondary hypertension at that time showed elevated urinary metanephrines.  There was concern for pheochromocytoma, but it appears that this was not followed up at the time.  She had a repeat echo in 2/18 with EF 35-40% and again, severe MR.  Cardiac MRI in 7/18 showed EF 44%, severe MR, and no myocardial late gadolinium enhancement.  She had repeat urinary metanephrines => this actually was normal (8/18), serum metanephrines in 9/18 were also normal.   She had right and left heart cath in 9/18, showing no significant CAD and filling pressures were optimized.  TEE in 9/18 showed improvement in EF to 50% with moderate MR by PISA and vena contracta, moderate-severe visually.  Echo in 7/19 showed EF 55-60%, mild to moderate AI, moderate MR.   In 7/19, she was admitted with acute lacunar infarct.  CTA neck showed pattern in the carotid arteries that was concerning for fibromuscular dysplasia. Renal artery dopplers in 10/19 did not show evidence for renal artery stenosis.   Echo in 10/20 showed EF 50-55%, mild LVH, normal RV size and systolic function, moderate MR, mild AI.   She presents today for followup of CHF and HTN.  BP is elevated today but has been ok at other recent doctor's visits.  She is taking all her meds but is now on a lower dose of Coreg . She has not been using Lasix .  No significant exertional dyspnea or chest pain.  No palpitations or lightheadedness.  She works full time at an [employer].  Weight down 1 lb from prior appt.   Labs (6/25): LDL 92, K 4.3,  creatinine 0.8, hgb 11.5  PMH: 1. HTN: Evaluation in 2016 was concerning for pheochromocytoma with elevated urinary metanephrines but no followup.  8/18 24 hour urine collection actually showed normal urinary metanephrines.  - Renal artery dopplers in 10/19 with no evidence for renal artery stenosis.  2. Hyperlipidemia 3. Chronic systolic CHF: Nonischemic cardiomyopathy - Echo (4/16): EF 35%, mild LV dilation, mild LVH, mild AI, PASP 49 mmHg, severe MR.  - Echo (2/18): EF 35-40%, moderate LV dilation, diffuse hypokinesis, moderate AI, severe MR, normal RV size and systolic function, moderate TR, moderate PI.  - Cardiac MRI (7/18): EF 44% with mild LV dilation, mild LVH, severe central MR, no late gadolinium enhancement noted.  - LHC/RHC (9/18): No significant CAD; mean RA 6, PA 28/12, mean PCWP 10, 3+ MR.  - TEE (9/18): TEE 50%, mild LV dilation, visually moderate to severe MR but moderate by PISA (ERO 0.34) and vena contracta (0.55 cm), normal RV size and systolic function, peak RV-RA gradient 46 mmHg.  - Echo (7/19): EF 55-60%, mild LV dilation, no AS, mild-moderate AI, moderate MR, normal RV, severe LAE.  - Echo (10/20):  EF 50-55%, mild LVH, normal RV size and systolic function, moderate MR, mild AI. 4. Mitral regurgitation: Severe by echo and cardiac MRI, appears to be central.  - TEE 9/18 with probably moderate MR (by PISA and vena contracta). MR central, likely functional. - Echo (7/19):  Moderate MR.   - Echo (10/20): Moderate MR.  5. Lacunar CVA: 7/19.  6. Fibromuscular dysplasia: CTA head/neck 7/19 showed pattern in the carotid arteries concerning for FMD.  - Renal artery dopplers (10/19): no evidence for renal artery stenosis or mesenteric artery stenosis.  - Carotid dopplers (10/21) with mild turbulent flow from fibromuscular dysplasia without significant stenosis.   Social History   Socioeconomic History   Marital status: Single    Spouse name: Not on file   Number of  children: Not on file   Years of education: Not on file   Highest education level: Not on file  Occupational History   Not on file  Tobacco Use   Smoking status: Former    Average packs/day: 0.5 packs/day for 20.0 years (10.0 ttl pk-yrs)    Types: Cigarettes    Start date: 03/22/1995   Smokeless tobacco: Never  Vaping Use   Vaping status: Never Used  Substance and Sexual Activity   Alcohol use: Yes    Comment: rarely   Drug use: No   Sexual activity: Not on file  Other Topics Concern   Not on file  Social History Narrative   Not on file   Social Drivers of Health   Financial Resource Strain: Low Risk  (10/26/2023)   Overall Financial Resource Strain (CARDIA)    Difficulty of Paying Living Expenses: Not hard at all  Food Insecurity: No Food Insecurity (10/26/2023)   Hunger Vital Sign    Worried About Running Out of Food in the Last Year: Never true    Ran Out of Food in the Last Year: Never true  Transportation Needs: No Transportation Needs (10/26/2023)   PRAPARE - Administrator, Civil Service (Medical): No    Lack of Transportation (Non-Medical): No  Physical Activity: Patient Declined (10/26/2023)   Exercise Vital Sign    Days of Exercise per Week: Patient declined    Minutes of Exercise per Session: Patient declined  Stress: No Stress Concern Present (10/26/2023)   [employer] of Occupational Health - Occupational Stress Questionnaire    Feeling of Stress : Not at all  Social Connections: Moderately Integrated (10/26/2023)   Social Connection and Isolation Panel    Frequency of Communication with Friends and Family: More than three times a week    Frequency of Social Gatherings with Friends and Family: Once a week    Attends Religious Services: 1 to 4 times per year    Active Member of [employer] or Organizations: Yes    Attends Banker Meetings: 1 to 4 times per year    Marital Status: Never married  Intimate Partner Violence: Not At  Risk (10/26/2023)   Humiliation, Afraid, Rape, and Kick questionnaire    Fear of Current or Ex-Partner: No    Emotionally Abused: No    Physically Abused: No    Sexually Abused: No   Family History  Problem Relation Age of Onset   Alzheimer's disease Mother    Heart failure Father    Hypertension Father    Congestive Heart Failure Father    Asthma Brother    Colon cancer Neg Hx    Rectal cancer Neg Hx    Stomach cancer Neg Hx    Esophageal cancer Neg Hx    Review of systems complete and found to be negative unless listed in HPI.    Current Outpatient Medications  Medication Sig Dispense Refill   aspirin  EC 81 MG EC tablet Take 1  tablet (81 mg total) by mouth daily. 30 tablet 0   atorvastatin  (LIPITOR ) 80 MG tablet Take 1 tablet (80 mg total) by mouth daily. 90 tablet 1   furosemide  (LASIX ) 20 MG tablet Take 1 tablet (20 mg total) by mouth daily. 90 tablet 1   potassium chloride  SA (KLOR-CON  M) 20 MEQ tablet Take 1 tablet (20 mEq total) by mouth daily. 30 tablet 4   sacubitril -valsartan  (ENTRESTO ) 97-103 MG Take 1 tablet by mouth 2 (two) times daily. 180 tablet 1   spironolactone  (ALDACTONE ) 25 MG tablet Take 0.5 tablets (12.5 mg total) by mouth daily. 45 tablet 1   Blood Pressure Monitor DEVI Use as directed to check home blood pressure 2-3 times a week (Patient not taking: Reported on 08/24/2024) 1 Device 0   carvedilol  (COREG ) 6.25 MG tablet Take 1 tablet (6.25 mg total) by mouth 2 (two) times daily with a meal. 180 tablet 3   ezetimibe  (ZETIA ) 10 MG tablet Take 1 tablet (10 mg total) by mouth daily. 90 tablet 3   No current facility-administered medications for this encounter.   Vitals:   08/24/24 0827  BP: (!) 152/88  Pulse: 76  SpO2: 98%  Weight: 73 kg (161 lb)   Wt Readings from Last 3 Encounters:  08/24/24 73 kg (161 lb)  05/30/24 76.2 kg (168 lb)  03/18/24 78 kg (172 lb)    General: NAD Neck: No JVD, no thyromegaly or thyroid nodule.  Lungs: Clear to  auscultation bilaterally with normal respiratory effort. CV: Nondisplaced PMI.  Heart regular S1/S2, no S3/S4, no murmur.  No peripheral edema.  No carotid bruit.  Normal pedal pulses.  Abdomen: Soft, nontender, no hepatosplenomegaly, no distention.  Skin: Intact without lesions or rashes.  Neurologic: Alert and oriented x 3.  Psych: Normal affect. Extremities: No clubbing or cyanosis.  HEENT: Normal.   Assessment/Plan: 1. HTN:  Long standing.  She had carotid artery pattern that was consistent with FMD, but renal artery dopplers in 10/19 were not suggestive of renal artery stenosis.  Repeat workup for pheochromocytoma was negative. BP is elevated today.  - Increase Coreg  to 6.25 mg bid.  - Continue Entresto  97/103 bid.  - Continue spironolactone  25 mg daily.  - I will arrange for repeat renal artery dopplers to makes sure that there has been no progression of fibromuscular dysplasia with renal artery stenosis.  2. Chronic systolic CHF: Nonischemic cardiomyopathy (no significant CAD on 9/18 cath).  EF 35-40% by 2/18 echo and 44% by 7/18 cMRI. No late gadolinium enhancement on MRI. Etiology uncertain.  It is possible that she could have a cardiomyopathy due to long-standing severe MR (was present on initial echo in 4/16), but evaluation has not appeared to show structural problems with the MV so MR may be functional and and related to the cardiomyopathy. Possible hypertensive cardiomyopathy.  EF improved to 50% on 9/18 TEE, 55-60% on 7/19 echo, 50-55% on 10/20 echo.  NYHA class I symptoms.  Not volume overloaded on exam.  - Increase Coreg  to 6.25 mg bid as above.  - Continue Entresto  97/103 bid.   - Continue spironolactone  25 mg daily.  - I will arrange for repeat echo to reassess LV EF.  3. Mitral regurgitation: Severe MR noted on imaging back to 2016.  TEE 9/18 with probably moderate MR in the setting of better BP control => moderate by PISA and vena contracta.  The MV appeared structurally  normal with no prolapse so suspect functional MR likely due to  cardiomyopathy.  Most recent echo in 10/20 with moderate MR, I do not hear a murmur today.    - Repeat echo.  4. Lacunar infarct in 7/19:  Most likely related to HTN.  5. Possible fibromuscular dysplasia: Appearance of carotids on CTA was highly suggesitive of FMD. CT chest/abdomen/pelvis in 10/19 for screening purposes did not show aneurysm or dissection. Renal artery dopplers were not suggestive of renal artery stenosis or FMD involvement.  Carotid dopplers in 10/21 showed mild turbulent flow from FMD.  - Repeat carotid dopplers    Followup 6 months with APP  I spent 32 minutes reviewing records, interviewing/examining patient, and managing orders.   Ezra Shuck 08/24/2024

## 2024-08-24 NOTE — Patient Instructions (Signed)
 INCREASE Carvedilol  to 6.25 mg Twice daily  Labs done today, your results will be available in MyChart, we will contact you for abnormal readings.  Your physician has requested that you have an echocardiogram. Echocardiography is a painless test that uses sound waves to create images of your heart. It provides your doctor with information about the size and shape of your heart and how well your heart's chambers and valves are working. This procedure takes approximately one hour. There are no restrictions for this procedure. Please do NOT wear cologne, perfume, aftershave, or lotions (deodorant is allowed). Please arrive 15 minutes prior to your appointment time.  Please note: We ask at that you not bring children with you during ultrasound (echo/ vascular) testing. Due to room size and safety concerns, children are not allowed in the ultrasound rooms during exams. Our front office staff cannot provide observation of children in our lobby area while testing is being conducted. An adult accompanying a patient to their appointment will only be allowed in the ultrasound room at the discretion of the ultrasound technician under special circumstances. We apologize for any inconvenience.  Your provider has ordered carotid dopplers. You will be called to have this test arranged.  Your provider has ordered renal artery dopplers. You will be called to have this test arranged.  Your physician recommends that you schedule a follow-up appointment in: 6 months.  If you have any questions or concerns before your next appointment please send us  a message through [facility] or call our office at [phone].    TO LEAVE A MESSAGE FOR THE NURSE SELECT OPTION 2, PLEASE LEAVE A MESSAGE INCLUDING: YOUR NAME DATE OF BIRTH CALL BACK NUMBER REASON FOR CALL**this is important as we prioritize the call backs  YOU WILL RECEIVE A CALL BACK THE SAME DAY AS LONG AS YOU CALL BEFORE 4:00 PM  At the [facility], you and your health needs are our priority. As part of our continuing mission to provide you with exceptional heart care, we have created designated Provider Care Teams. These Care Teams include your primary Cardiologist (physician) and Advanced Practice Providers (APPs- Physician Assistants and Nurse Practitioners) who all work together to provide you with the care you need, when you need it.   You may see any of the following providers on your designated Care Team at your next follow up: Dr Toribio Fuel Dr Ezra Shuck Dr. Ria Commander Dr. Morene Brownie Amy Lenetta, NP Caffie Shed, [place] [facility] [place], [place] Beckey Coe, NP [place] Lee, NP Ellouise Class, NP Tinnie Redman, PharmD Jaun Bash, PharmD   Please be sure to bring in all your medications bottles to every appointment.    Thank you for choosing [facility]

## 2024-09-02 ENCOUNTER — Ambulatory Visit (HOSPITAL_COMMUNITY)
Admission: RE | Admit: 2024-09-02 | Discharge: 2024-09-02 | Disposition: A | Discharge status: Home / Self Care | Source: Ambulatory Visit | Attending: Cardiology | Admitting: Cardiology

## 2024-09-02 DIAGNOSIS — I080 Rheumatic disorders of both mitral and aortic valves: Secondary | ICD-10-CM | POA: Diagnosis not present

## 2024-09-02 DIAGNOSIS — I110 Hypertensive heart disease with heart failure: Secondary | ICD-10-CM | POA: Diagnosis not present

## 2024-09-02 DIAGNOSIS — I5022 Chronic systolic (congestive) heart failure: Secondary | ICD-10-CM | POA: Insufficient documentation

## 2024-09-02 LAB — ECHOCARDIOGRAM COMPLETE
AR max vel: 1.9 cm2
AV Area VTI: 1.69 cm2
AV Area mean vel: 1.73 cm2
AV Mean grad: 6 mmHg
AV Peak grad: 9.7 mmHg
Ao pk vel: 1.56 m/s
Area-P 1/2: 3.27 cm2
Calc EF: 51.6 %
MV VTI: 1.56 cm2
S' Lateral: 4.41 cm
Single Plane A2C EF: 52.6 %
Single Plane A4C EF: 54.1 %

## 2024-09-04 ENCOUNTER — Ambulatory Visit (HOSPITAL_COMMUNITY): Payer: Self-pay | Admitting: Cardiology

## 2024-09-14 ENCOUNTER — Ambulatory Visit (HOSPITAL_BASED_OUTPATIENT_CLINIC_OR_DEPARTMENT_OTHER)
Admission: RE | Admit: 2024-09-14 | Discharge: 2024-09-14 | Disposition: A | Discharge status: Home / Self Care | Source: Ambulatory Visit | Attending: Cardiology | Admitting: Cardiology

## 2024-09-14 ENCOUNTER — Ambulatory Visit (HOSPITAL_COMMUNITY)
Admission: RE | Admit: 2024-09-14 | Discharge: 2024-09-14 | Disposition: A | Discharge status: Home / Self Care | Source: Ambulatory Visit | Attending: Cardiology | Admitting: Cardiology

## 2024-09-14 DIAGNOSIS — I773 Arterial fibromuscular dysplasia: Secondary | ICD-10-CM

## 2024-09-14 DIAGNOSIS — I10 Essential (primary) hypertension: Secondary | ICD-10-CM | POA: Diagnosis not present

## 2024-09-14 DIAGNOSIS — I5022 Chronic systolic (congestive) heart failure: Secondary | ICD-10-CM

## 2024-09-29 ENCOUNTER — Encounter: Payer: Self-pay | Admitting: Internal Medicine

## 2024-09-29 ENCOUNTER — Ambulatory Visit: Attending: Internal Medicine | Admitting: Internal Medicine

## 2024-09-29 VITALS — BP 115/73 | HR 78 | Temp 97.9°F | Ht 65.0 in | Wt 162.0 lb

## 2024-09-29 DIAGNOSIS — I110 Hypertensive heart disease with heart failure: Secondary | ICD-10-CM | POA: Diagnosis not present

## 2024-09-29 DIAGNOSIS — D509 Iron deficiency anemia, unspecified: Secondary | ICD-10-CM

## 2024-09-29 DIAGNOSIS — E782 Mixed hyperlipidemia: Secondary | ICD-10-CM

## 2024-09-29 DIAGNOSIS — I5022 Chronic systolic (congestive) heart failure: Secondary | ICD-10-CM | POA: Diagnosis not present

## 2024-09-29 DIAGNOSIS — I340 Nonrheumatic mitral (valve) insufficiency: Secondary | ICD-10-CM | POA: Diagnosis not present

## 2024-09-29 DIAGNOSIS — F17200 Nicotine dependence, unspecified, uncomplicated: Secondary | ICD-10-CM

## 2024-09-29 DIAGNOSIS — Z2821 Immunization not carried out because of patient refusal: Secondary | ICD-10-CM

## 2024-09-29 DIAGNOSIS — I10 Essential (primary) hypertension: Secondary | ICD-10-CM

## 2024-09-29 DIAGNOSIS — F17210 Nicotine dependence, cigarettes, uncomplicated: Secondary | ICD-10-CM

## 2024-09-29 MED ORDER — SPIRONOLACTONE 25 MG PO TABS: 12.5000 mg | ORAL_TABLET | Freq: Every day | ORAL | 1 refills | Status: AC

## 2024-09-29 MED ORDER — ATORVASTATIN CALCIUM 80 MG PO TABS
80.0000 mg | ORAL_TABLET | Freq: Every day | ORAL | 1 refills | Status: AC
Start: 2024-09-29 — End: ?

## 2024-09-29 MED ORDER — VARENICLINE TARTRATE (STARTER) 0.5 MG X 11 & 1 MG X 42 PO TBPK: ORAL_TABLET | ORAL | 0 refills | Status: AC

## 2024-09-29 MED ORDER — VARENICLINE TARTRATE 1 MG PO TABS: 1.0000 mg | ORAL_TABLET | Freq: Two times a day (BID) | ORAL | 1 refills | Status: AC

## 2024-09-29 MED ORDER — SACUBITRIL-VALSARTAN 97-103 MG PO TABS: 1.0000 | ORAL_TABLET | Freq: Two times a day (BID) | ORAL | 1 refills | Status: AC

## 2024-09-29 MED ORDER — FUROSEMIDE 20 MG PO TABS
20.0000 mg | ORAL_TABLET | Freq: Every day | ORAL | 1 refills | Status: AC
Start: 2024-09-29 — End: ?

## 2024-09-29 NOTE — Progress Notes (Signed)
 Patient ID: Catherine Gross, female    DOB: [birth year]  MRN: [id number]  CC: Hypertension (HTN f/u. /No questions / concerns/No to all vax)   Subjective: Catherine Gross is a 53 y.o. female who presents for chronic ds management. Her concerns today include:  Patient with history of nonischemic cardiomyopathy with recent EF of 55 to 60% declined to 45-50% 09/2024, HTN, moderate mitral regurg which has improved over the past 3 years, tobacco dependence, chronic IDA, CVA 2019, fibromuscular dysplasia RT carotid artery.   Discussed the use of AI scribe software for clinical note transcription with the patient, who gave verbal consent to proceed.  History of Present Illness Catherine Gross is a 53 year old female with hypertension, congestive heart failure, hyperlipidemia, and anemia who presents for a four-month follow-up.  She had a follow-up with her cardiologist last month where a repeat echocardiogram showed a decrease in heart function from 55-60% to 45-50%. The mitral valve regurgitation remains stable. She is currently taking carvedilol  6.25 mg twice daily, spironolactone  half a tablet daily, Entresto  97/103 mg mostly twice daily (but admits she sometimes forget the 2nd dose or falls asleep before taking), and furosemide  every other day instead of daily as prescribed. No increased shortness of breath, chest pain, or leg swelling. She does not regularly check her blood pressure at home.  HL: For hyperlipidemia, she is on atorvastatin  80 mg daily and Zetia  10 mg daily, which she takes at night. Her LDL cholesterol improved from 113 to 92 since June. She has a history of stroke.  Anemia: Regarding anemia, she has a history of iron deficiency anemia. Her hemoglobin was 11.5 in June, down from 12.5. She takes an iron supplement twice a week. No fatigue or dizziness and has been postmenopausal for several years.  Tob: she smokes three to four cigarettes a month, mostly when drinking  socially, and is attempting to quit. She has not found nicotine  patches effective.    Patient Active Problem List   Diagnosis Date Noted   Former smoker 11/26/2023   Fibromuscular dysplasia 04/11/2019   Left sided lacunar stroke (HCC) 07/08/2018   Hyperlipidemia    Cerebrovascular accident (CVA) (HCC) 06/22/2018   Dilated cardiomyopathy (HCC) 04/02/2015   Systolic heart failure (HCC) 04/02/2015   Essential hypertension 04/02/2015   Normocytic anemia      Current Outpatient Medications on File Prior to Visit  Medication Sig Dispense Refill   aspirin  EC 81 MG EC tablet Take 1 tablet (81 mg total) by mouth daily. 30 tablet 0   Blood Pressure Monitor DEVI Use as directed to check home blood pressure 2-3 times a week 1 Device 0   carvedilol  (COREG ) 6.25 MG tablet Take 1 tablet (6.25 mg total) by mouth 2 (two) times daily with a meal. 180 tablet 3   ezetimibe  (ZETIA ) 10 MG tablet Take 1 tablet (10 mg total) by mouth daily. 90 tablet 3   No current facility-administered medications on file prior to visit.    No Known Allergies  Social History   Socioeconomic History   Marital status: Single    Spouse name: Not on file   Number of children: Not on file   Years of education: Not on file   Highest education level: Not on file  Occupational History   Not on file  Tobacco Use   Smoking status: Former    Average packs/day: 0.5 packs/day for 20.0 years (10.0 ttl pk-yrs)    Types: Cigarettes  Start date: 03/22/1995   Smokeless tobacco: Never  Vaping Use   Vaping status: Never Used  Substance and Sexual Activity   Alcohol use: Yes    Comment: rarely   Drug use: No   Sexual activity: Not on file  Other Topics Concern   Not on file  Social History Narrative   Not on file   Social Drivers of Health   Financial Resource Strain: Low Risk  (10/26/2023)   Overall Financial Resource Strain (CARDIA)    Difficulty of Paying Living Expenses: Not hard at all  Food Insecurity: No  Food Insecurity (10/26/2023)   Hunger Vital Sign    Worried About Running Out of Food in the Last Year: Never true    Ran Out of Food in the Last Year: Never true  Transportation Needs: No Transportation Needs (10/26/2023)   PRAPARE - Administrator, Civil Service (Medical): No    Lack of Transportation (Non-Medical): No  Physical Activity: Patient Declined (10/26/2023)   Exercise Vital Sign    Days of Exercise per Week: Patient declined    Minutes of Exercise per Session: Patient declined  Stress: No Stress Concern Present (10/26/2023)   [employer] of Occupational Health - Occupational Stress Questionnaire    Feeling of Stress : Not at all  Social Connections: Moderately Integrated (10/26/2023)   Social Connection and Isolation Panel    Frequency of Communication with Friends and Family: More than three times a week    Frequency of Social Gatherings with Friends and Family: Once a week    Attends Religious Services: 1 to 4 times per year    Active Member of [employer] or Organizations: Yes    Attends Banker Meetings: 1 to 4 times per year    Marital Status: Never married  Intimate Partner Violence: Not At Risk (10/26/2023)   Humiliation, Afraid, Rape, and Kick questionnaire    Fear of Current or Ex-Partner: No    Emotionally Abused: No    Physically Abused: No    Sexually Abused: No    Family History  Problem Relation Age of Onset   Alzheimer's disease Mother    Heart failure Father    Hypertension Father    Congestive Heart Failure Father    Asthma Brother    Colon cancer Neg Hx    Rectal cancer Neg Hx    Stomach cancer Neg Hx    Esophageal cancer Neg Hx     Past Surgical History:  Procedure Laterality Date   CESAREAN SECTION     RIGHT/LEFT HEART CATH AND CORONARY ANGIOGRAPHY N/A 09/02/2017   Procedure: RIGHT/LEFT HEART CATH AND CORONARY ANGIOGRAPHY;  Surgeon: Rolan Ezra RAMAN, MD;  Location: [facility] INVASIVE CV LAB;  Service: Cardiovascular;   Laterality: N/A;   TEE WITHOUT CARDIOVERSION N/A 09/02/2017   Procedure: TRANSESOPHAGEAL ECHOCARDIOGRAM (TEE);  Surgeon: Rolan Ezra RAMAN, MD;  Location: [facility] ENDOSCOPY;  Service: Cardiovascular;  Laterality: N/A;    ROS: Review of Systems Negative except as stated above  PHYSICAL EXAM: BP 115/73 (BP Location: Left Arm, Patient Position: Sitting, Cuff Size: Normal)   Pulse 78   Temp 97.9 F (36.6 C) (Oral)   Ht 5' 5 (1.651 m)   Wt 162 lb (73.5 kg)   LMP 05/11/2018 (Approximate)   SpO2 99%   BMI 26.96 kg/m   Physical Exam  General appearance - alert, well appearing, middle age AAF and in no distress Mental status - normal mood, behavior, speech, dress, motor activity, and  thought processes Neck - supple, no significant adenopathy Chest - clear to auscultation, no wheezes, rales or rhonchi, symmetric air entry Heart - normal rate, regular rhythm, normal S1, S2, no murmurs, rubs, clicks or gallops Extremities - peripheral pulses normal, no pedal edema, no clubbing or cyanosis      Latest Ref Rng & Units 08/24/2024    8:49 AM 05/30/2024    9:09 AM 11/26/2023   11:26 AM  CMP  Glucose 70 - 99 mg/dL 897  99  95   BUN 6 - 20 mg/dL 9  10  11    Creatinine 0.44 - 1.00 mg/dL 9.18  9.19  9.16   Sodium 135 - 145 mmol/L 144  144  144   Potassium 3.5 - 5.1 mmol/L 4.2  4.3  4.4   Chloride 98 - 111 mmol/L 106  106  108   CO2 22 - 32 mmol/L 28  22  22    Calcium  8.9 - 10.3 mg/dL 9.6  9.6  9.7   Total Protein 6.0 - 8.5 g/dL  7.0    Total Bilirubin 0.0 - 1.2 mg/dL  <9.7    Alkaline Phos 44 - 121 IU/L  83    AST 0 - 40 IU/L  11    ALT 0 - 32 IU/L  10     Lipid Panel     Component Value Date/Time   CHOL 152 05/30/2024 0909   TRIG 95 05/30/2024 0909   HDL 42 05/30/2024 0909   CHOLHDL 3.6 05/30/2024 0909   CHOLHDL 3.6 05/16/2021 1231   VLDL 15 05/16/2021 1231   LDLCALC 92 05/30/2024 0909    CBC    Component Value Date/Time   WBC 5.1 05/30/2024 0909   WBC 4.3 06/22/2018 0928   RBC  4.12 05/30/2024 0909   RBC 4.04 06/22/2018 0928   HGB 11.5 05/30/2024 0909   HCT 36.8 05/30/2024 0909   PLT 238 05/30/2024 0909   MCV 89 05/30/2024 0909   MCH 27.9 05/30/2024 0909   MCH 26.7 06/22/2018 0928   MCHC 31.3 (L) 05/30/2024 0909   MCHC 31.6 06/22/2018 0928   RDW 13.7 05/30/2024 0909   LYMPHSABS 1.4 06/22/2018 0928   MONOABS 0.4 06/22/2018 0928   EOSABS 0.1 06/22/2018 0928   BASOSABS 0.0 06/22/2018 0928    ASSESSMENT AND PLAN: 1. Essential hypertension (Primary) At goal.  Continue carvedilol  6.25 mg twice a day, spironolactone  25 mg 1/2 tab daily, Entresto  97/103 mg twice a day. Okay to take furosemide  every other day.  However if she develops any shortness of breath or swelling in the legs, she should take it every day until symptoms resolve. Stop potassium supplement since she has not been taking it and recent potassium level was normal.  2. Chronic systolic heart failure (HCC) Stable and compensated even though most recent echo showed a slight decline in heart function.  Continue carvedilol , spironolactone , Entresto  and furosemide   3. Nonrheumatic mitral valve regurgitation Stable and asymptomatic at this time.  Followed by cardiology.  4. Mixed hyperlipidemia LDL improved.  Continue atorvastatin  80 mg daily and Zetia  10 mg daily  5. Iron deficiency anemia, unspecified iron deficiency anemia type Try to take iron supplement at least 3x/wk  6. Tobacco dependence Pt is current smoker. Patient advised to quit smoking. Discussed health risks associated with smoking including lung and other types of cancers, chronic lung diseases and CV risks.. Pt ready to give trail of quitting.   Discussed methods to help quit including quitting cold  [place], use of NRT, Chantix and Bupropion.  Pt wanting to try: Chantix.  Advised that the medication works well but can cause mood swings and bad dreams.  Will do the starter pack first then the continuation pack. _2_ Minutes spent on  counseling. F/U:  4 mths   7. Influenza vaccination declined Recommended. Pt declined  8. Pneumococcal vaccination declined Recommended. Pt declined  9. Declined hepatitis B immunization    Patient was given the opportunity to ask questions.  Patient verbalized understanding of the plan and was able to repeat key elements of the plan.   This documentation was completed using Paediatric nurse.  Any transcriptional errors are unintentional.  No orders of the defined types were placed in this encounter.    Requested Prescriptions   Signed Prescriptions Disp Refills   atorvastatin  (LIPITOR ) 80 MG tablet 90 tablet 1    Sig: Take 1 tablet (80 mg total) by mouth daily.   furosemide  (LASIX ) 20 MG tablet 90 tablet 1    Sig: Take 1 tablet (20 mg total) by mouth daily.   spironolactone  (ALDACTONE ) 25 MG tablet 45 tablet 1    Sig: Take 0.5 tablets (12.5 mg total) by mouth daily.   sacubitril -valsartan  (ENTRESTO ) 97-103 MG 180 tablet 1    Sig: Take 1 tablet by mouth 2 (two) times daily.   Varenicline Tartrate, Starter, (CHANTIX STARTING MONTH PAK) 0.5 MG X 11 & 1 MG X 42 TBPK 53 each 0    Sig: 0.5 mg PO daily day 1-3, then BID day 4-7 then 1 tab BID   varenicline (CHANTIX CONTINUING MONTH PAK) 1 MG tablet 60 tablet 1    Sig: Take 1 tablet (1 mg total) by mouth 2 (two) times daily.    Return in about 4 months (around 01/30/2025).  Barnie Louder, MD, FACP

## 2024-09-29 NOTE — Patient Instructions (Addendum)
 Stopped potassium supplement.  It is okay for you to continue taking the furosemide  every other day.  However if you develop any swelling in the legs or shortness of breath, you should take it daily until those symptoms resolve.  I have sent a prescription to your pharmacy for Chantix as discussed.  This will help you quit smoking completely.  Chantix can cause mood swings and vivid dreams.                                                      Contains text generated by [employer].

## 2025-01-30 ENCOUNTER — Ambulatory Visit: Admitting: Internal Medicine

## 2025-02-21 ENCOUNTER — Encounter (HOSPITAL_COMMUNITY)
